# Patient Record
Sex: Female | Born: 1945 | Race: White | Hispanic: No | Smoking: Never smoker
Health system: Southern US, Community
[De-identification: ages and names within clinical notes are randomized; demographics above are authoritative.]

## PROBLEM LIST (undated history)

## (undated) DIAGNOSIS — T8859XA Other complications of anesthesia, initial encounter: Secondary | ICD-10-CM

## (undated) DIAGNOSIS — K5790 Diverticulosis of intestine, part unspecified, without perforation or abscess without bleeding: Secondary | ICD-10-CM

## (undated) DIAGNOSIS — M79605 Pain in left leg: Secondary | ICD-10-CM

## (undated) DIAGNOSIS — M1990 Unspecified osteoarthritis, unspecified site: Secondary | ICD-10-CM

## (undated) DIAGNOSIS — Z9889 Other specified postprocedural states: Secondary | ICD-10-CM

## (undated) DIAGNOSIS — I8390 Asymptomatic varicose veins of unspecified lower extremity: Secondary | ICD-10-CM

## (undated) DIAGNOSIS — E785 Hyperlipidemia, unspecified: Secondary | ICD-10-CM

## (undated) DIAGNOSIS — M79604 Pain in right leg: Secondary | ICD-10-CM

## (undated) DIAGNOSIS — R112 Nausea with vomiting, unspecified: Secondary | ICD-10-CM

## (undated) DIAGNOSIS — C801 Malignant (primary) neoplasm, unspecified: Secondary | ICD-10-CM

## (undated) HISTORY — DX: Hyperlipidemia, unspecified: E78.5

## (undated) HISTORY — DX: Pain in right leg: M79.605

## (undated) HISTORY — DX: Pain in left leg: M79.604

## (undated) HISTORY — DX: Malignant (primary) neoplasm, unspecified: C80.1

## (undated) HISTORY — DX: Asymptomatic varicose veins of unspecified lower extremity: I83.90

## (undated) HISTORY — DX: Diverticulosis of intestine, part unspecified, without perforation or abscess without bleeding: K57.90

---

## 1997-12-21 ENCOUNTER — Other Ambulatory Visit: Admission: RE | Admit: 1997-12-21 | Discharge: 1997-12-21 | Payer: Self-pay | Admitting: Gynecology

## 1998-04-01 ENCOUNTER — Other Ambulatory Visit: Admission: RE | Admit: 1998-04-01 | Discharge: 1998-04-01 | Payer: Self-pay | Admitting: Gynecology

## 1999-03-03 ENCOUNTER — Other Ambulatory Visit: Admission: RE | Admit: 1999-03-03 | Discharge: 1999-03-03 | Payer: Self-pay | Admitting: Gynecology

## 1999-03-04 ENCOUNTER — Ambulatory Visit (HOSPITAL_COMMUNITY): Admission: RE | Admit: 1999-03-04 | Discharge: 1999-03-04 | Payer: Self-pay | Admitting: Gynecology

## 1999-03-04 ENCOUNTER — Encounter (INDEPENDENT_AMBULATORY_CARE_PROVIDER_SITE_OTHER): Payer: Self-pay | Admitting: Specialist

## 1999-09-08 ENCOUNTER — Other Ambulatory Visit: Admission: RE | Admit: 1999-09-08 | Discharge: 1999-09-08 | Payer: Self-pay | Admitting: Gynecology

## 2000-03-08 ENCOUNTER — Other Ambulatory Visit: Admission: RE | Admit: 2000-03-08 | Discharge: 2000-03-08 | Payer: Self-pay | Admitting: Gynecology

## 2000-05-22 HISTORY — PX: ABDOMINAL HYSTERECTOMY: SHX81

## 2000-08-01 ENCOUNTER — Encounter (INDEPENDENT_AMBULATORY_CARE_PROVIDER_SITE_OTHER): Payer: Self-pay

## 2000-08-01 ENCOUNTER — Other Ambulatory Visit: Admission: RE | Admit: 2000-08-01 | Discharge: 2000-08-01 | Payer: Self-pay | Admitting: Gynecology

## 2000-08-31 ENCOUNTER — Encounter (INDEPENDENT_AMBULATORY_CARE_PROVIDER_SITE_OTHER): Payer: Self-pay | Admitting: Specialist

## 2000-08-31 ENCOUNTER — Inpatient Hospital Stay (HOSPITAL_COMMUNITY): Admission: RE | Admit: 2000-08-31 | Discharge: 2000-09-02 | Payer: Self-pay | Admitting: Gynecology

## 2001-03-14 ENCOUNTER — Other Ambulatory Visit: Admission: RE | Admit: 2001-03-14 | Discharge: 2001-03-14 | Payer: Self-pay | Admitting: Gynecology

## 2001-08-20 DIAGNOSIS — C801 Malignant (primary) neoplasm, unspecified: Secondary | ICD-10-CM

## 2001-08-20 HISTORY — DX: Malignant (primary) neoplasm, unspecified: C80.1

## 2001-09-16 ENCOUNTER — Encounter: Payer: Self-pay | Admitting: General Surgery

## 2001-09-18 ENCOUNTER — Encounter (INDEPENDENT_AMBULATORY_CARE_PROVIDER_SITE_OTHER): Payer: Self-pay | Admitting: *Deleted

## 2001-09-18 ENCOUNTER — Inpatient Hospital Stay (HOSPITAL_COMMUNITY): Admission: RE | Admit: 2001-09-18 | Discharge: 2001-09-20 | Payer: Self-pay | Admitting: General Surgery

## 2001-09-18 ENCOUNTER — Encounter: Payer: Self-pay | Admitting: General Surgery

## 2001-09-18 HISTORY — PX: MASTECTOMY: SHX3

## 2002-10-23 ENCOUNTER — Encounter: Payer: Self-pay | Admitting: Gastroenterology

## 2002-10-23 ENCOUNTER — Ambulatory Visit (HOSPITAL_COMMUNITY): Admission: RE | Admit: 2002-10-23 | Discharge: 2002-10-23 | Payer: Self-pay | Admitting: Gastroenterology

## 2002-10-23 ENCOUNTER — Encounter (INDEPENDENT_AMBULATORY_CARE_PROVIDER_SITE_OTHER): Payer: Self-pay

## 2002-12-12 ENCOUNTER — Encounter: Payer: Self-pay | Admitting: Internal Medicine

## 2002-12-25 ENCOUNTER — Ambulatory Visit (HOSPITAL_COMMUNITY): Admission: RE | Admit: 2002-12-25 | Discharge: 2002-12-25 | Payer: Self-pay | Admitting: Gastroenterology

## 2002-12-25 ENCOUNTER — Encounter: Payer: Self-pay | Admitting: Gastroenterology

## 2004-09-15 ENCOUNTER — Ambulatory Visit: Payer: Self-pay | Admitting: Internal Medicine

## 2004-09-30 ENCOUNTER — Ambulatory Visit: Payer: Self-pay | Admitting: Internal Medicine

## 2004-10-10 ENCOUNTER — Ambulatory Visit: Payer: Self-pay | Admitting: Cardiology

## 2004-10-10 IMAGING — CT CT CHEST W/O CM
1 of 2 series · 15 of 32 positions shown, 19 images · non-contrast
Comparison: none

CLINICAL DATA: Cough for the past month. Shortness of breath. Clinical
diagnosis of bronchitis and pneumonia.

CHEST CT WITHOUT CONTRAST

[Series 2: chest_routine 5.0 b40f st · axial · 0.67mm/px · z∈[-263,-53]mm · 15 of 50 slices shown, 19 images]
[im 4/50  mediastinal]
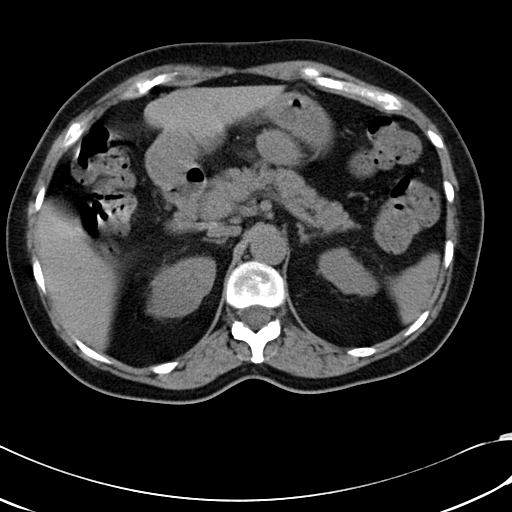
[im 4/50  lung]
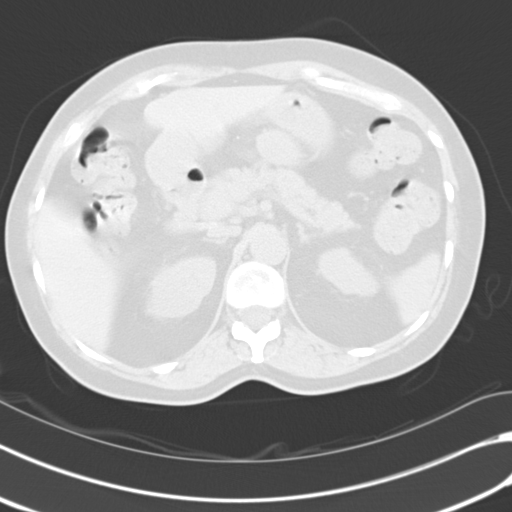
[im 7/50  lung]
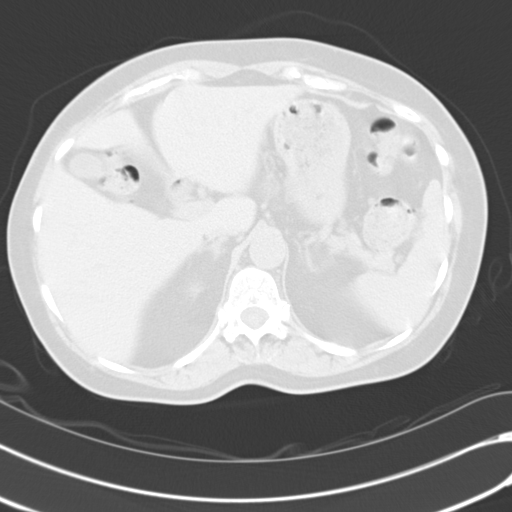
[im 10/50  lung]
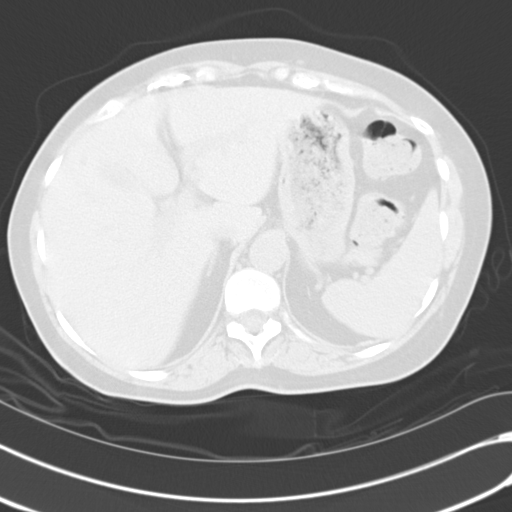
[im 14/50  lung]
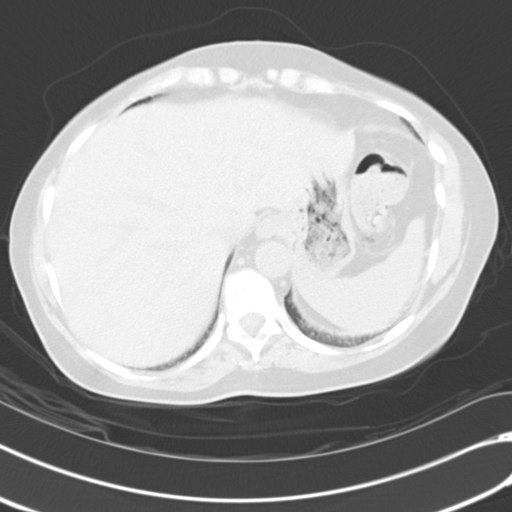
[im 17/50  mediastinal]
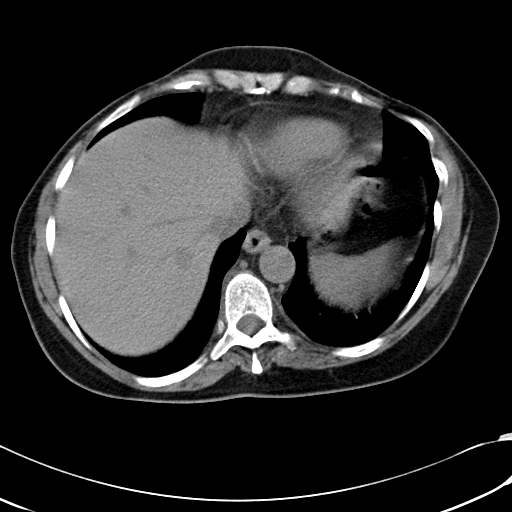
[im 17/50  lung]
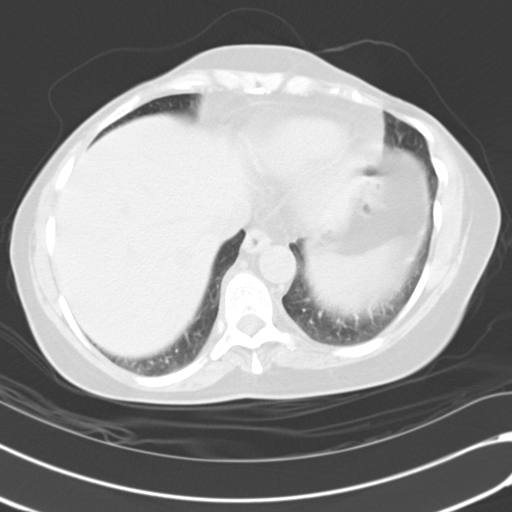
[im 20/50  lung]
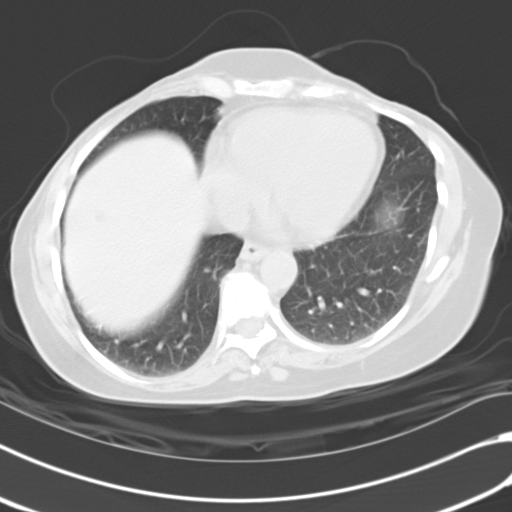
[im 23/50  lung]
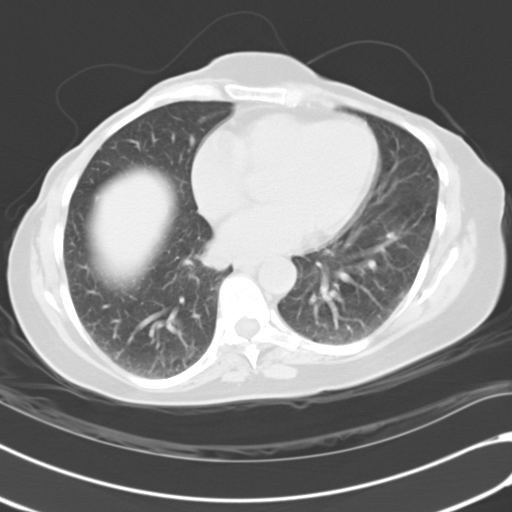
[im 25/50  lung]
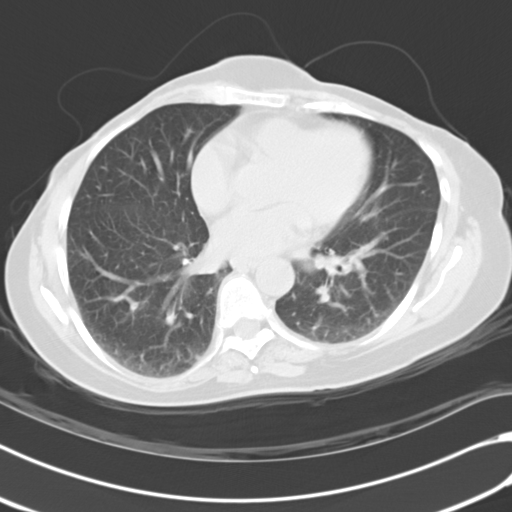
[im 27/50  mediastinal]
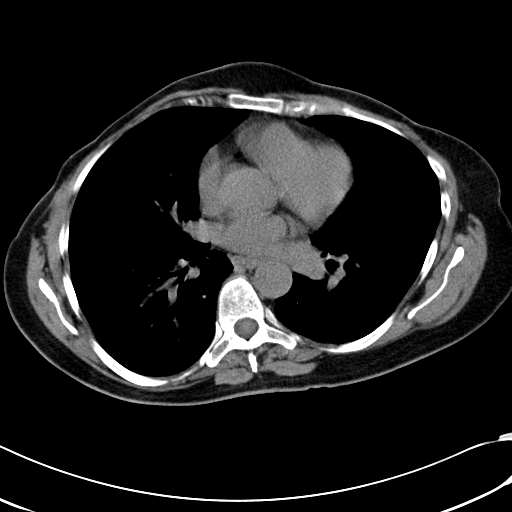
[im 27/50  lung]
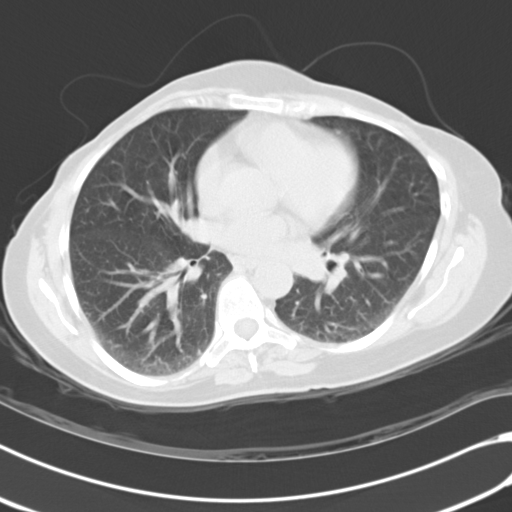
[im 30/50  lung]
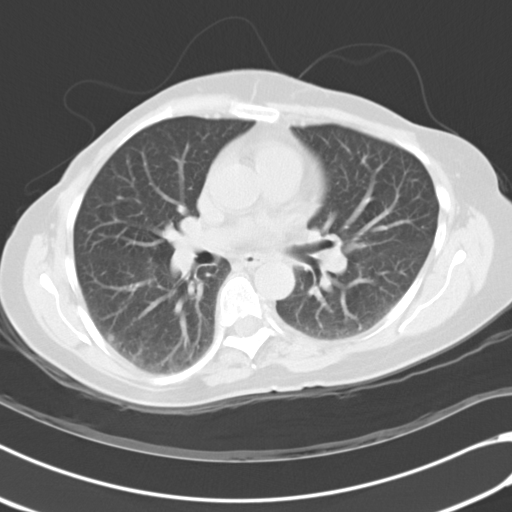
[im 33/50  lung]
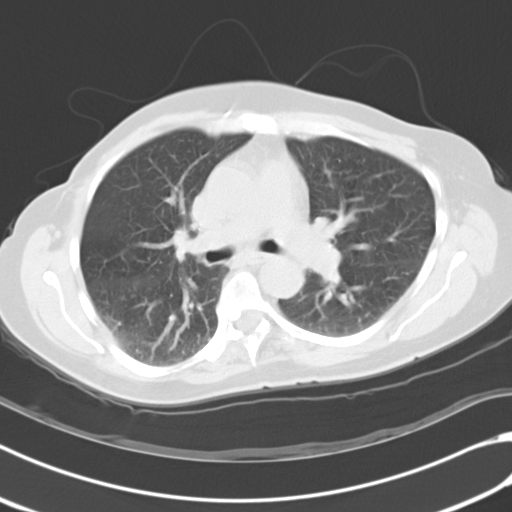
[im 36/50  lung]
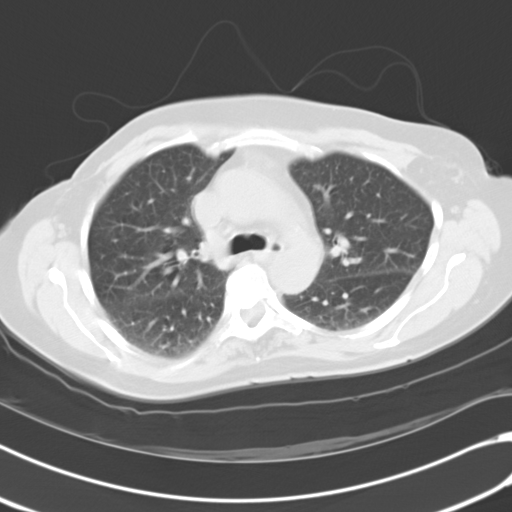
[im 40/50  mediastinal]
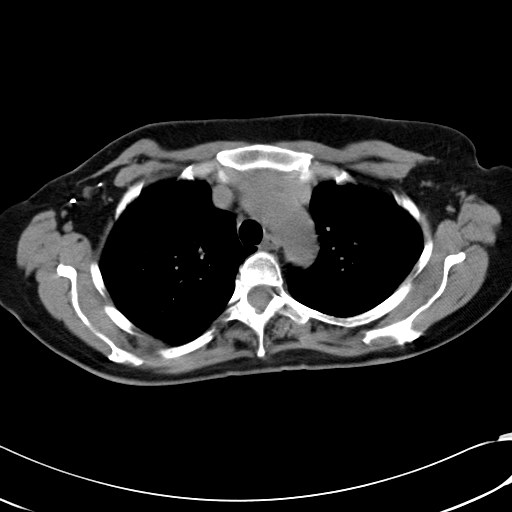
[im 40/50  lung]
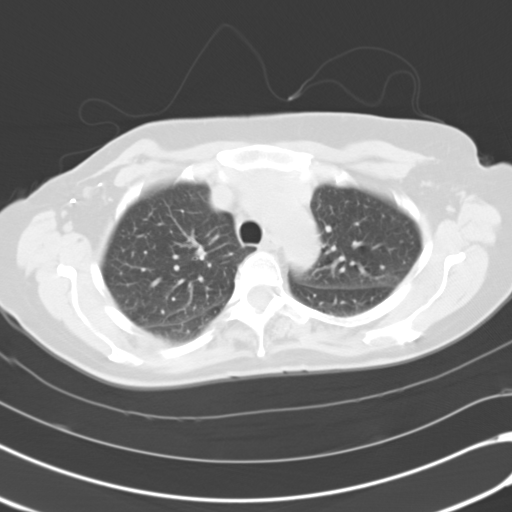
[im 43/50  lung]
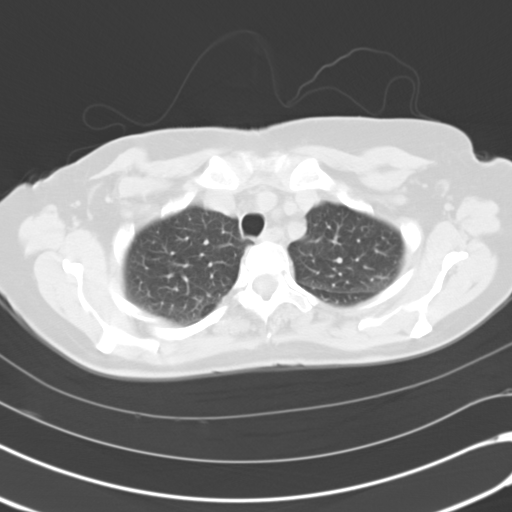
[im 46/50  lung]
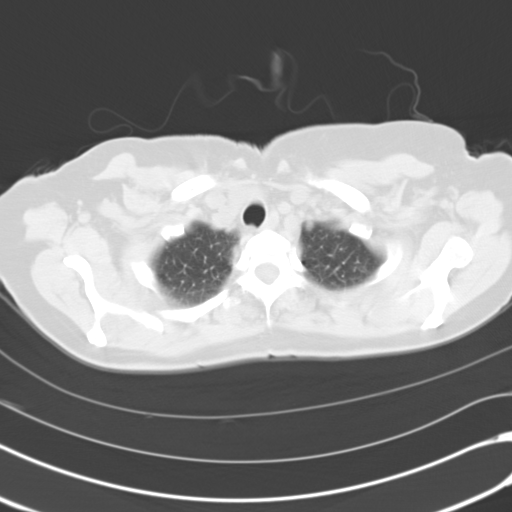

[15 of 32 positions shown; findings below may reference images not displayed]

FINDINGS: No areas of airspace consolidation. 5 mm noncalcified nodule in the
periphery of the right lower lobe. No enlarged lymph nodes. 8mm left thyroid
nodule. 1.3 cm mass in the upper liver on the right. Additional 9 mm mass in the
dome of the liver on the right. Additional 6 mm mass the liver adjacent to the
gallbladder fundus. 2.8 x 1.6 cm upper pole left renal mass, seen on the last
image, not included in its entirety. 12 x 5 mm calcification posterior to the T5
vertebral body. This appears to represent an inferiorly migrated calcified disc
herniation arising from the T4-T5 disc. A small to1 moderate sized calcified
disc herniation on the right at the T5-T6 level. Three small posterior calcified
disc herniations at the T6-T7 level, one centrally, one on the left and one on
the right. Small calcified disc herniation on the left at the T7-T8 level. Small
central calcified disc herniation at the T8-T9 level. Mild scoliosis.

IMPRESSION

1. Left renal mass and three liver masses. These are incompletely characterized
on this noncontrast CT examination.A CT of the abdomen and pelvis, with
intravenous and oral contrast, is recommended.

2. 5 mm noncalcified right lower lobe nodule. This most likely represents a
noncalcified granuloma. As a precaution, a followup chest CT is recommended in 3
months to assess stability.

3. 8mm nonspecific left lobe thyroid nodule. A baseline thyroid ultrasound is
recommended. 

4. No pneumonia seen. 

5. Multiple thoracic disc herniations, as described above. The largest is
located centrally and on the right at the T4-T5 level.

## 2004-10-11 ENCOUNTER — Ambulatory Visit: Payer: Self-pay | Admitting: Internal Medicine

## 2004-10-11 IMAGING — CT CT PELVIS W/ CM
3 of 7 series · 12 of 32 positions shown, 17 images · IV contrast (omnipaque)
Comparison: CT of the chest from 10/10/04.

CLINICAL DATA: Breast ca; a recent CT of the chest suggested renal mass and possible liver lesions
TECHNIQUE: Originally, noncontrast CT images were obtained from the lung bases through the iliac crest.  Subsequently, intravenous administration of 100 cc of Omnipaque 300 IV contrast was performed and images were again obtained from the lung bases through the iliac crests.  Delayed phase images through the kidneys were also obtained.  
 CT ABDOMEN WITH AND WITHOUT CONTRAST:
TECHNIQUE: Contiguous axial CT images were obtained from the iliac crest to the proximal femurs.
 CT OF THE PELVIS WITH CONTRAST:
 There is some prominent posterior bony spurring at the L4-5 level.  No pelvic adenopathy is evident.  The urinary bladder is empty during imaging.  Visualized bowel appears unremarkable.  No free pelvic fluid.  Uterus is surgically absent.  Likewise the ovaries are not readily apparent.

[Series 2: non_con 5.0 b30f st · axial · 0.63mm/px · z∈[-200,-70]mm · 3 of 54 slices shown]
[im 14/54  soft-tissue]
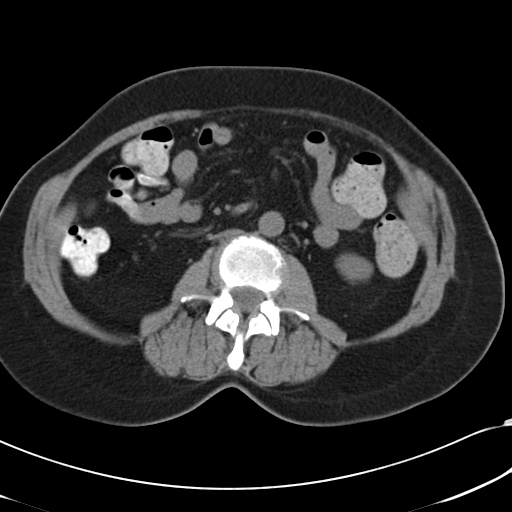
[im 27/54  soft-tissue]
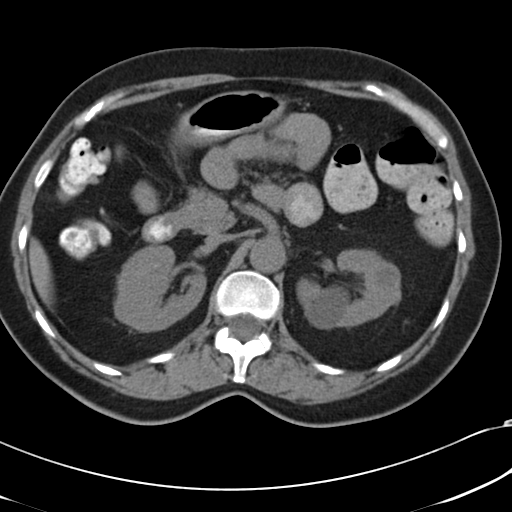
[im 40/54  soft-tissue]
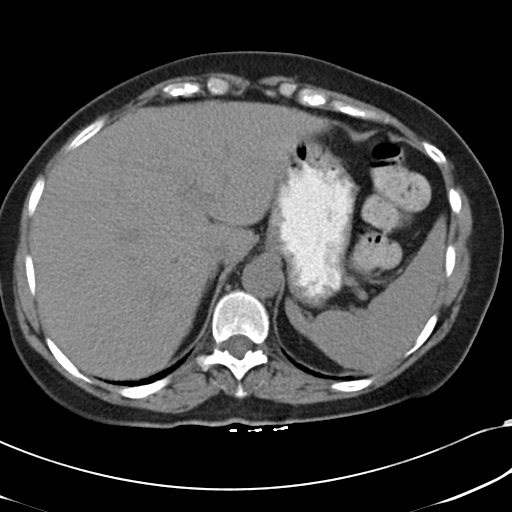

[Series 3: abd_pelvis 5.0 b30f st · axial · 0.63mm/px · z∈[-386,-56]mm · 7 of 88 slices shown, 12 images]
[im 11/88  soft-tissue]
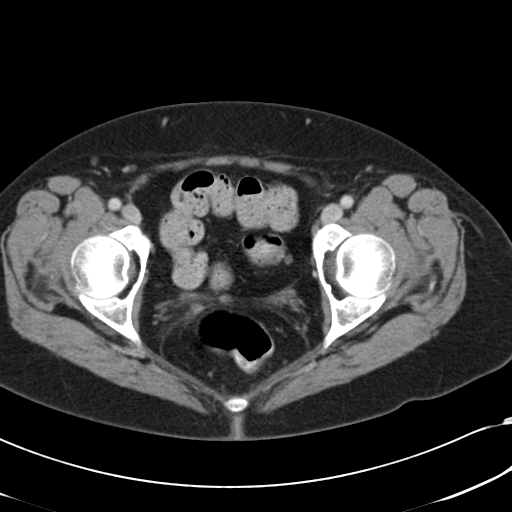
[im 11/88  bone]
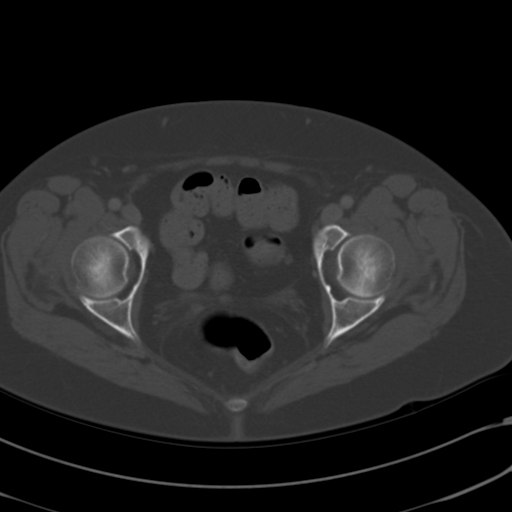
[im 22/88  soft-tissue]
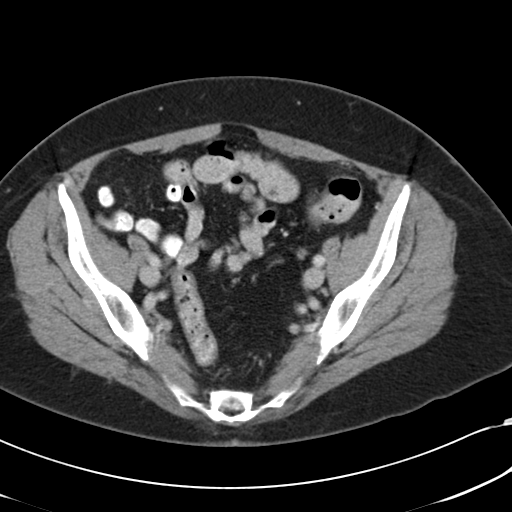
[im 33/88  soft-tissue]
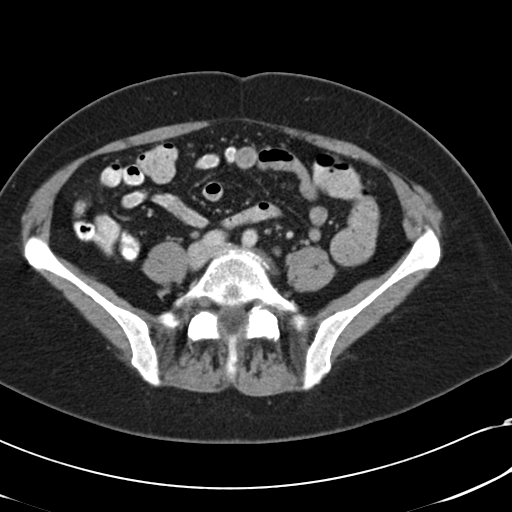
[im 44/88  soft-tissue]
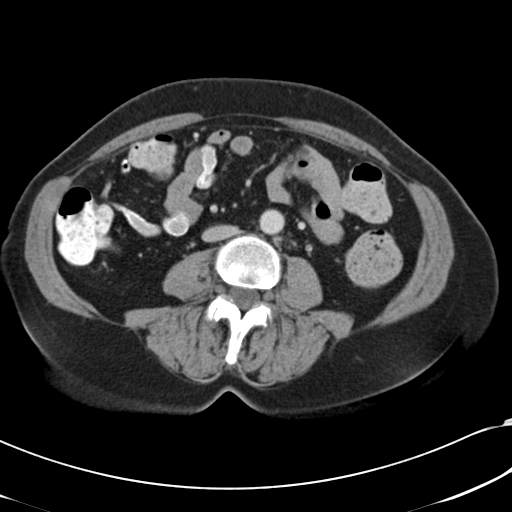
[im 44/88  lung]
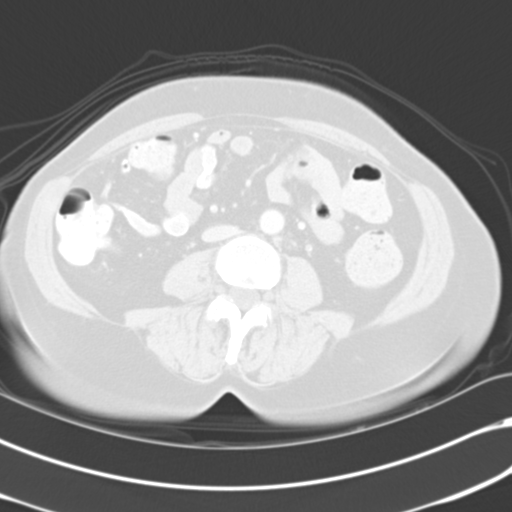
[im 55/88  soft-tissue]
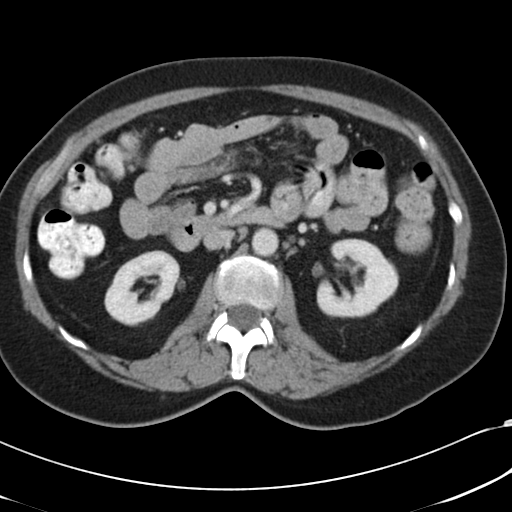
[im 55/88  lung]
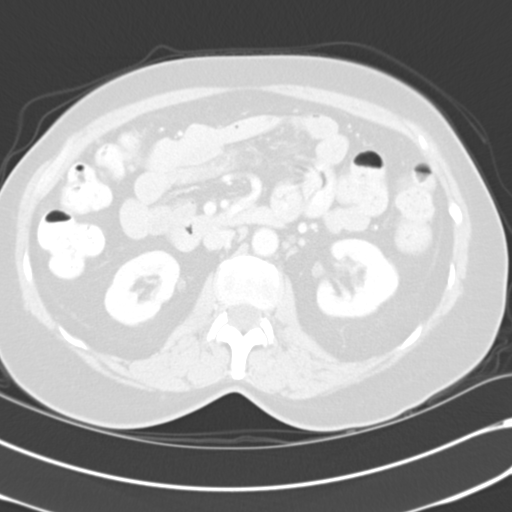
[im 66/88  soft-tissue]
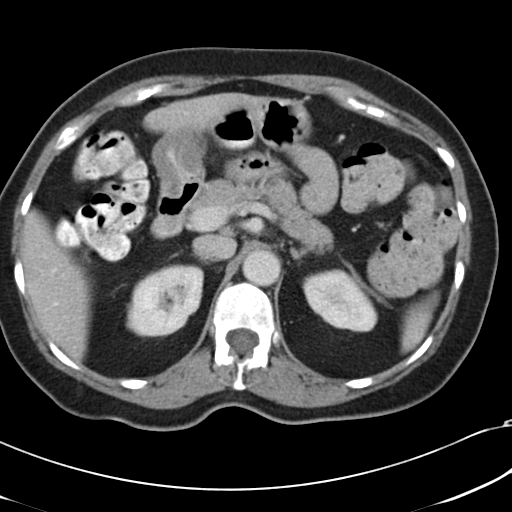
[im 66/88  lung]
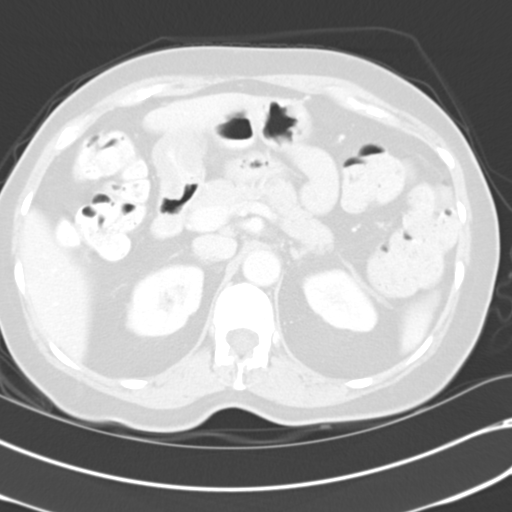
[im 77/88  soft-tissue]
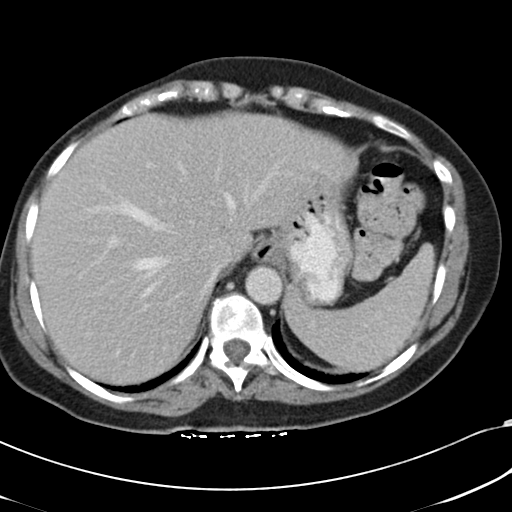
[im 77/88  lung]
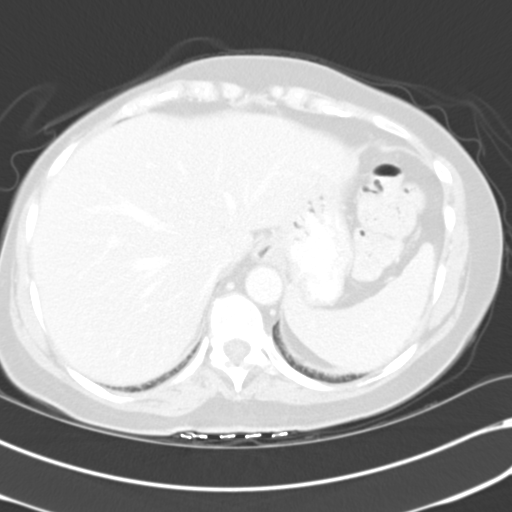

[Series 5: kidney_delay 5.0 b30f st · axial · 0.63mm/px · z∈[-150,-86]mm · 2 of 40 slices shown]
[im 14/40  soft-tissue]
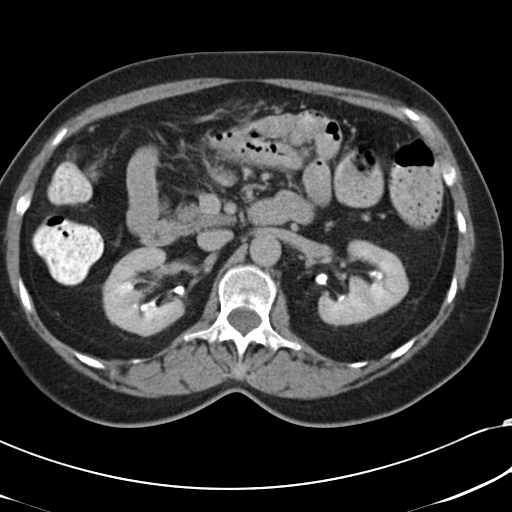
[im 27/40  soft-tissue]
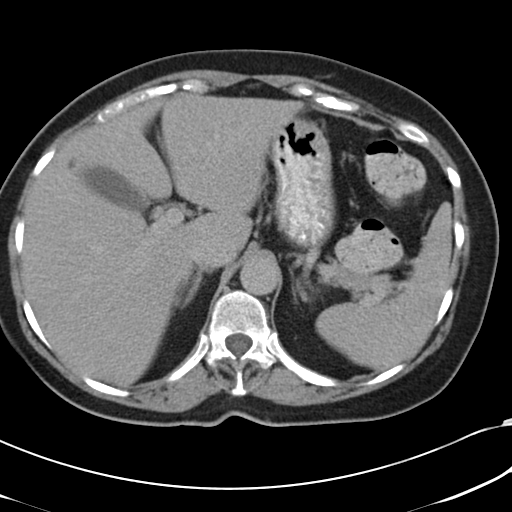

[12 of 32 positions shown; findings below may reference images not displayed]

In the dome of the right hepatic lobe, there is a 6 mm hypodense lesion with no associated enhancement, likely representing a cyst. 
 More posteriorly and as shown on image 8 of series 3, there is a 14 mm complex lesion in the right hepatic lobe measuring approximately 50 Hounsfield units on pre-contrast, portal venous phase, and delayed phase images.  Thus, this likely represents a complex cyst given the lack of obvious enhancement, although given the patient?s history of this lesion likely warrants being carefully followed. 
 The spleen and adrenal glands appear unremarkable.  
 The lesion of previous concern on the noncontrast chest CT in the left kidney is noted to represent a 3.4 x 2.1 cm simple cyst.  There is also a separate 1.6 x 1.2 cm cyst in the left kidney.  This lesion is relatively flat and is subject to volume averaging, although most likely represents a cyst based on density measurements.  
 The pancreas appears unremarkable.  No pathologic retroperitoneal adenopathy.  Visualized bowel likewise appears unremarkable.
IMPRESSION: 1.  Left renal lesions have benign imaging characteristics.
 2.  The liver lesions also have benign imaging characteristics.  One of these is a complex lesion, but does not appear to have significant difference in density on pre and post contrast images and thus likely represents a complex cyst.  This latter lesion warrants careful follow-up.
IMPRESSION: No acute pelvic findings.

## 2004-11-29 ENCOUNTER — Encounter (HOSPITAL_COMMUNITY): Admission: RE | Admit: 2004-11-29 | Discharge: 2005-02-27 | Payer: Self-pay | Admitting: Neurosurgery

## 2004-12-01 ENCOUNTER — Ambulatory Visit: Payer: Self-pay | Admitting: Internal Medicine

## 2004-12-13 ENCOUNTER — Encounter (INDEPENDENT_AMBULATORY_CARE_PROVIDER_SITE_OTHER): Payer: Self-pay | Admitting: *Deleted

## 2004-12-13 ENCOUNTER — Encounter: Admission: RE | Admit: 2004-12-13 | Discharge: 2004-12-13 | Payer: Self-pay | Admitting: Internal Medicine

## 2004-12-13 ENCOUNTER — Other Ambulatory Visit: Admission: RE | Admit: 2004-12-13 | Discharge: 2004-12-13 | Payer: Self-pay | Admitting: Interventional Radiology

## 2004-12-13 IMAGING — US US BIOPSY
1 series · 8 of 8 positions shown · non-contrast
Comparison: none

CLINICAL DATA: Dominant left lower pole thyroid nodule. History of breast
carcinoma.

ULTRASOUND GUIDED FNA BIOPSY THYROID:
TECHNIQUE: Overlying skin prepped with Betadine, draped in usual sterile
fashion, and infiltrated locally with 1% lidocaine. Under real-time ultrasound
guidance, 4 passes were made into the left lower pole nodule using 25 gauge
needles. Samples were given to cytopathology. No immediate complication.

[Series 1: unknown · 0.07mm/px · 8 of 8 slices shown]
[im 1/8]
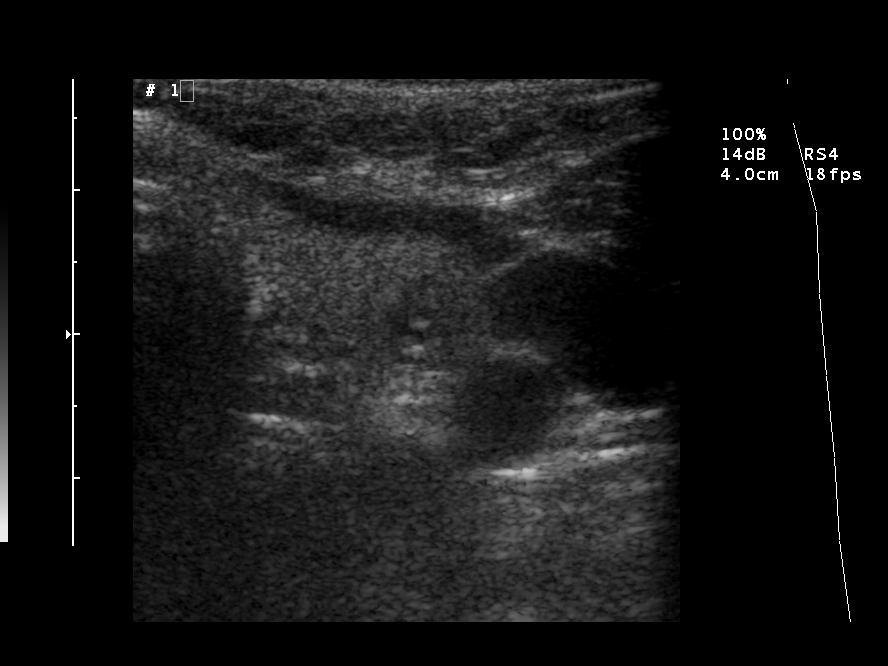
[im 2/8]
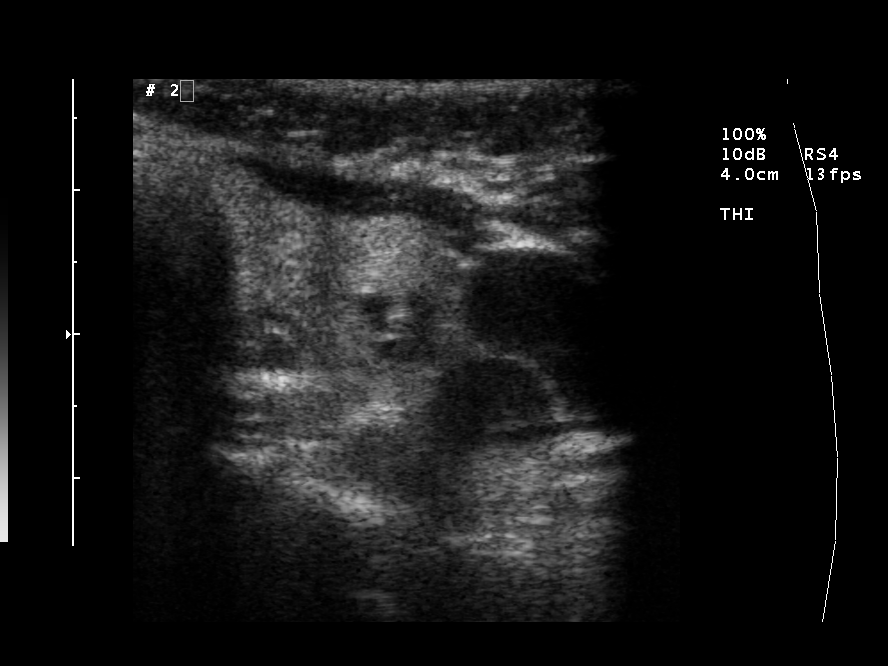
[im 3/8]
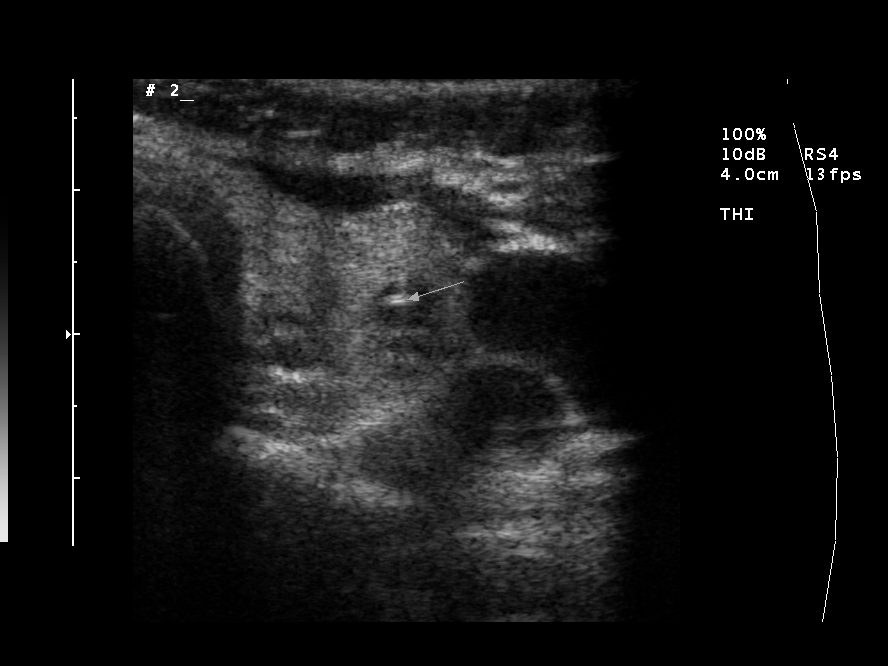
[im 4/8]
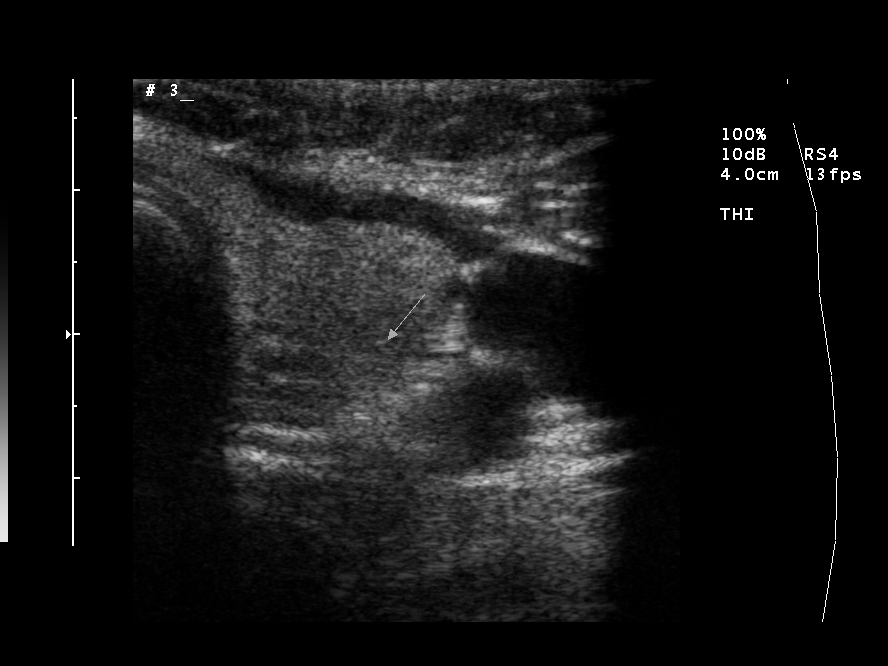
[im 5/8]
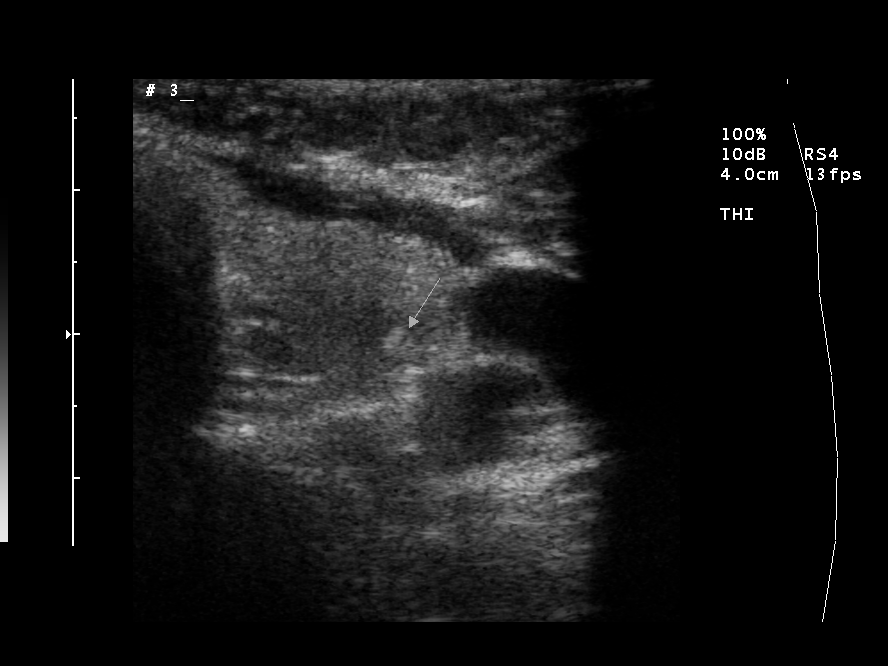
[im 6/8]
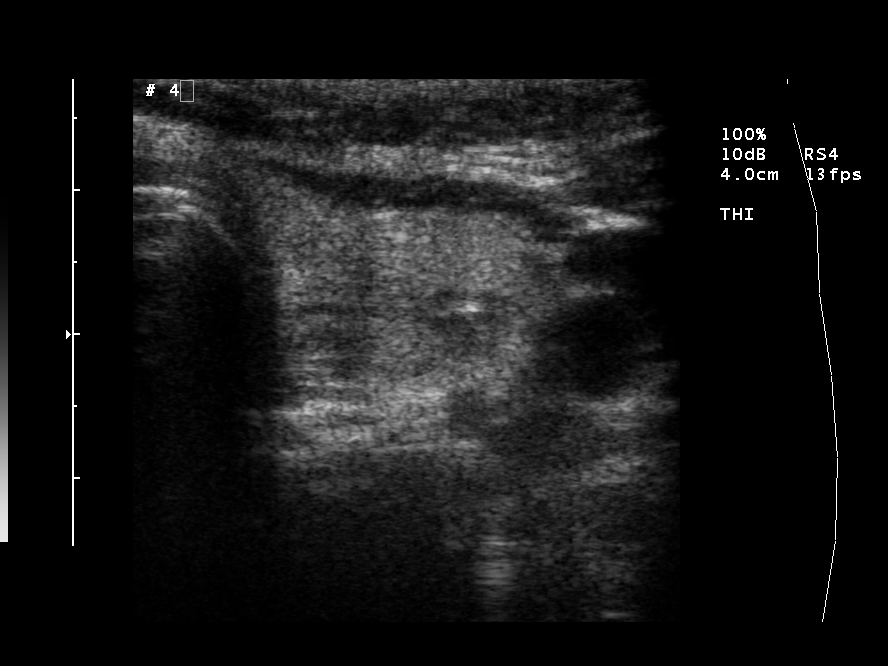
[im 7/8]
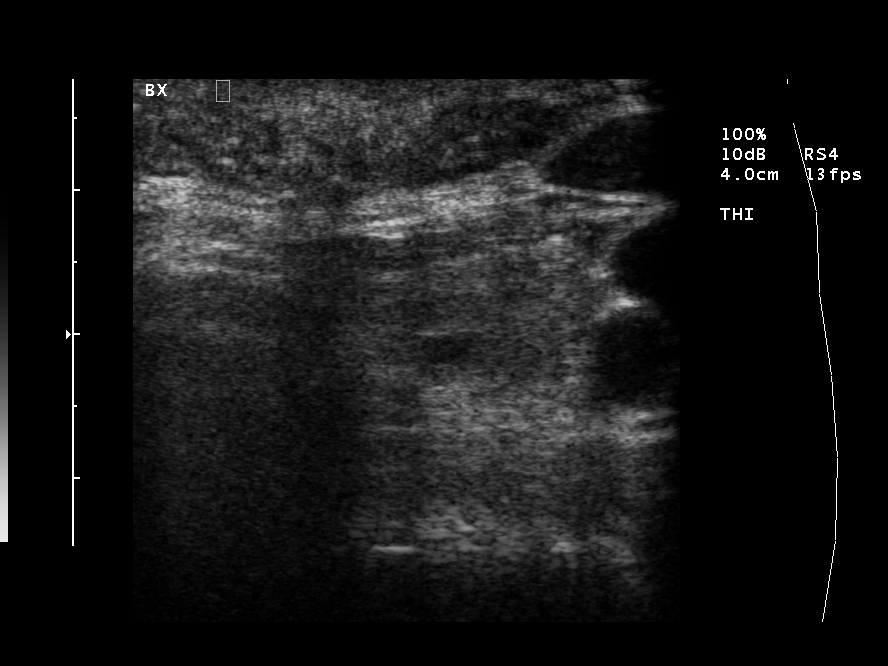
[im 8/8]
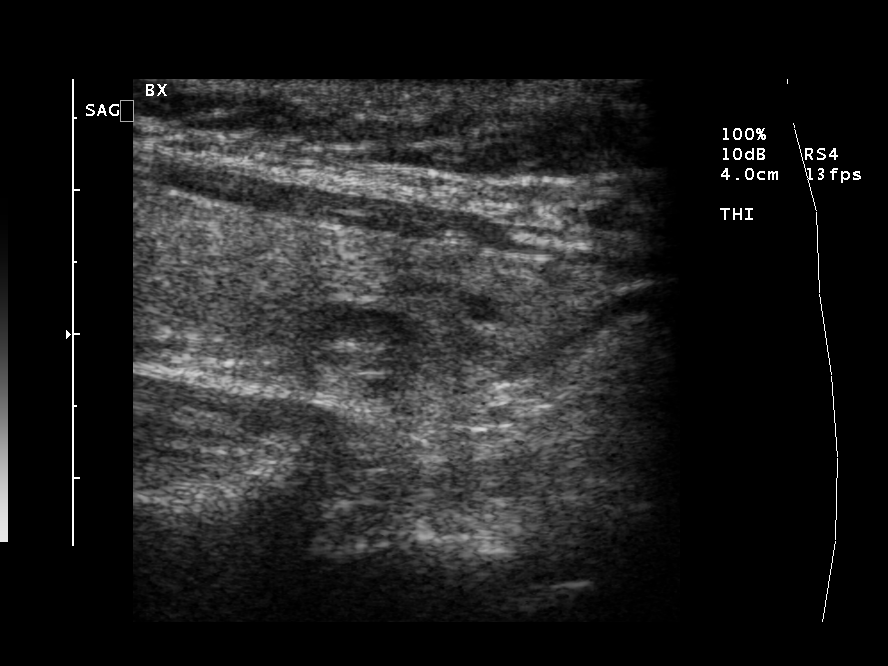

[8 of 8 positions shown; findings below may reference images not displayed]

IMPRESSION: Technically successful ultrasound guided FNA thyroid biopsy.

## 2005-02-23 ENCOUNTER — Ambulatory Visit: Payer: Self-pay | Admitting: Internal Medicine

## 2005-03-02 ENCOUNTER — Ambulatory Visit: Payer: Self-pay | Admitting: Internal Medicine

## 2005-03-06 ENCOUNTER — Ambulatory Visit: Payer: Self-pay | Admitting: Internal Medicine

## 2005-03-06 IMAGING — CT CT PELVIS W/ CM
1 of 3 series · 14 of 32 positions shown, 19 images · non-contrast
Comparison: none

CLINICAL DATA: Renal and hepatic cysts on previous study.

[Series 2: abd_pel 5.0 b30f st · axial · 0.68mm/px · z∈[-426,-26]mm · 14 of 90 slices shown, 19 images]
[im 5/90  soft-tissue]
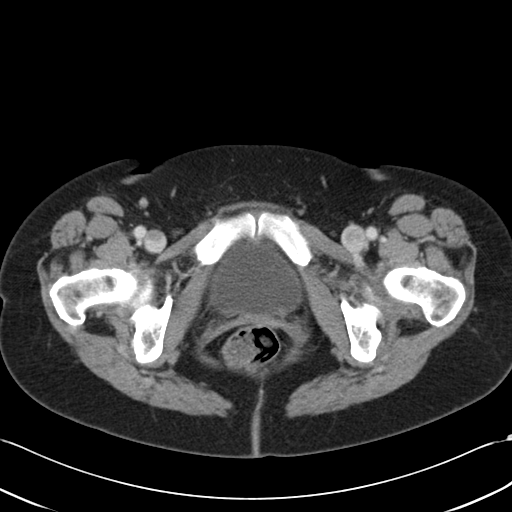
[im 5/90  bone]
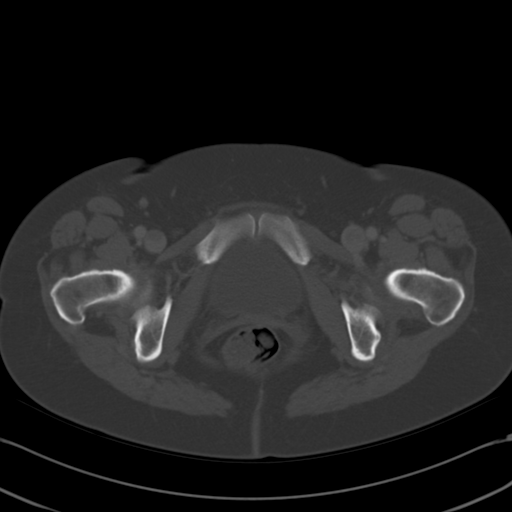
[im 15/90  soft-tissue]
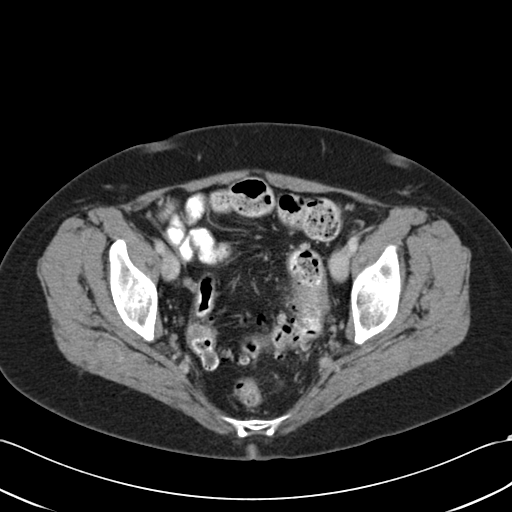
[im 19/90  soft-tissue]
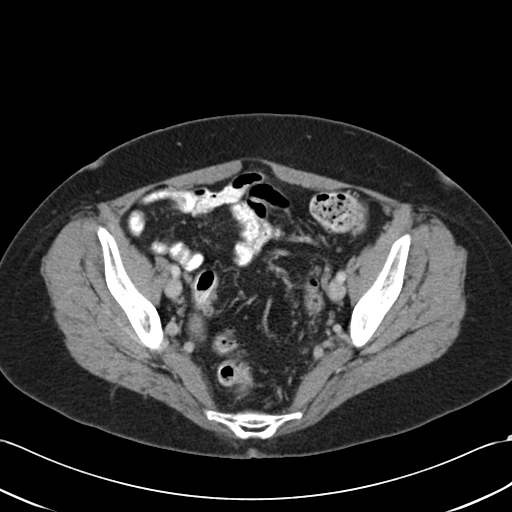
[im 24/90  soft-tissue]
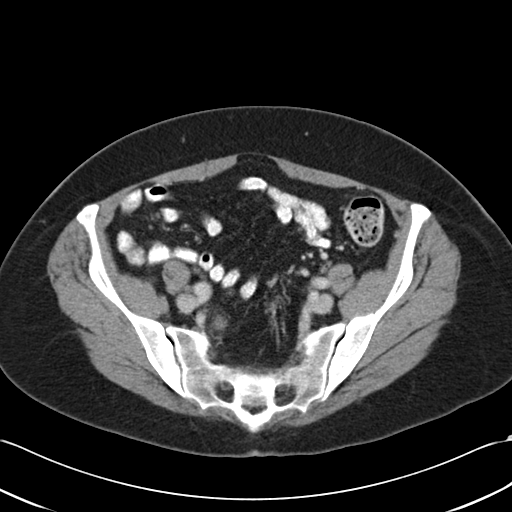
[im 33/90  soft-tissue]
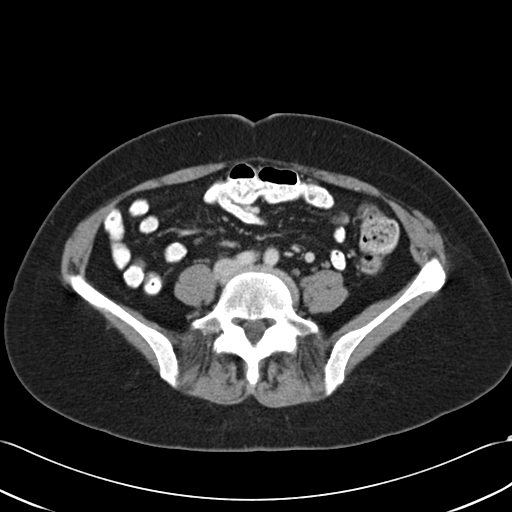
[im 38/90  soft-tissue]
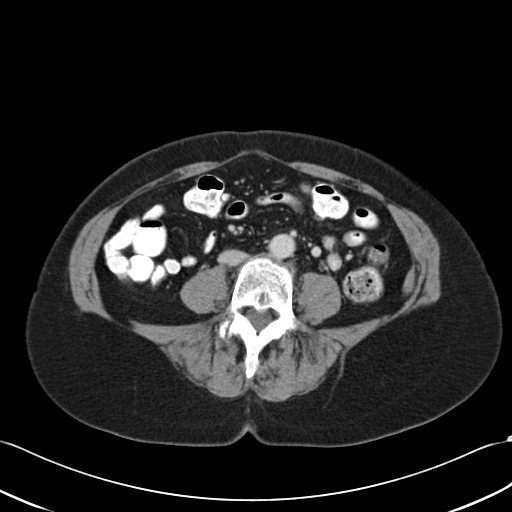
[im 47/90  soft-tissue]
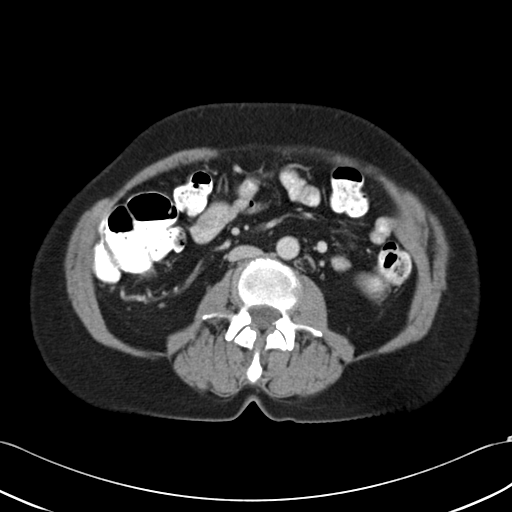
[im 52/90  soft-tissue]
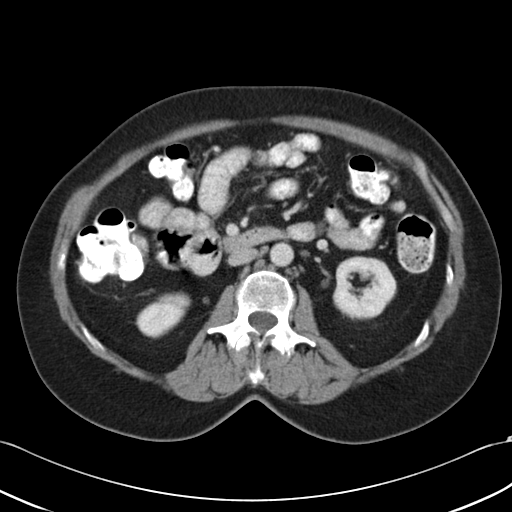
[im 57/90  soft-tissue]
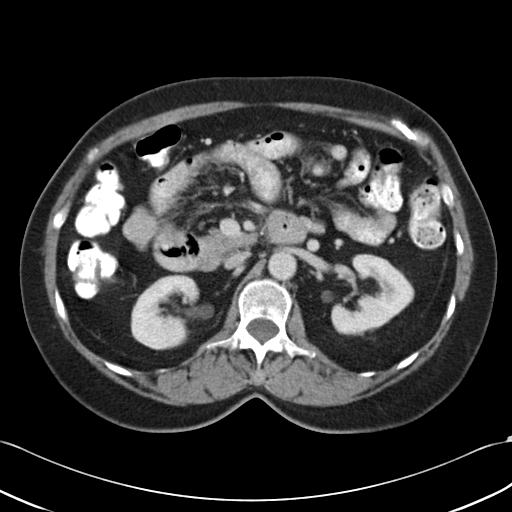
[im 57/90  bone]
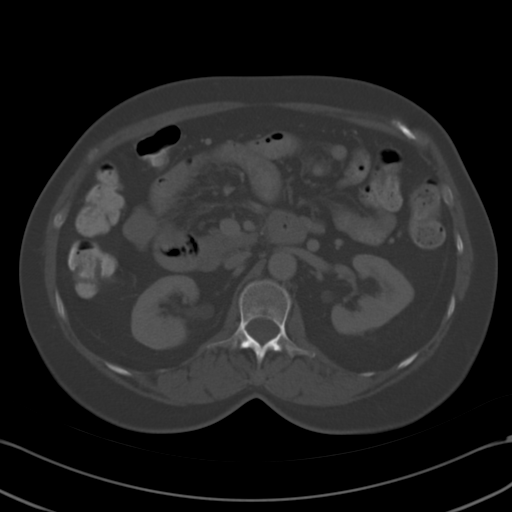
[im 66/90  soft-tissue]
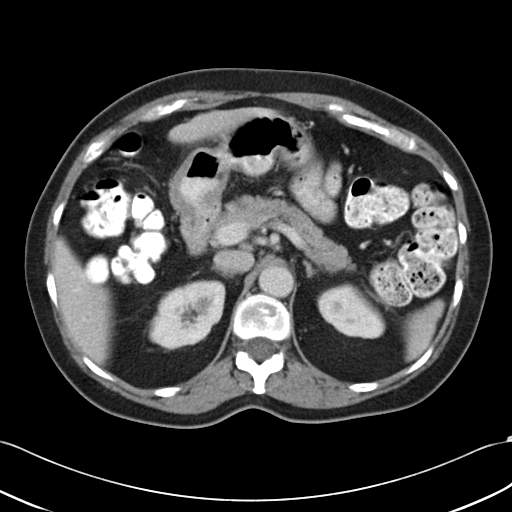
[im 71/90  soft-tissue]
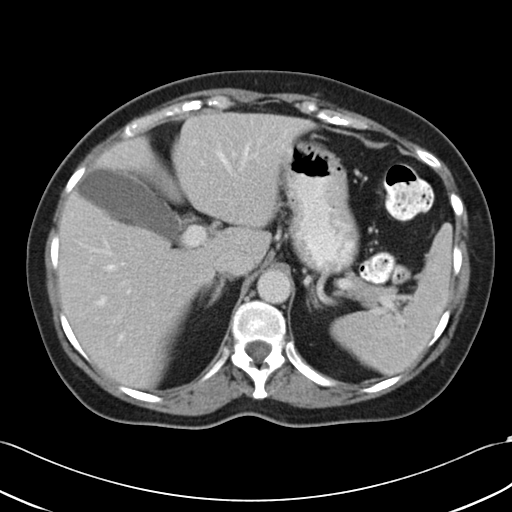
[im 71/90  lung]
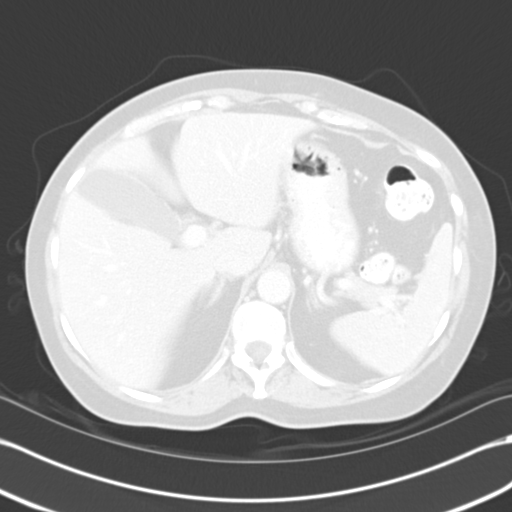
[im 75/90  soft-tissue]
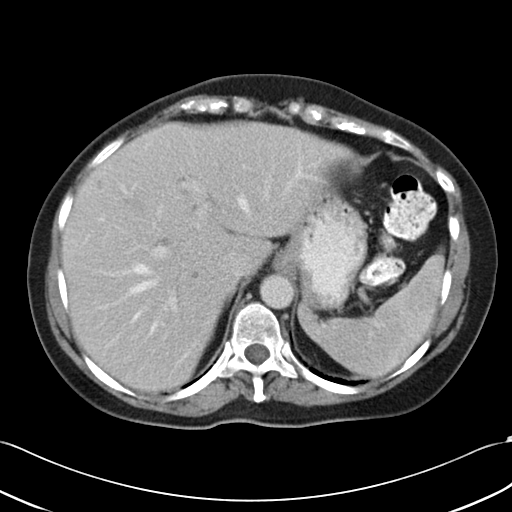
[im 75/90  lung]
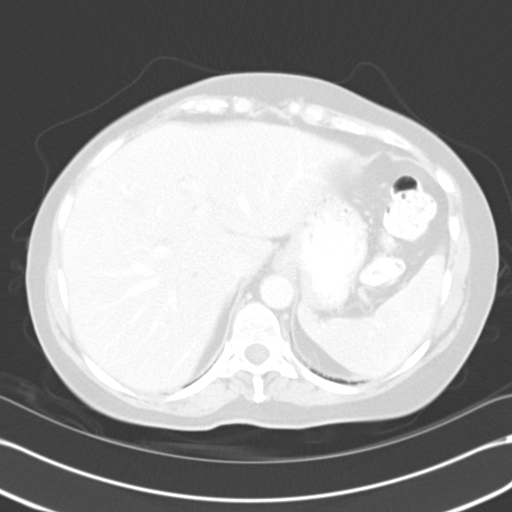
[im 80/90  lung]
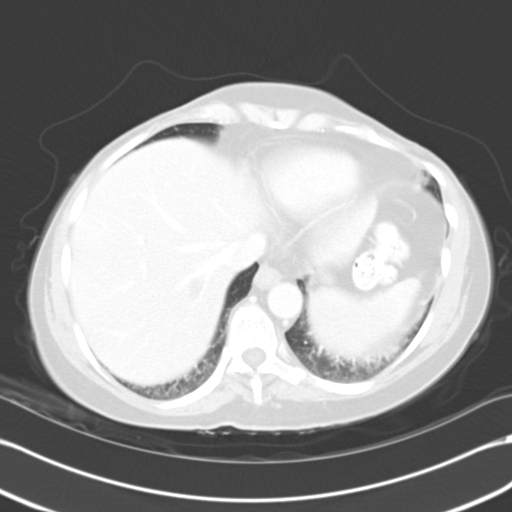
[im 85/90  soft-tissue]
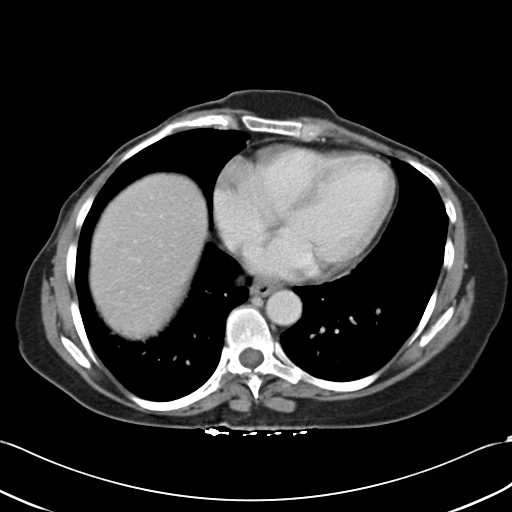
[im 85/90  lung]
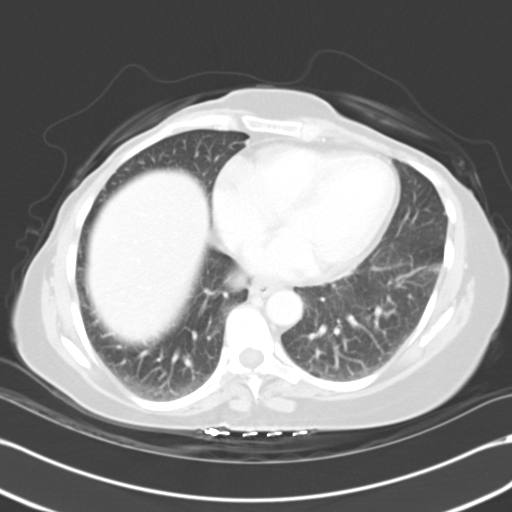

[14 of 32 positions shown; findings below may reference images not displayed]

CT abdomen with contrast:

Multidetector helical CT after 100 ml Gmnipaque-6JJ IV.

Comparison 10/11/2004. [DATE] mm subpleural nodule in the lateral basal segment left
lower lobe stable. Multiple low attenuation hepatic lesions stable in size,  
number and appearance. Largest is in the posterior right hepatic segment
measuring 15 millimeters in maximum   transverse diameter. No new lesions are
seen. Unremarkable spleen, adrenal glands, pancreas, gallbladder. Left renal
cysts, largest 3.4 cm diameter in the interpolar region, stable. No
hydronephrosis. Aorta normal in caliber. Subcentimeter left periaortic and
aortocaval lymph nodes. Portal vein patent. Small bowel unremarkable. No free
air. No ascites. Degenerative changes in the lower lumbar spine.
IMPRESSION: 1. Stable hepatic and renal cysts.
2. Stable subcentimeter subpleural nodule in the lateral basal segment left
lower lobe.

CT pelvis with contrast:

Colon is nondistended. Scattered diverticula from distal descending and sigmoid
portions of colon. No extraluminal fluid collections are identified. No free
fluid. Urinary bladder physiologically distended. Uterus surgically absent.
IMPRESSION: 1. Colonic diverticulosis. 
2. Otherwise unremarkable CT pelvis

## 2005-03-16 ENCOUNTER — Ambulatory Visit: Payer: Self-pay | Admitting: Family Medicine

## 2005-04-27 ENCOUNTER — Ambulatory Visit: Payer: Self-pay | Admitting: Internal Medicine

## 2005-05-04 ENCOUNTER — Ambulatory Visit: Payer: Self-pay | Admitting: Internal Medicine

## 2005-07-06 ENCOUNTER — Ambulatory Visit: Payer: Self-pay | Admitting: Internal Medicine

## 2005-09-14 ENCOUNTER — Ambulatory Visit: Payer: Self-pay | Admitting: Internal Medicine

## 2006-01-04 ENCOUNTER — Ambulatory Visit: Payer: Self-pay | Admitting: Internal Medicine

## 2006-01-15 ENCOUNTER — Ambulatory Visit: Payer: Self-pay | Admitting: Internal Medicine

## 2006-02-15 ENCOUNTER — Ambulatory Visit: Payer: Self-pay | Admitting: Internal Medicine

## 2006-03-08 ENCOUNTER — Ambulatory Visit: Payer: Self-pay | Admitting: Internal Medicine

## 2006-03-08 LAB — CONVERTED CEMR LAB
ALT: 8 units/L (ref 0–40)
Albumin: 3.6 g/dL (ref 3.5–5.2)
Alkaline Phosphatase: 65 units/L (ref 39–117)
Basophils Relative: 0.7 % (ref 0.0–1.0)
Cholesterol: 167 mg/dL (ref 0–200)
GFR calc non Af Amer: 78 mL/min
Glomerular Filtration Rate, Af Am: 94 mL/min/{1.73_m2}
Glucose, Bld: 97 mg/dL (ref 70–99)
LDL Cholesterol: 106 mg/dL — ABNORMAL HIGH (ref 0–99)
Platelets: 310 10*3/uL (ref 150–400)
Potassium: 4.7 meq/L (ref 3.5–5.1)
RBC: 4.62 M/uL (ref 3.87–5.11)
Sodium: 147 meq/L — ABNORMAL HIGH (ref 135–145)
Total Bilirubin: 0.6 mg/dL (ref 0.3–1.2)
Total Protein: 6 g/dL (ref 6.0–8.3)
VLDL: 13 mg/dL (ref 0–40)
WBC: 5.2 10*3/uL (ref 4.5–10.5)

## 2006-03-15 ENCOUNTER — Ambulatory Visit: Payer: Self-pay | Admitting: Internal Medicine

## 2006-03-16 ENCOUNTER — Ambulatory Visit: Payer: Self-pay | Admitting: Internal Medicine

## 2006-03-16 IMAGING — CT CT PELVIS W/ CM
2 of 5 series · 16 of 46 positions shown, 18 images · IV contrast (omnipaque)
Comparison: CT chest dated 10/14/04.
COMPARISON: 03/06/05.

CLINICAL DATA: 60 year-old with history of breast cancer. Evaluate for renal mass and possible liver lesion.
CHEST CT WITH CONTRAST:
TECHNIQUE: Multidetector CT imaging of the chest was performed following the standard protocol during bolus administration of intravenous contrast.
Contrast:  125 cc Omnipaque 300
TECHNIQUE: Multidetector CT imaging of the abdomen was performed following the standard protocol during bolus administration of intravenous contrast.
TECHNIQUE: Multidetector CT imaging of the pelvis was performed following the standard protocol during bolus administration of intravenous contrast.

[Series 2: cap 5.0 b40f st · axial · 0.73mm/px · z∈[-598,-74]mm · 13 of 119 slices shown, 15 images]
[im 7/119  soft-tissue]
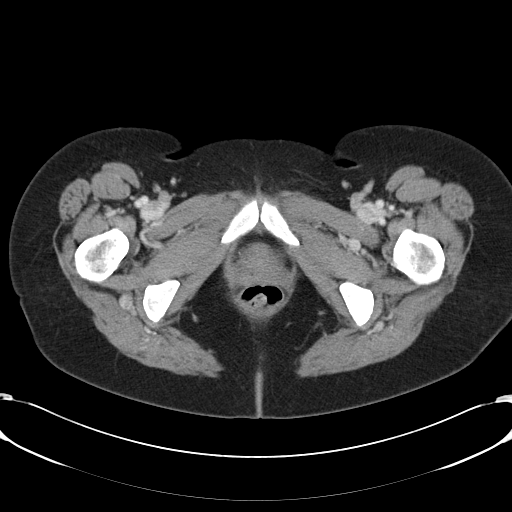
[im 7/119  bone]
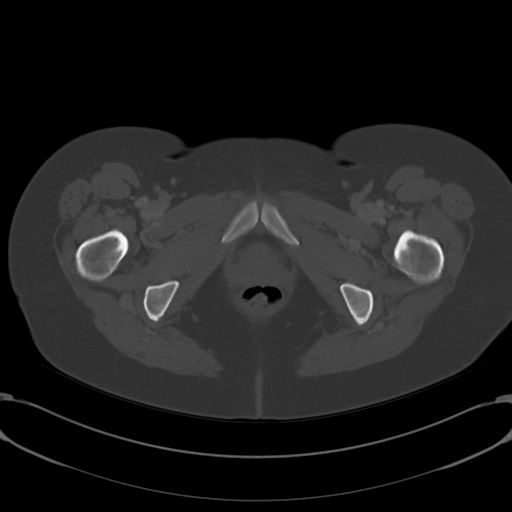
[im 14/119  soft-tissue]
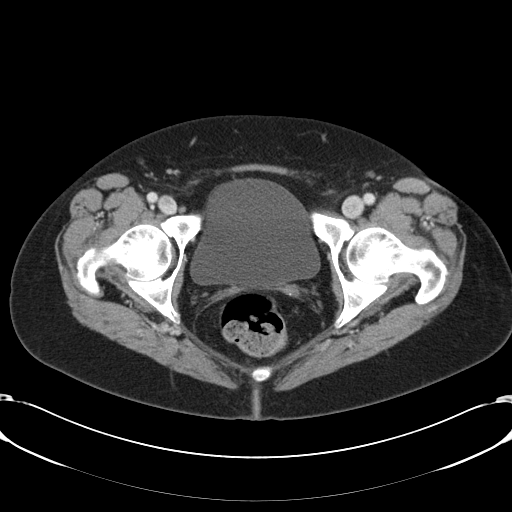
[im 27/119  soft-tissue]
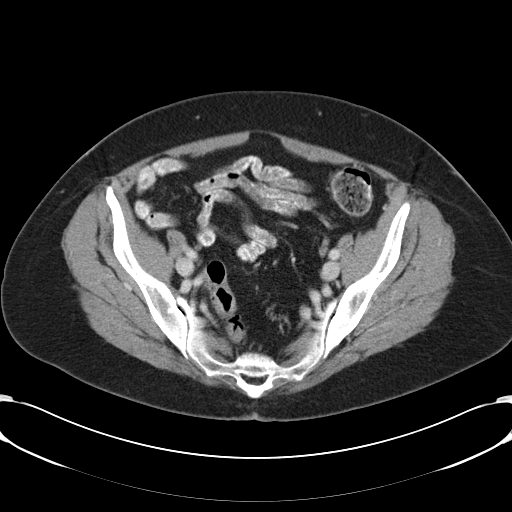
[im 33/119  soft-tissue]
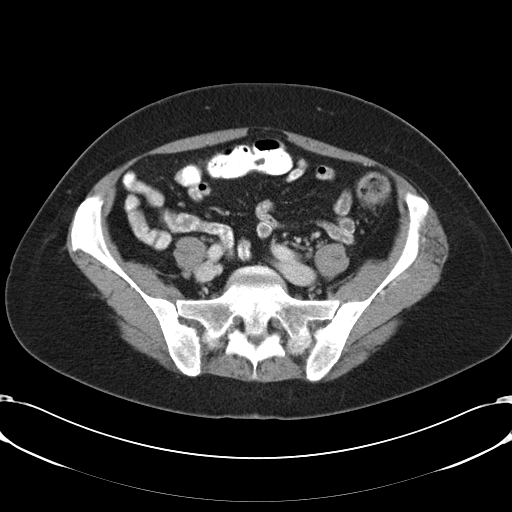
[im 40/119  soft-tissue]
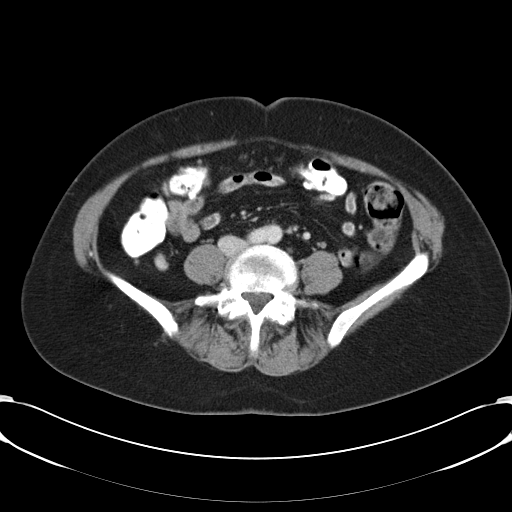
[im 53/119  soft-tissue]
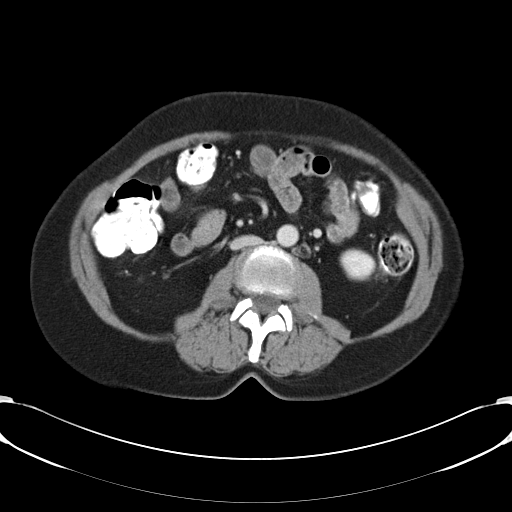
[im 60/119  soft-tissue]
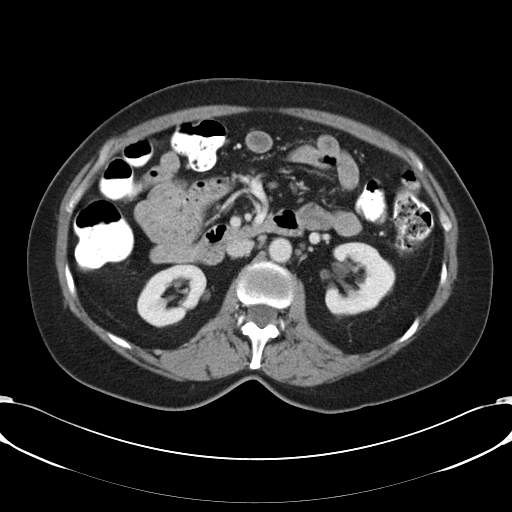
[im 66/119  soft-tissue]
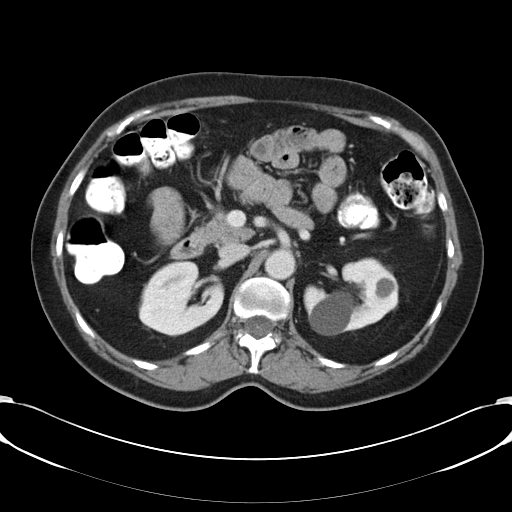
[im 79/119  soft-tissue]
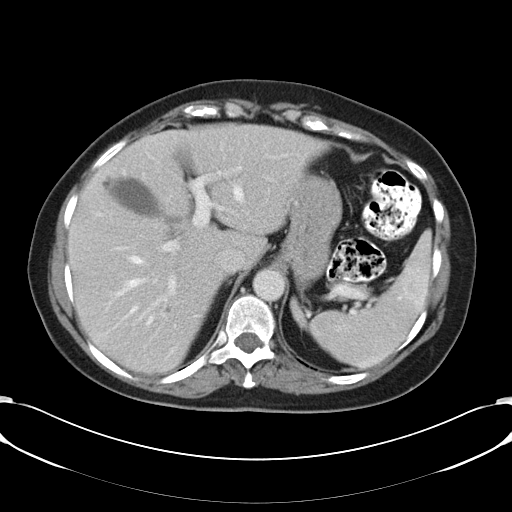
[im 79/119  bone]
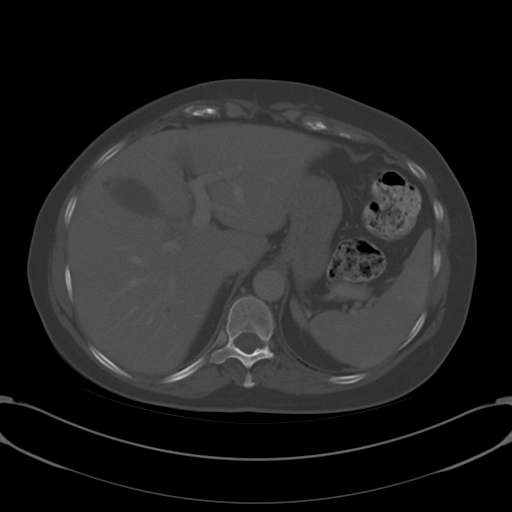
[im 86/119  soft-tissue]
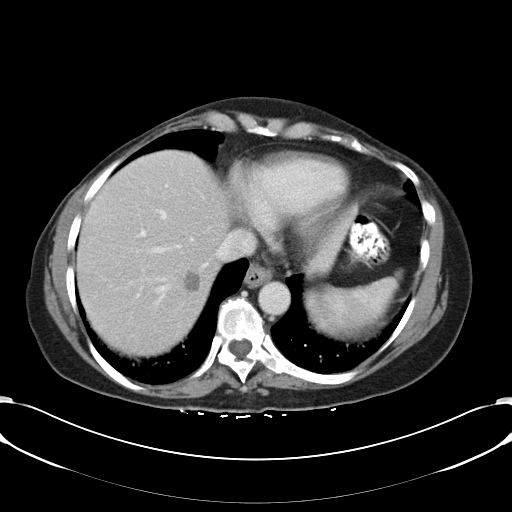
[im 92/119  soft-tissue]
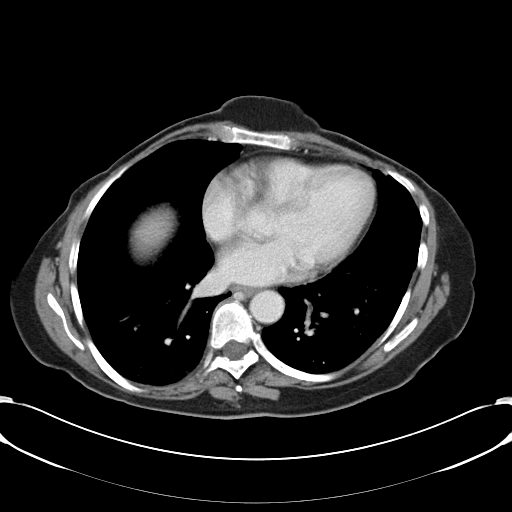
[im 105/119  soft-tissue]
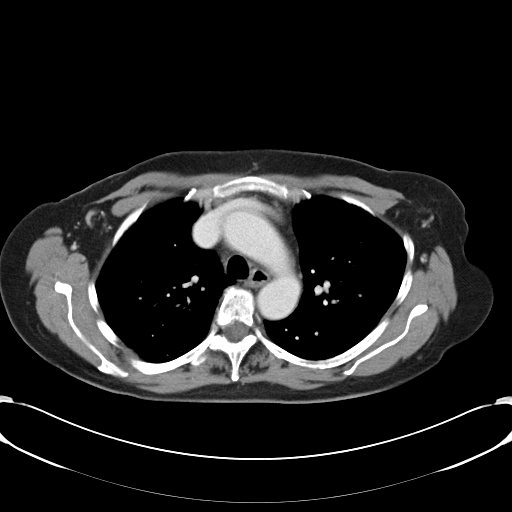
[im 112/119  soft-tissue]
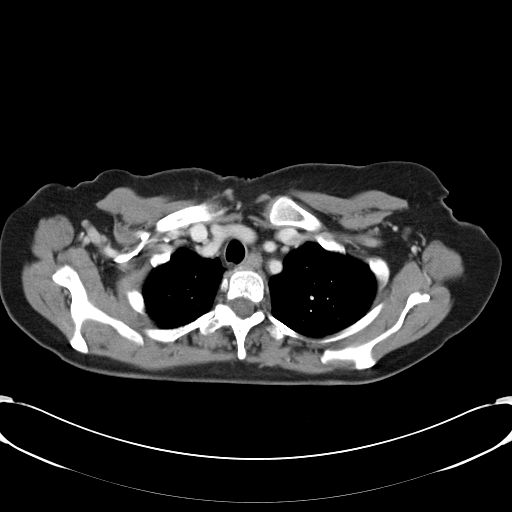

[Series 602: <mpr range> · coronal · 1.20mm/px · 3 of 39 slices shown]
[im 13/39  soft-tissue]
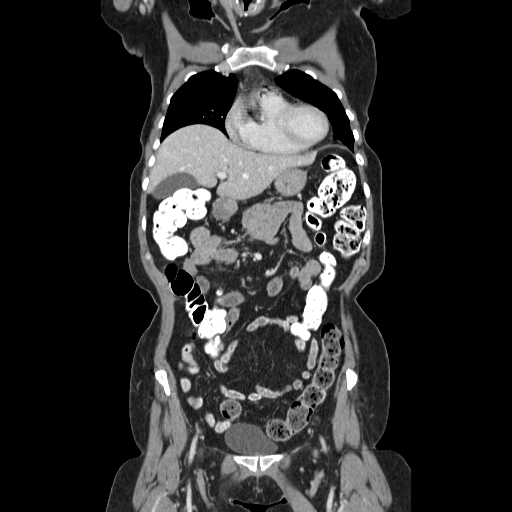
[im 17/39  soft-tissue]
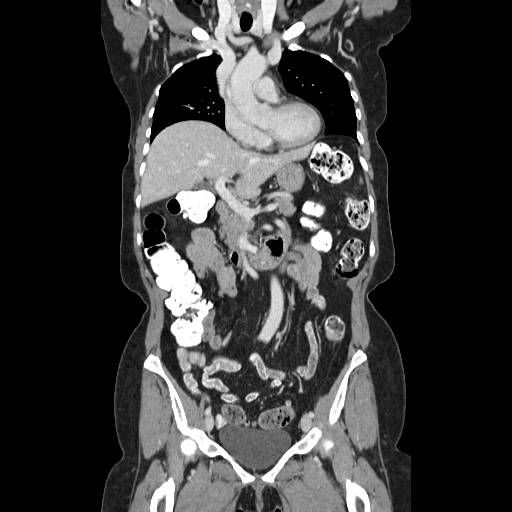
[im 22/39  soft-tissue]
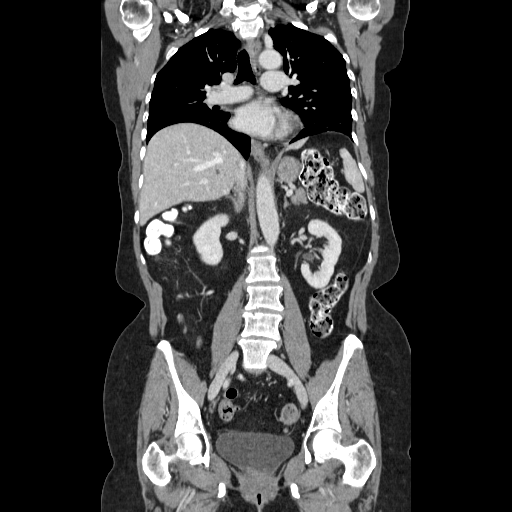

[16 of 46 positions shown; findings below may reference images not displayed]

FINDINGS: Again noted is a low density lesion in the inferior pole of the left thyroid gland.  No pericardial or pleural effusions.  No significant chest adenopathy. Trachea and mainstem bronchi are patent.  Density in the right middle lobe on series 3, image #23.  6 mm noncalcified nodule on the right lower lobe on series 3, image #24 is unchanged.  Small pleural-based punctate densities in the left lower lobe on series 3, image #28 are essentially unchanged.  There is an additional one on image #27 which is also probably stable. Stable punctate density along the left major fissure on image #29. Questionable pleural based density in the right upper lung on image 11, #53. No acute bony abnormalities.
IMPRESSION: 1.  No acute chest findings.
2.  Scattered noncalcified pulmonary nodules measuring less than one cm.  Most of these densities are unchanged.  Others have slightly enlarged but minimally changed since September 21, 04.  Again noted is a heterogeneous area of low density in the left thyroid gland.
ABDOMEN CT WITH CONTRAST:
FINDINGS: Stable low density areas in the liver consistent with cysts. The liver, portal venous system, gallbladder, spleen, adrenals and kidneys are stable. Again noted are multiple low density cysts involving the left kidney. Mild thickening of the gastric antrum is unchanged.  Normal appearance of the small and large bowel without significant adenopathy. Again noted are a few lymph nodes along the left retroperitoneum that are grossly stable. No acute bone abnormalities. Slight enlargement of the largest cyst involving the left kidney but this still remains most compatible with a  cyst.
IMPRESSION: 1.  No acute findings in the abdomen. Redemonstration of renal and hepatic cysts.  Slight enlargement of the largest left renal cyst.
2.  No evidence for metastatic disease.
PELVIS CT WITH CONTRAST:
FINDINGS: Uterus has been removed. Scattered diverticula without acute areas of inflammation.  No free fluid or lymphadenopathy. No acute bony abnormality.  Irregularity along the superior endplate of L-4 is probably a stable finding and could represent a small limbus defect vs old injury.
IMPRESSION: 1.  Negative CT of the pelvis.
2.  Hysterectomy.

## 2006-07-19 ENCOUNTER — Ambulatory Visit: Payer: Self-pay | Admitting: Internal Medicine

## 2006-07-28 ENCOUNTER — Ambulatory Visit: Payer: Self-pay | Admitting: Family Medicine

## 2006-10-08 ENCOUNTER — Ambulatory Visit: Payer: Self-pay | Admitting: Internal Medicine

## 2006-10-08 LAB — CONVERTED CEMR LAB
Alkaline Phosphatase: 44 units/L (ref 39–117)
Eosinophils Absolute: 0.2 10*3/uL (ref 0.0–0.6)
Eosinophils Relative: 3.2 % (ref 0.0–5.0)
HDL: 36.9 mg/dL — ABNORMAL LOW (ref >39.0)
Hemoglobin: 13.4 g/dL (ref 12.0–15.0)
Lymphocytes Relative: 44 % (ref 12.0–46.0)
MCV: 89.2 fL (ref 78.0–100.0)
Monocytes Absolute: 0.7 10*3/uL (ref 0.2–0.7)
Monocytes Relative: 10.8 % (ref 3.0–11.0)
Neutro Abs: 2.6 10*3/uL (ref 1.4–7.7)
Platelets: 350 10*3/uL (ref 150–400)
Total Protein: 5.3 g/dL — ABNORMAL LOW (ref 6.0–8.3)
Triglycerides: 91 mg/dL (ref 0–149)
WBC: 6.4 10*3/uL (ref 4.5–10.5)

## 2006-10-25 ENCOUNTER — Ambulatory Visit: Payer: Self-pay | Admitting: Internal Medicine

## 2006-12-31 DIAGNOSIS — M810 Age-related osteoporosis without current pathological fracture: Secondary | ICD-10-CM | POA: Insufficient documentation

## 2006-12-31 DIAGNOSIS — Z853 Personal history of malignant neoplasm of breast: Secondary | ICD-10-CM | POA: Insufficient documentation

## 2007-02-11 ENCOUNTER — Ambulatory Visit: Payer: Self-pay | Admitting: Internal Medicine

## 2007-02-11 DIAGNOSIS — E785 Hyperlipidemia, unspecified: Secondary | ICD-10-CM | POA: Insufficient documentation

## 2007-02-11 DIAGNOSIS — T887XXA Unspecified adverse effect of drug or medicament, initial encounter: Secondary | ICD-10-CM | POA: Insufficient documentation

## 2007-02-11 DIAGNOSIS — K7689 Other specified diseases of liver: Secondary | ICD-10-CM | POA: Insufficient documentation

## 2007-02-11 DIAGNOSIS — M899 Disorder of bone, unspecified: Secondary | ICD-10-CM | POA: Insufficient documentation

## 2007-02-11 DIAGNOSIS — M949 Disorder of cartilage, unspecified: Secondary | ICD-10-CM

## 2007-02-11 DIAGNOSIS — R918 Other nonspecific abnormal finding of lung field: Secondary | ICD-10-CM | POA: Insufficient documentation

## 2007-02-11 LAB — CONVERTED CEMR LAB
BUN: 10 mg/dL (ref 6–23)
CO2: 28 meq/L (ref 19–32)
Calcium: 9.5 mg/dL (ref 8.4–10.5)
Chloride: 111 meq/L (ref 96–112)
Cholesterol, target level: 200 mg/dL
Creatinine, Ser: 0.7 mg/dL (ref 0.4–1.2)
GFR calc Af Amer: 109 mL/min
GFR calc non Af Amer: 90 mL/min
Glucose, Bld: 92 mg/dL (ref 70–99)
HDL goal, serum: 40 mg/dL
LDL Goal: 130 mg/dL
Potassium: 5.1 meq/L (ref 3.5–5.1)
Sodium: 146 meq/L — ABNORMAL HIGH (ref 135–145)

## 2007-02-14 ENCOUNTER — Ambulatory Visit: Payer: Self-pay | Admitting: Cardiology

## 2007-02-14 ENCOUNTER — Telehealth: Payer: Self-pay | Admitting: Internal Medicine

## 2007-02-14 IMAGING — CT CT CHEST W/ CM
2 of 5 series · 15 of 36 positions shown, 18 images · IV contrast (omnipaque)
Comparison: CT 03/16/06, chest CT 10/10/04 and abdomen/pelvis 10/11/04.

CLINICAL DATA: Lung nodules and renal hepatic cysts.
 ABDOMEN CT WITH CONTRAST ? 02/14/07:
TECHNIQUE: Multidetector CT imaging of the abdomen was performed following the standard protocol during bolus administration of intravenous contrast. 
 Contrast:  125 cc Omnipaque 300 IV and oral contrast.
TECHNIQUE: Multidetector CT imaging of the abdomen was performed following the standard protocol during bolus administration of intravenous contrast.
TECHNIQUE: Multidetector CT imaging of the pelvis was performed following the standard protocol during bolus administration of intravenous contrast.

[Series 2: cap 5.0 b40f st · axial · 0.73mm/px · z∈[-538,-38]mm · 12 of 116 slices shown, 15 images]
[im 8/116  mediastinal]
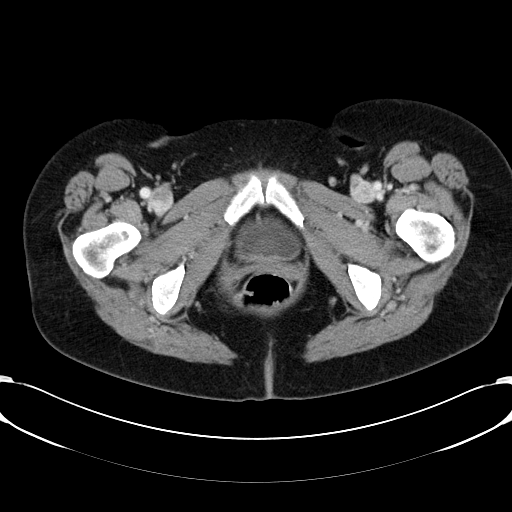
[im 8/116  lung]
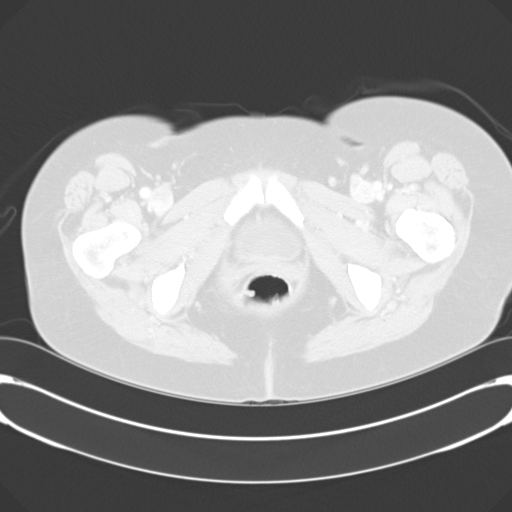
[im 15/116  lung]
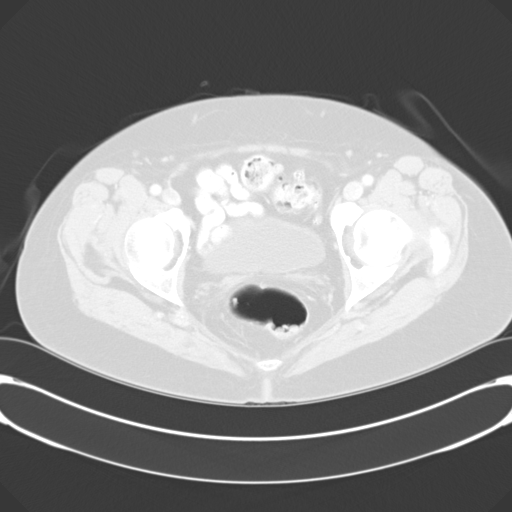
[im 29/116  lung]
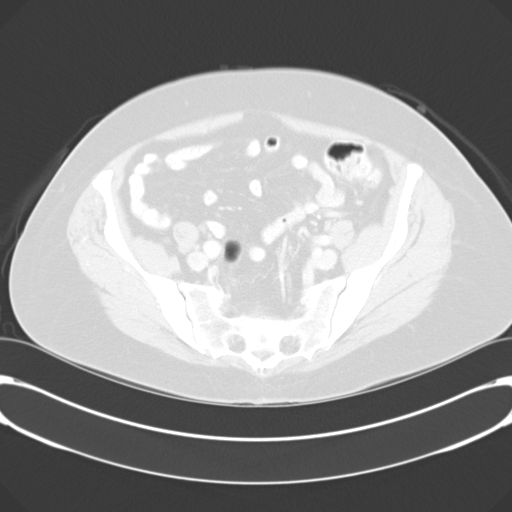
[im 36/116  lung]
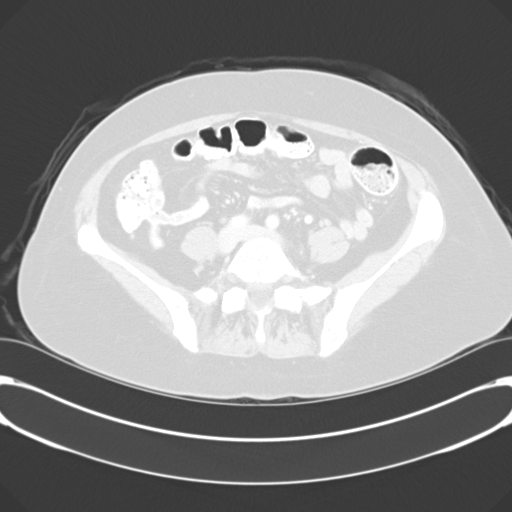
[im 44/116  mediastinal]
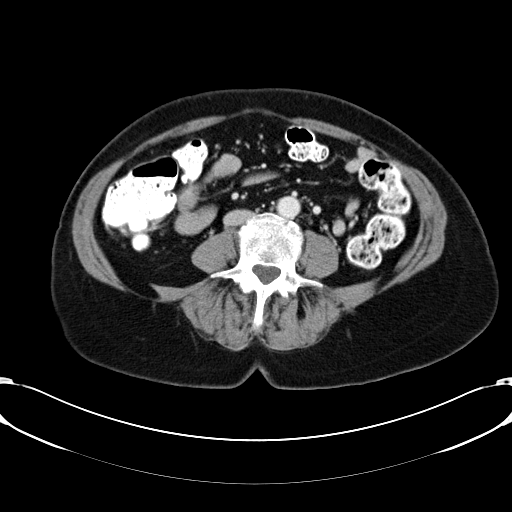
[im 44/116  lung]
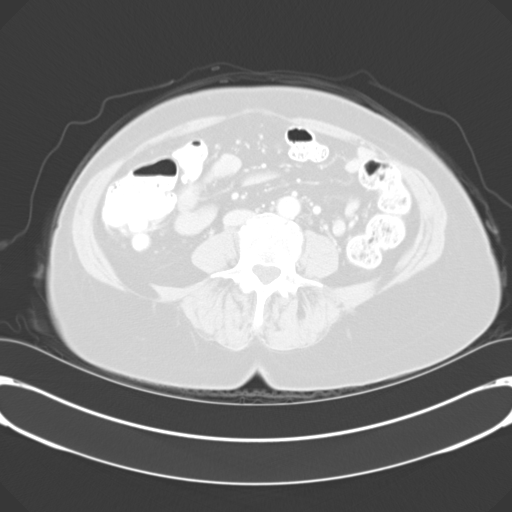
[im 51/116  lung]
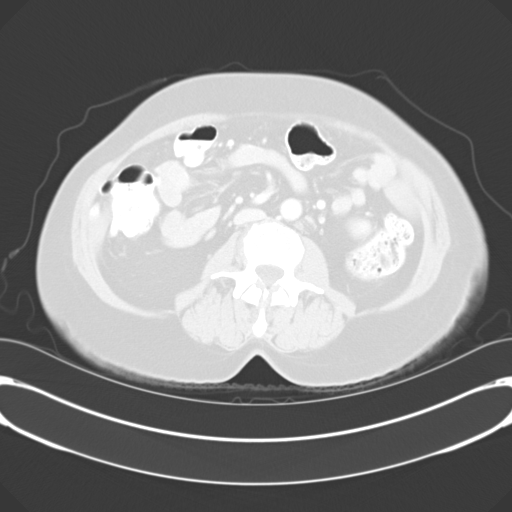
[im 65/116  lung]
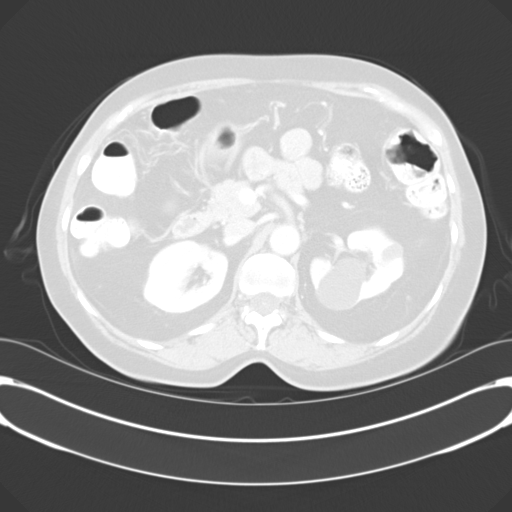
[im 72/116  lung]
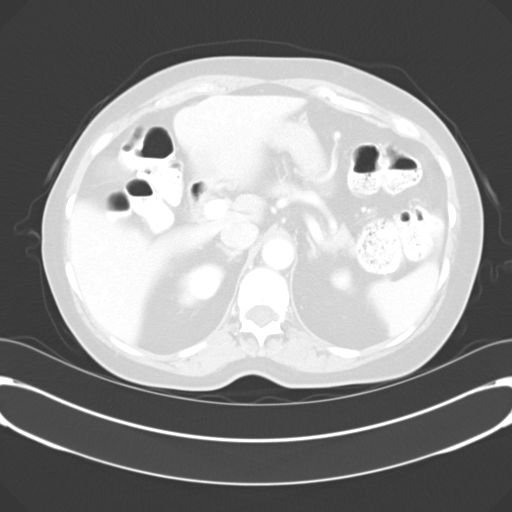
[im 80/116  mediastinal]
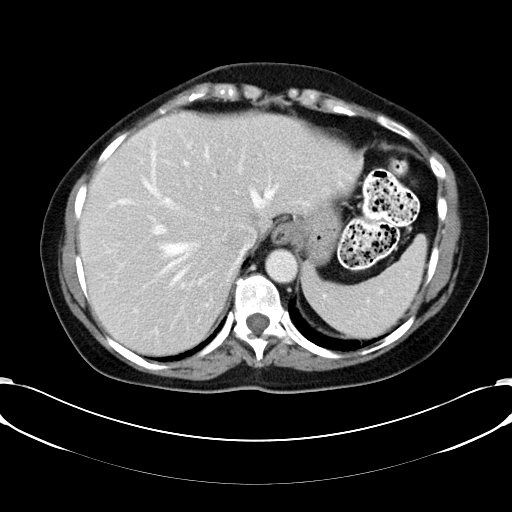
[im 80/116  lung]
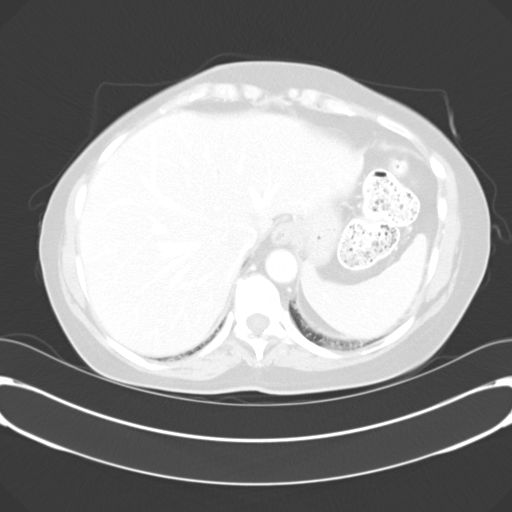
[im 87/116  lung]
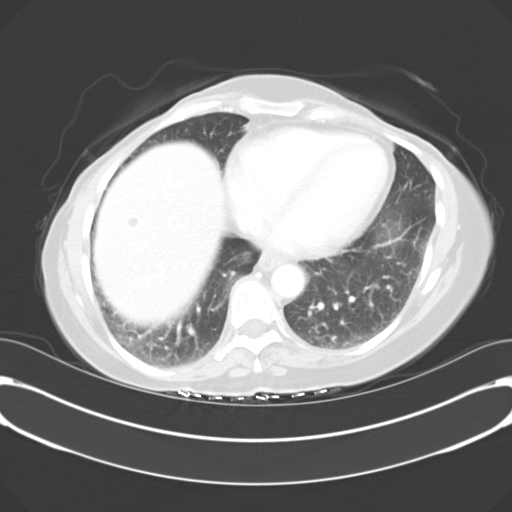
[im 101/116  lung]
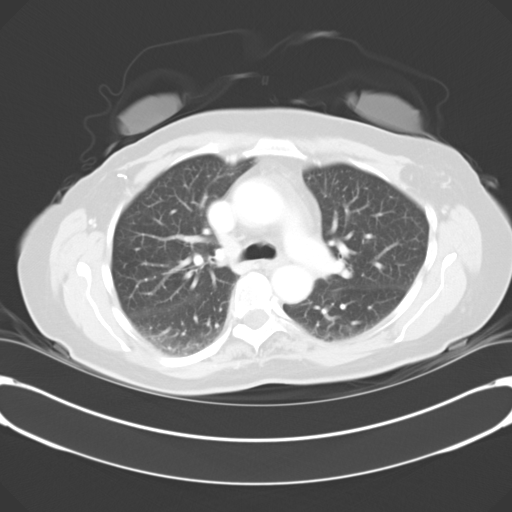
[im 108/116  lung]
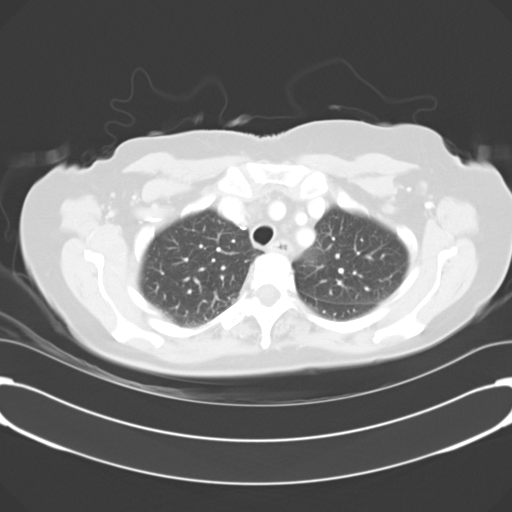

[Series 602: <mpr thick range> · coronal · 1.15mm/px · 3 of 78 slices shown]
[im 16/78  lung]
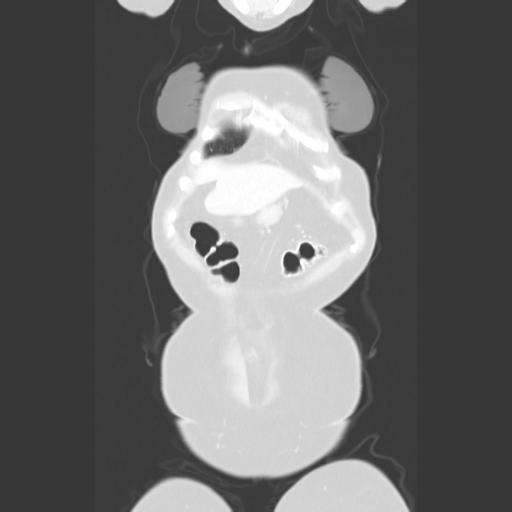
[im 31/78  lung]
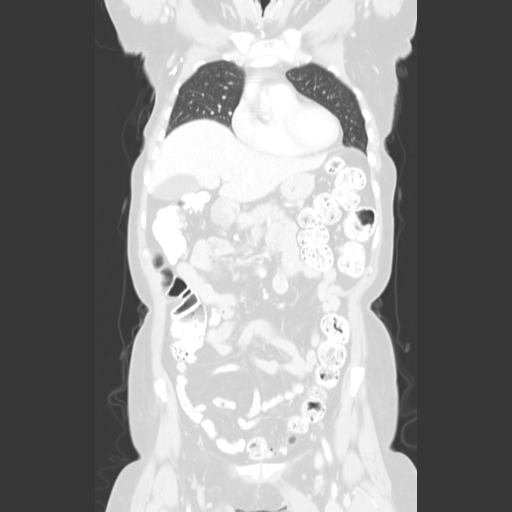
[im 47/78  lung]
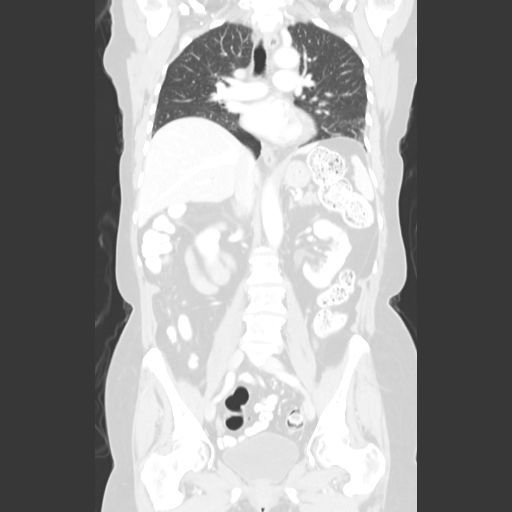

[15 of 36 positions shown; findings below may reference images not displayed]

FINDINGS: A 5 mm left inferior thyroid pole hypodense lesion is unchanged.  The heart size is normal.   No pleural or pericardial effusion.  No mediastinal or hilar lymphadenopathy.  Minimal asymmetric pectus deformity of the anterior chest wall is again noted.  A 4 mm right upper lobe and a 5 mm right lower lobe pulmonary nodules are stable.  Subcentimeter left lower lobe pleural-parenchymal nodules are also unchanged.  Rightward scoliosis of the thoracic spine is noted, centered at C6.
IMPRESSION: No acute finding.    
 Stable pulmonary nodules.
 Dextroscoliosis.
 Stable left inferior thyroid pole lesion.
 Follow-up chest CT with contrast in 1 year is suggested.  If there is no change at that time, no further follow-up would be necessary for these pulmonary nodules.
 ABDOMEN CT WITH CONTRAST ? 02/14/07:
FINDINGS: A 1.2 x 1.7 cm posterior segment right hepatic lobe cyst is noted; this is slightly larger than on the previous study, measuring 1.5 cm maximally.  Scattered hypodensities throughout the liver are unchanged otherwise.  Left mid renal cysts measuring 4.1 x 3.1 cm and 1.5 x 1.1 cm are again noted (previously 3.8 x 2.7 cm and 1.7 x 1.1 cm).  Small retroperitoneal lymph nodes are identified.  The spleen, pancreas, adrenal glands, and gallbladder are normal.
IMPRESSION: Slight increase in size of otherwise benign appearing hepatic and left renal cysts.  This may occur with simple cysts and does not necessarily warrant follow-up.
 PELVIS CT WITH CONTRAST ? 02/14/07:
FINDINGS: The uterus is surgically absent.  Colonic diverticula are noted without diverticulitis.  Levoscoliosis of the lumbar spine is again noted.  Inferior end-plate irregularity at T12 and L4 is again noted.  Posterior osteophyte formation and end-plate irregularity at the inferior end-plate of L4 is also noted. These findings are likely degenerative in nature.
IMPRESSION: No acute intrapelvic finding or interval change.
 Overall Recommendation:  Follow-up chest CT with IV contrast in 1 year.   No further follow-up of abdomen or pelvis is specifically required for the above listed findings.

## 2007-02-21 ENCOUNTER — Ambulatory Visit: Payer: Self-pay | Admitting: Internal Medicine

## 2007-02-21 ENCOUNTER — Encounter: Payer: Self-pay | Admitting: Internal Medicine

## 2007-04-22 ENCOUNTER — Telehealth: Payer: Self-pay | Admitting: Internal Medicine

## 2007-06-20 ENCOUNTER — Ambulatory Visit: Payer: Self-pay | Admitting: Internal Medicine

## 2007-06-20 LAB — CONVERTED CEMR LAB
ALT: 9 units/L (ref 0–35)
Basophils Relative: 0 % (ref 0.0–1.0)
Bilirubin, Direct: 0.2 mg/dL (ref 0.0–0.3)
Blood in Urine, dipstick: NEGATIVE
CO2: 29 meq/L (ref 19–32)
Eosinophils Absolute: 0.2 10*3/uL (ref 0.0–0.6)
Eosinophils Relative: 3.5 % (ref 0.0–5.0)
GFR calc Af Amer: 109 mL/min
GFR calc non Af Amer: 90 mL/min
Glucose, Bld: 92 mg/dL (ref 70–99)
Glucose, Urine, Semiquant: NEGATIVE
HCT: 41.5 % (ref 36.0–46.0)
Hemoglobin: 13.6 g/dL (ref 12.0–15.0)
Lymphocytes Relative: 39 % (ref 12.0–46.0)
MCV: 90.3 fL (ref 78.0–100.0)
Monocytes Absolute: 0.6 10*3/uL (ref 0.2–0.7)
Neutro Abs: 2.7 10*3/uL (ref 1.4–7.7)
Neutrophils Relative %: 46.3 % (ref 43.0–77.0)
Platelets: 277 10*3/uL (ref 150–400)
Potassium: 4.1 meq/L (ref 3.5–5.1)
Sodium: 143 meq/L (ref 135–145)
TSH: 1.33 microintl units/mL (ref 0.35–5.50)
Total Protein: 5.8 g/dL — ABNORMAL LOW (ref 6.0–8.3)
Triglycerides: 83 mg/dL (ref 0–149)
VLDL: 17 mg/dL (ref 0–40)
WBC Urine, dipstick: NEGATIVE
WBC: 5.7 10*3/uL (ref 4.5–10.5)
pH: 6.5

## 2007-06-27 ENCOUNTER — Ambulatory Visit: Payer: Self-pay | Admitting: Internal Medicine

## 2007-06-27 DIAGNOSIS — K5730 Diverticulosis of large intestine without perforation or abscess without bleeding: Secondary | ICD-10-CM | POA: Insufficient documentation

## 2007-07-15 ENCOUNTER — Encounter: Payer: Self-pay | Admitting: Internal Medicine

## 2008-02-03 ENCOUNTER — Encounter: Payer: Self-pay | Admitting: *Deleted

## 2008-02-03 ENCOUNTER — Telehealth: Payer: Self-pay | Admitting: Internal Medicine

## 2008-02-05 ENCOUNTER — Ambulatory Visit: Payer: Self-pay | Admitting: Internal Medicine

## 2008-02-05 LAB — CONVERTED CEMR LAB
ALT: 9 units/L (ref 0–35)
AST: 8 units/L (ref 0–37)
BUN: 13 mg/dL (ref 6–23)
Bilirubin, Direct: 0.1 mg/dL (ref 0.0–0.3)
Creatinine, Ser: 0.7 mg/dL (ref 0.4–1.2)
HDL: 61.9 mg/dL (ref >39.0)
Total Bilirubin: 0.8 mg/dL (ref 0.3–1.2)
Total Protein: 6.3 g/dL (ref 6.0–8.3)
Triglycerides: 93 mg/dL (ref 0–149)

## 2008-02-06 ENCOUNTER — Ambulatory Visit: Payer: Self-pay | Admitting: Cardiology

## 2008-02-06 IMAGING — CT CT PELVIS W/ CM
1 of 3 series · 14 of 32 positions shown, 19 images · IV contrast (agent unspecified)
Comparison: CT chest of 02/14/2007

CT CHEST

CLINICAL DATA: History of breast carcinoma, follow-up of lung
nodules

CT CHEST, ABDOMEN AND PELVIS WITH CONTRAST
TECHNIQUE: Multidetector CT imaging of the chest, abdomen and
pelvis was performed following the standard protocol during bolus
administration of intravenous contrast.
Contrast: 100 ml 3mnipaque-5YY

[Series 2: cap 5.0 b40f st · axial · 0.76mm/px · z∈[-606,-46]mm · 14 of 126 slices shown, 19 images]
[im 7/126  soft-tissue]
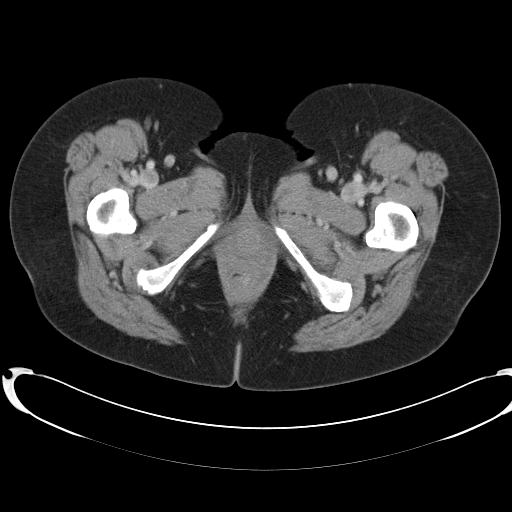
[im 7/126  bone]
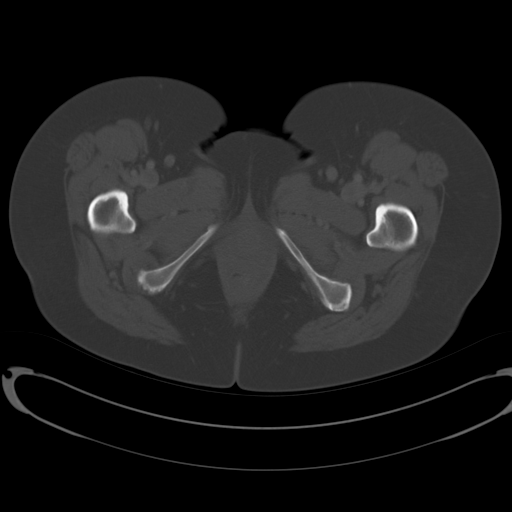
[im 20/126  soft-tissue]
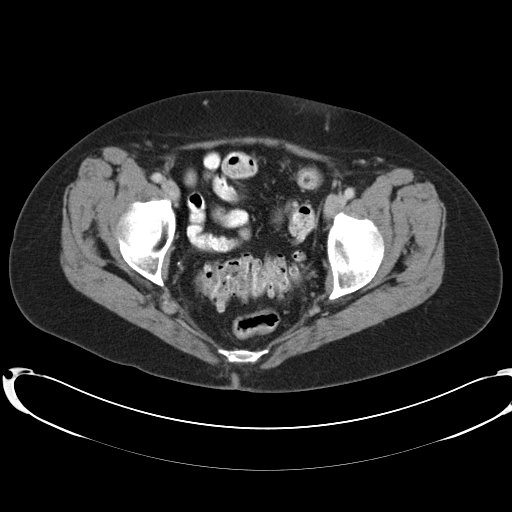
[im 27/126  soft-tissue]
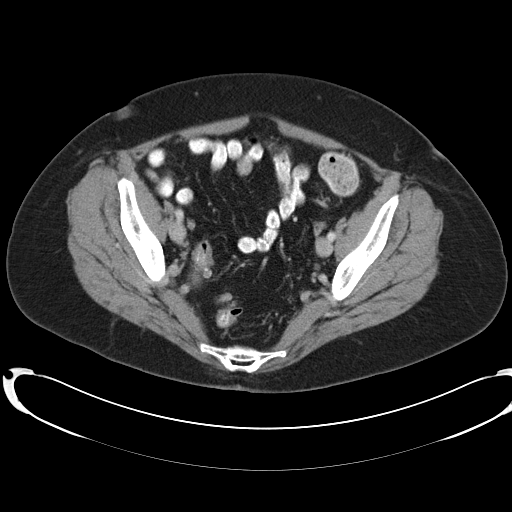
[im 33/126  soft-tissue]
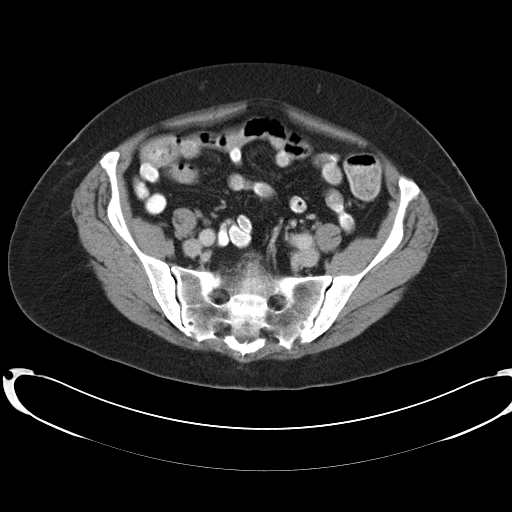
[im 47/126  soft-tissue]
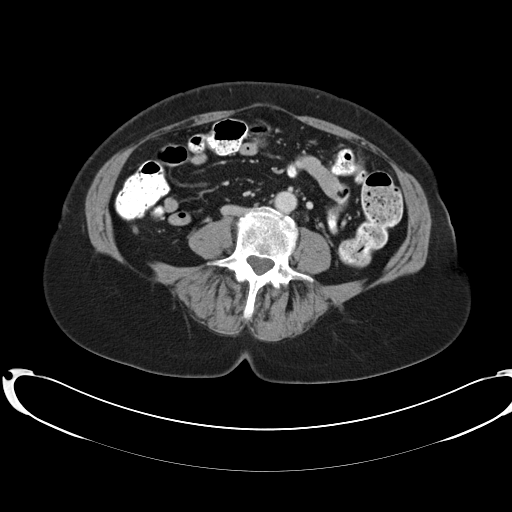
[im 53/126  soft-tissue]
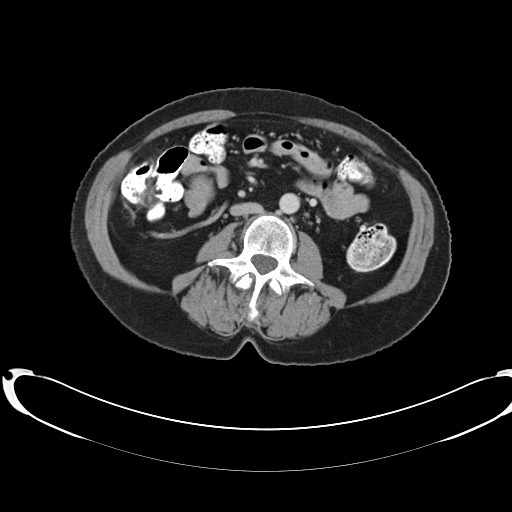
[im 66/126  soft-tissue]
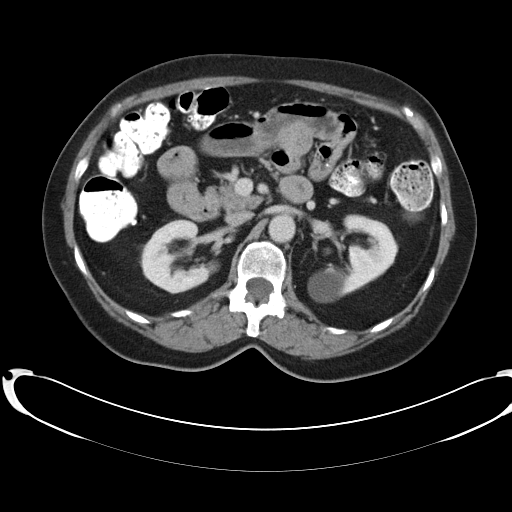
[im 73/126  soft-tissue]
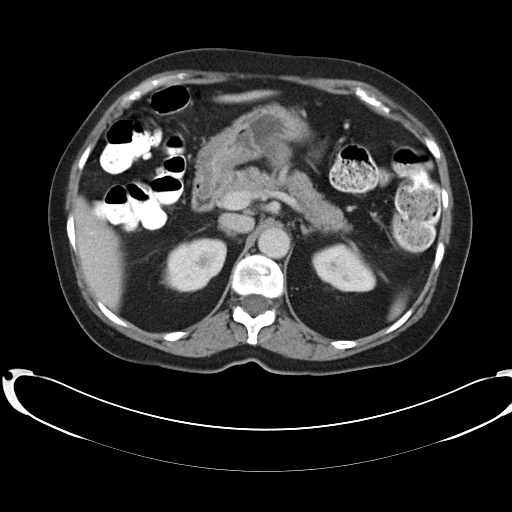
[im 79/126  soft-tissue]
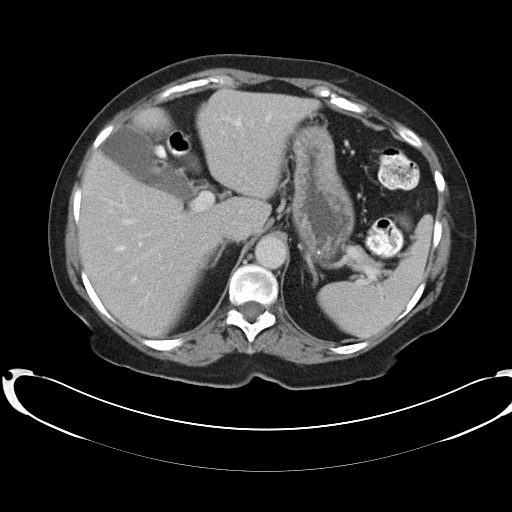
[im 79/126  bone]
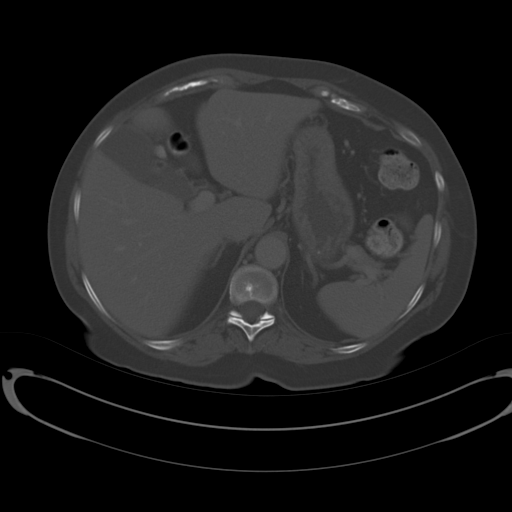
[im 93/126  soft-tissue]
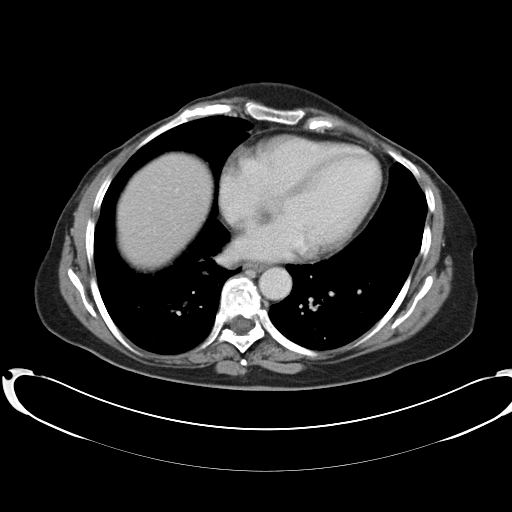
[im 99/126  soft-tissue]
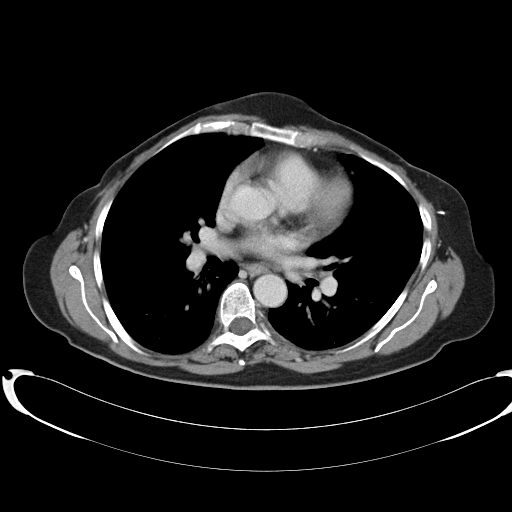
[im 99/126  lung]
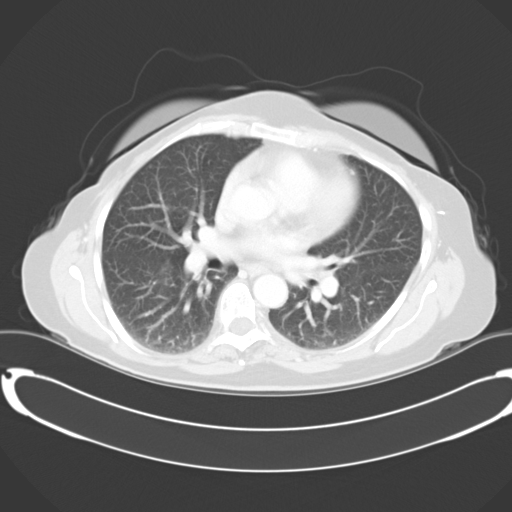
[im 106/126  soft-tissue]
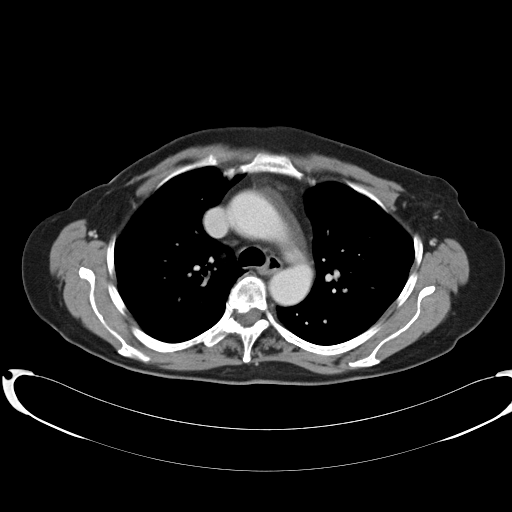
[im 106/126  lung]
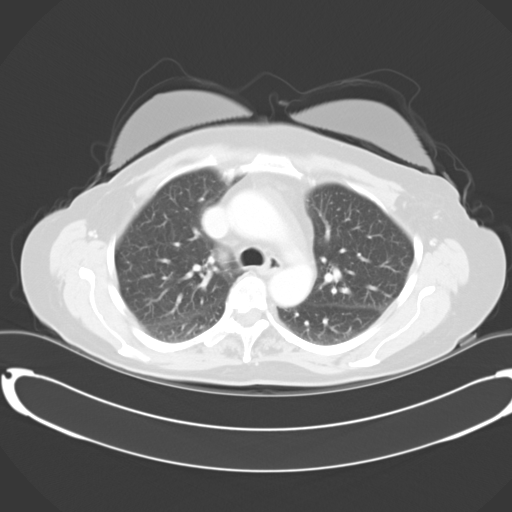
[im 112/126  lung]
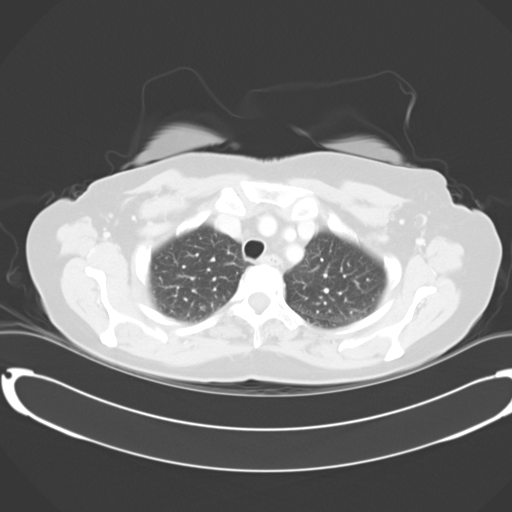
[im 119/126  soft-tissue]
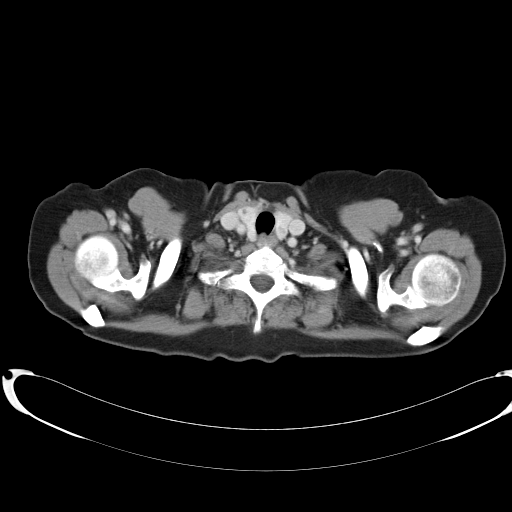
[im 119/126  lung]
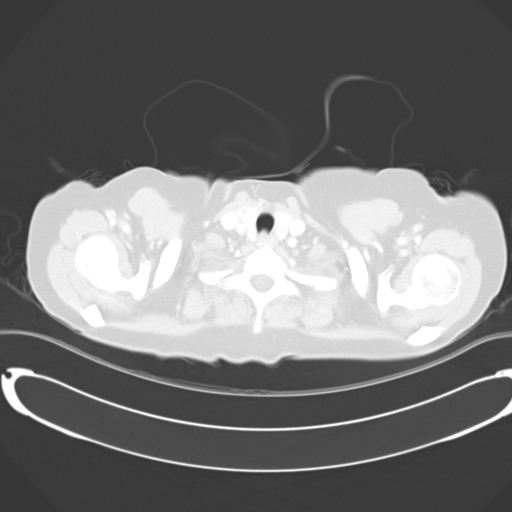

[14 of 32 positions shown; findings below may reference images not displayed]

FINDINGS: The majority of nodules have either resolved or are
completely stable in size.  No new lung nodule is seen and no
enlarging nodule is noted.  No infiltrate or effusion is seen.
There is cardiomegaly present.  The pulmonary arteries and thoracic
aorta opacify with no significant abnormality.  No mediastinal or
hilar adenopathy is seen.
IMPRESSION: All of the lung nodules noted previously have either resolved or
are stable.  No new abnormality is seen.

CT ABDOMEN
FINDINGS: There are multiple well defined low attenuation
structures throughout the liver primarily in the right lobe most
consistent with benign process, such as cysts.  No new hepatic
abnormality is seen.  No ductal dilatation is noted.  No calcified
gallstones are seen.  The pancreas is stable in size and the
pancreatic duct is not dilated.  The adrenal glands and spleen
appear normal.  The kidneys enhance and left renal cysts are
stable.  On delayed images the pelvocaliceal systems appear normal.
The abdominal aorta is normal in caliber.  The appendix is well
seen within the right lower quadrant and appears normal.  The
terminal ileum also is normal.
IMPRESSION: Stable hepatic and renal cysts.  No new abnormality.  No
adenopathy.

CT PELVIS
FINDINGS: There are multiple rectosigmoid colonic diverticula
present.  No colonic abnormality is seen.  The urinary bladder is
not well distended but is unremarkable.  The patient has previously
undergone hysterectomy.  No pelvic mass, fluid, or adenopathy is
seen.  Thoracolumbar scoliosis is noted.
IMPRESSION: Rectosigmoid colonic diverticula.  No acute abnormality.

## 2008-02-07 ENCOUNTER — Telehealth: Payer: Self-pay | Admitting: Gastroenterology

## 2008-02-07 ENCOUNTER — Ambulatory Visit: Payer: Self-pay | Admitting: Internal Medicine

## 2008-02-13 ENCOUNTER — Telehealth: Payer: Self-pay | Admitting: Internal Medicine

## 2008-02-27 ENCOUNTER — Encounter: Payer: Self-pay | Admitting: *Deleted

## 2008-03-02 ENCOUNTER — Encounter: Payer: Self-pay | Admitting: Gastroenterology

## 2008-03-04 ENCOUNTER — Telehealth: Payer: Self-pay | Admitting: Gastroenterology

## 2008-03-05 ENCOUNTER — Telehealth: Payer: Self-pay | Admitting: Gastroenterology

## 2008-03-11 ENCOUNTER — Encounter: Admission: RE | Admit: 2008-03-11 | Discharge: 2008-03-11 | Payer: Self-pay | Admitting: Gastroenterology

## 2008-03-11 IMAGING — RF DG BE W/ AIR HIGH DENSITY
10 of 18 series · 16 of 24 positions shown · IV contrast (agent unspecified)
Comparison: CT abdomen 02/06/2008

CLINICAL DATA: Screening for malignancy of the colon.  Prior
incomplete colonoscopy.

AIR-CONTRAST BARIUM ENEMA
TECHNIQUE: After obtaining a scout radiograph air-contrast barium
enema was performed under fluoroscopy using high-density barium.
Fluoroscopy Time: 3.8 minutes
Contrast: High density barium

[Series 1: run · 2 of 4 slices shown (1 of 8)]
[im 1/4]
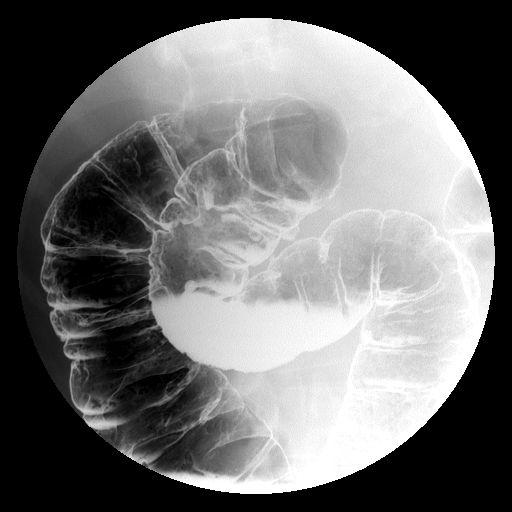
[im 4/4]
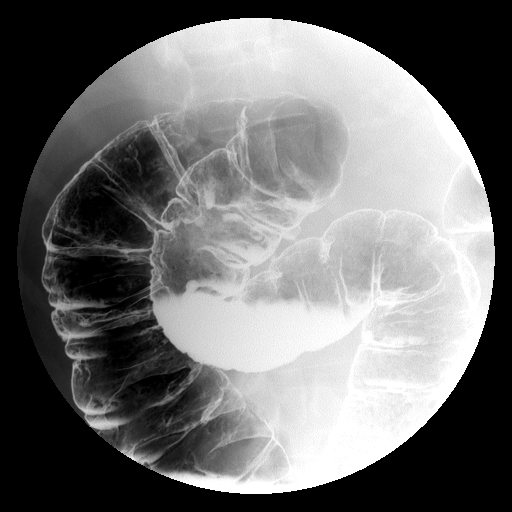

[Series 2: run · 1 of 2 slices shown (2 of 8)]
[im 1/2]
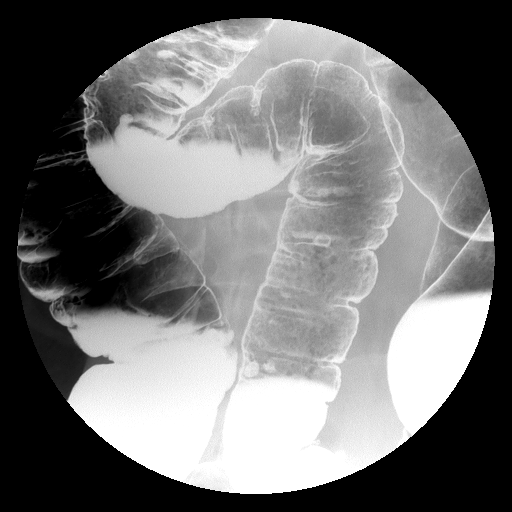

[Series 3: run · 1 of 2 slices shown (3 of 8)]
[im 1/2]
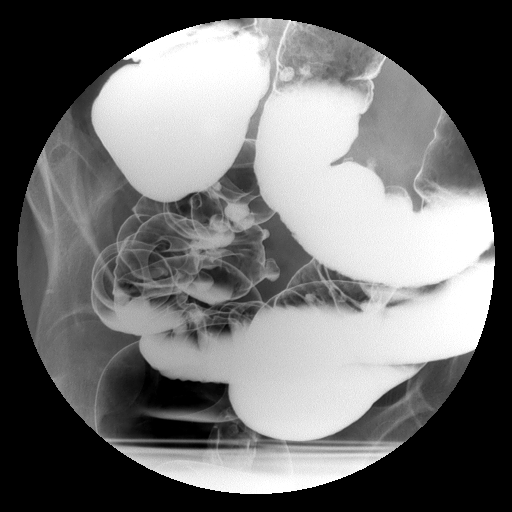

[Series 4: run · 2 of 2 slices shown (4 of 8)]
[im 1/2]
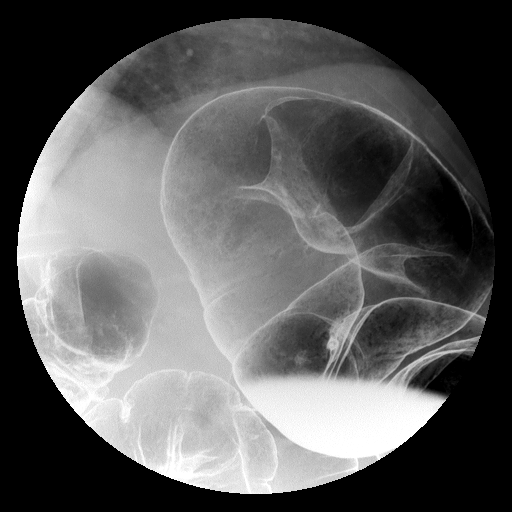
[im 2/2]
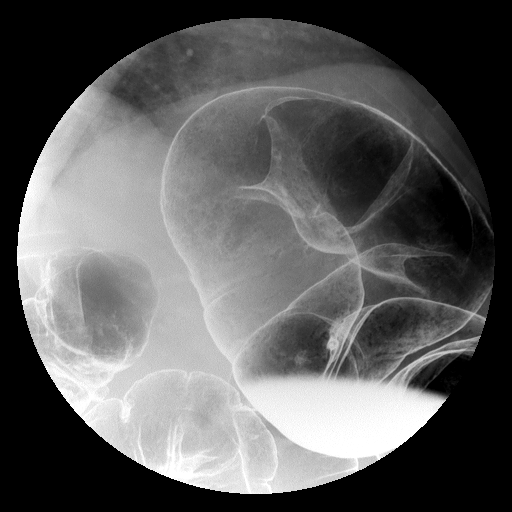

[Series 5: run · 2 of 2 slices shown (5 of 8)]
[im 1/2]
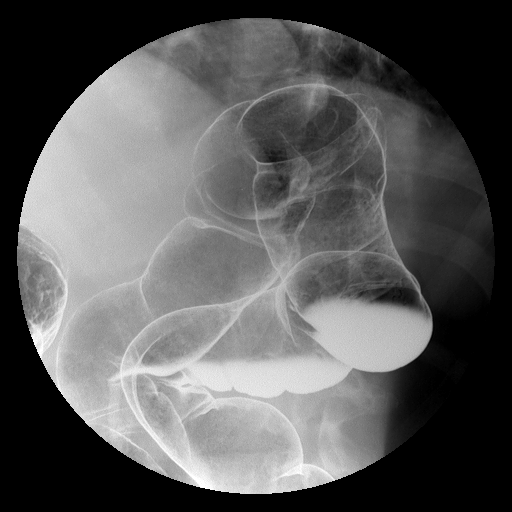
[im 2/2]
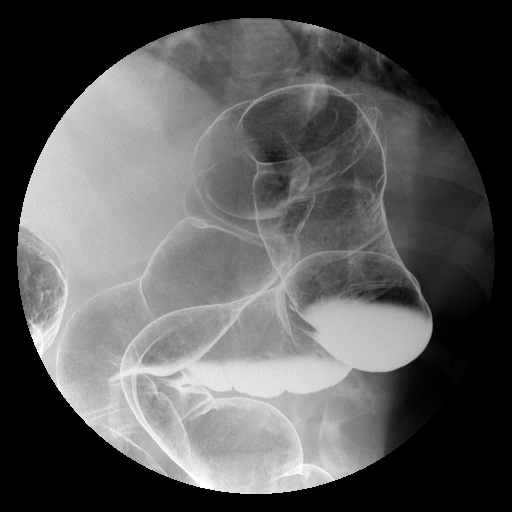

[Series 6: run · 2 of 2 slices shown (6 of 8)]
[im 1/2]
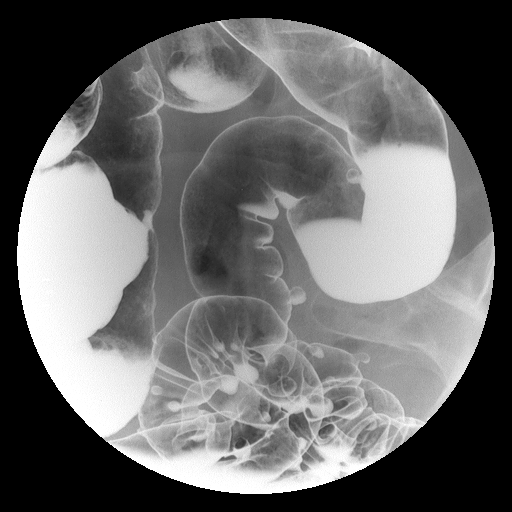
[im 2/2]
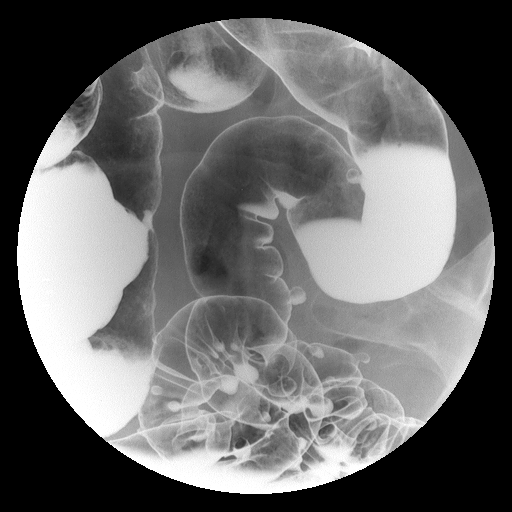

[Series 7: run · 2 of 2 slices shown (7 of 8)]
[im 1/2]
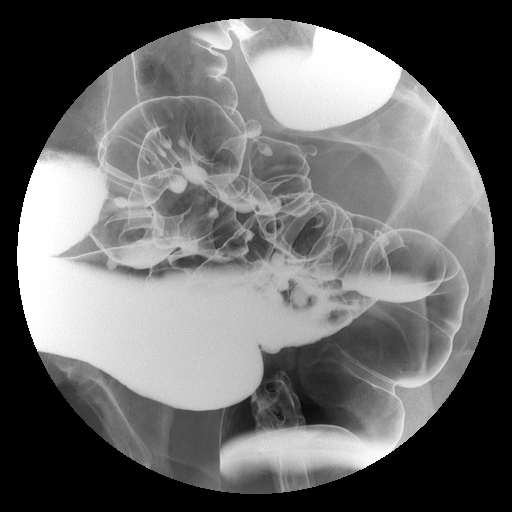
[im 2/2]
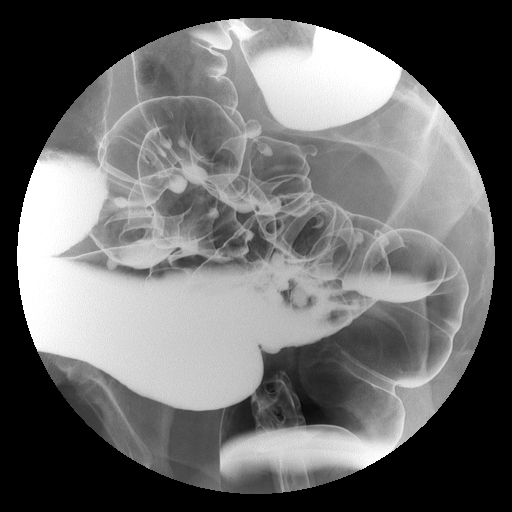

[Series 8: run · 2 of 2 slices shown (8 of 8)]
[im 1/2]
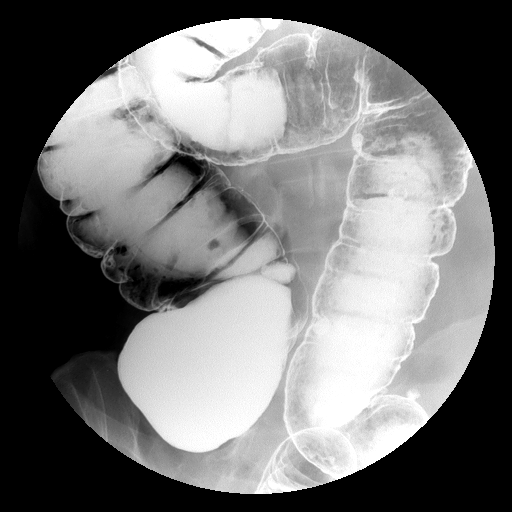
[im 2/2]
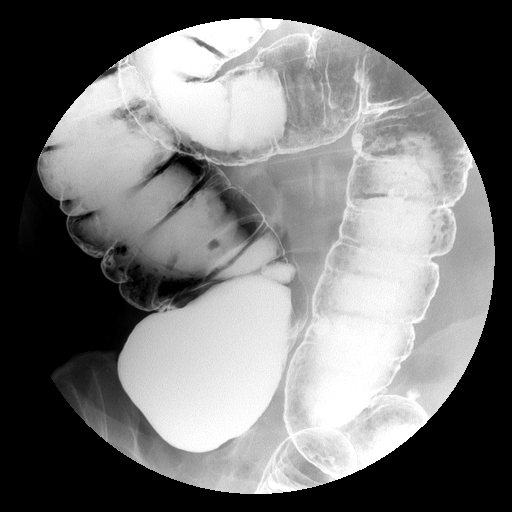

[Series 1001: view not recorded · 0.20mm/px · 1 of 1 slices shown (1 of 2)]
[im 1/1]
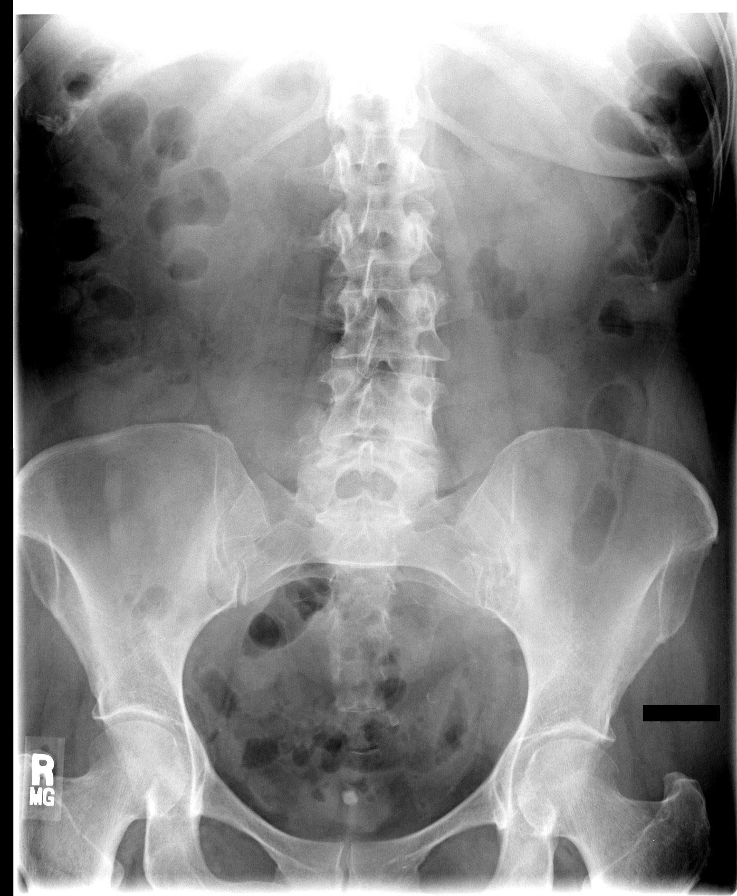

[Series 1002: view not recorded · 0.20mm/px · 1 of 1 slices shown (2 of 2)]
[im 1/1]
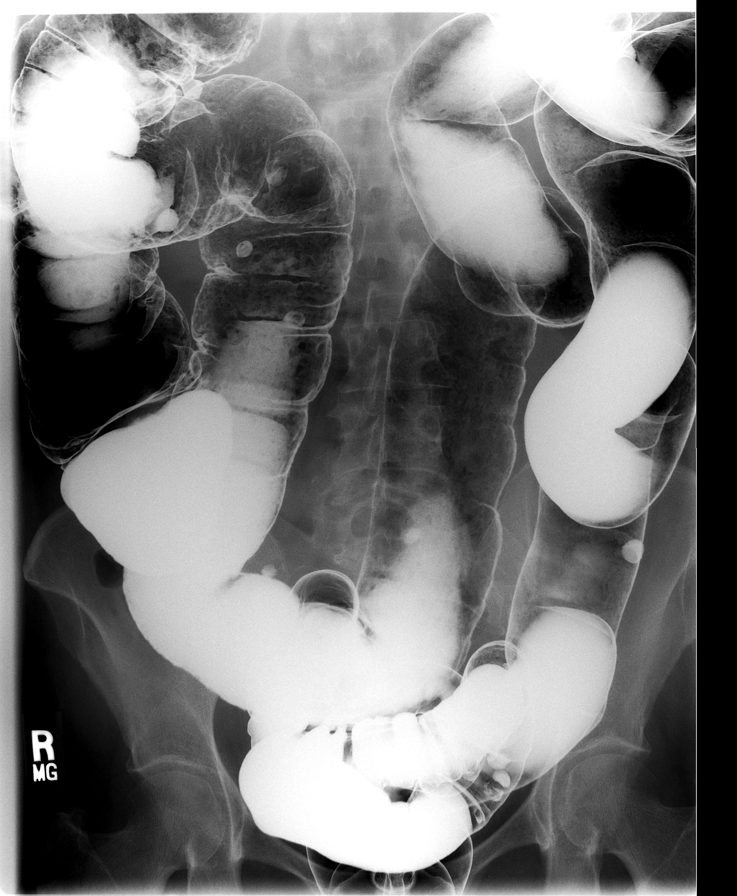

[16 of 24 positions shown; findings below may reference images not displayed]

FINDINGS: High density barium was allowed to flow around to the
cecum.  There is no reflux into the terminal ileum.  The colon was
filled with air.  Preliminary film of the abdomen reveals a
nonobstructive bowel gas pattern.

There is extensive diverticulosis of the sigmoid colon.  There also
are scattered diverticula throughout the right and left colon.
There is retained stool in the right colon and I cannot exclude a
polyp in the right colon.  There is no mass lesion or obstruction.
No polyp is seen in the sigmoid or left colon.
IMPRESSION: There is pandiverticulosis, most prominent in the sigmoid colon.

Negative for mass lesion.

Retained stool in the right colon precludes detailed evaluation for
polyp.

## 2008-03-16 ENCOUNTER — Encounter: Payer: Self-pay | Admitting: Internal Medicine

## 2008-07-23 ENCOUNTER — Ambulatory Visit: Payer: Self-pay | Admitting: Internal Medicine

## 2008-07-23 LAB — CONVERTED CEMR LAB
ALT: 9 units/L (ref 0–35)
AST: 10 units/L (ref 0–37)
Alkaline Phosphatase: 59 units/L (ref 39–117)
Basophils Absolute: 0 10*3/uL (ref 0.0–0.1)
Bilirubin Urine: NEGATIVE
Bilirubin, Direct: 0.1 mg/dL (ref 0.0–0.3)
CO2: 30 meq/L (ref 19–32)
Calcium: 9 mg/dL (ref 8.4–10.5)
Chloride: 110 meq/L (ref 96–112)
Cholesterol: 155 mg/dL (ref 0–200)
Ketones, urine, test strip: NEGATIVE
LDL Cholesterol: 89 mg/dL (ref 0–99)
Lymphocytes Relative: 37.3 % (ref 12.0–46.0)
MCHC: 33.6 g/dL (ref 30.0–36.0)
Monocytes Relative: 10.7 % (ref 3.0–12.0)
Neutrophils Relative %: 48.6 % (ref 43.0–77.0)
Nitrite: NEGATIVE
Platelets: 279 10*3/uL (ref 150–400)
Potassium: 4.5 meq/L (ref 3.5–5.1)
Protein, U semiquant: NEGATIVE
RDW: 13.2 % (ref 11.5–14.6)
Sodium: 144 meq/L (ref 135–145)
Total Bilirubin: 0.8 mg/dL (ref 0.3–1.2)
Total CHOL/HDL Ratio: 3.1
Triglycerides: 77 mg/dL (ref 0–149)
Urobilinogen, UA: 0.2
VLDL: 15 mg/dL (ref 0–40)

## 2008-07-30 ENCOUNTER — Ambulatory Visit: Payer: Self-pay | Admitting: Internal Medicine

## 2008-07-30 LAB — CONVERTED CEMR LAB: LDL Goal: 160 mg/dL

## 2009-03-09 ENCOUNTER — Telehealth: Payer: Self-pay | Admitting: Internal Medicine

## 2009-04-21 ENCOUNTER — Telehealth: Payer: Self-pay | Admitting: *Deleted

## 2009-04-21 ENCOUNTER — Encounter: Payer: Self-pay | Admitting: Internal Medicine

## 2009-04-22 ENCOUNTER — Ambulatory Visit: Payer: Self-pay | Admitting: Internal Medicine

## 2009-04-22 ENCOUNTER — Encounter (INDEPENDENT_AMBULATORY_CARE_PROVIDER_SITE_OTHER): Payer: Self-pay | Admitting: *Deleted

## 2009-04-27 ENCOUNTER — Ambulatory Visit: Payer: Self-pay | Admitting: Cardiovascular Disease

## 2009-04-27 IMAGING — CT CT ABDOMEN W/ CM
2 of 5 series · 16 of 46 positions shown, 18 images · IV contrast (agent unspecified)
Comparison: CT 02/06/2008

CT CHEST

CLINICAL DATA: History of breast cancer diagnosed in 1995.  Lung
nodule.  Liver cyst.

CT CHEST, ABDOMEN AND PELVIS WITH CONTRAST
TECHNIQUE: Multidetector CT imaging of the chest, abdomen and
pelvis was performed following the standard protocol during bolus
administration of intravenous contrast.
Contrast: 100 mlOmnipaque 300

[Series 2: cap with · axial · 0.70mm/px · z∈[-613,-103]mm · 13 of 116 slices shown, 15 images]
[im 7/116  soft-tissue]
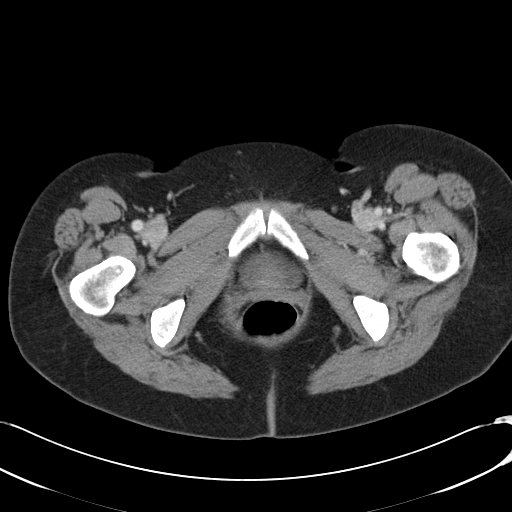
[im 7/116  bone]
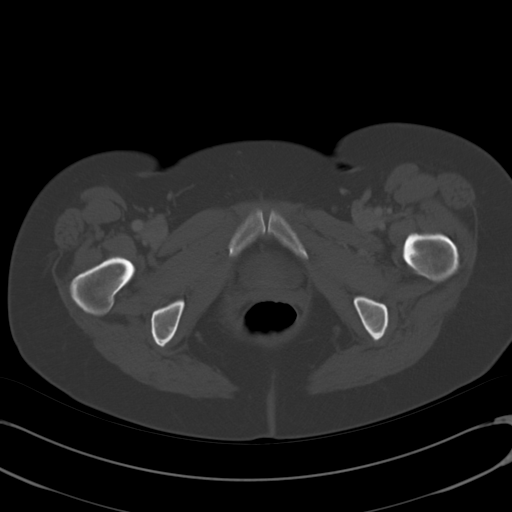
[im 13/116  soft-tissue]
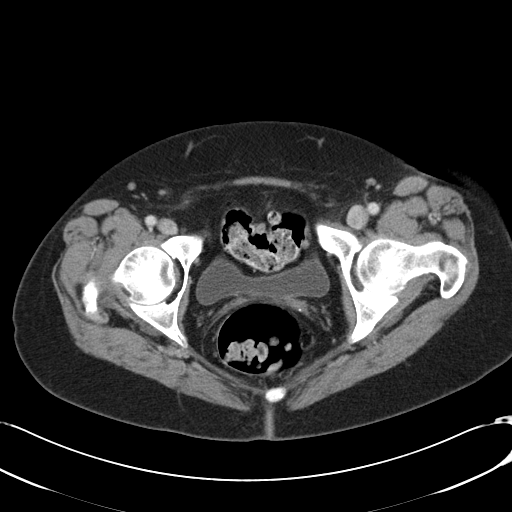
[im 26/116  soft-tissue]
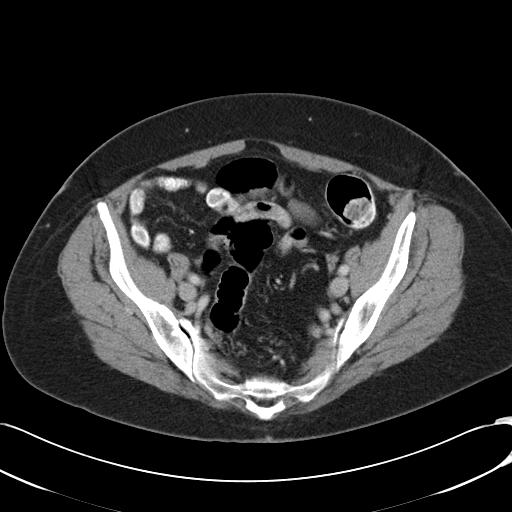
[im 32/116  soft-tissue]
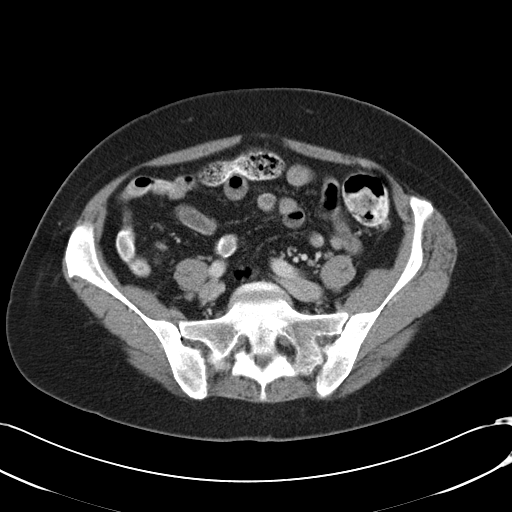
[im 39/116  soft-tissue]
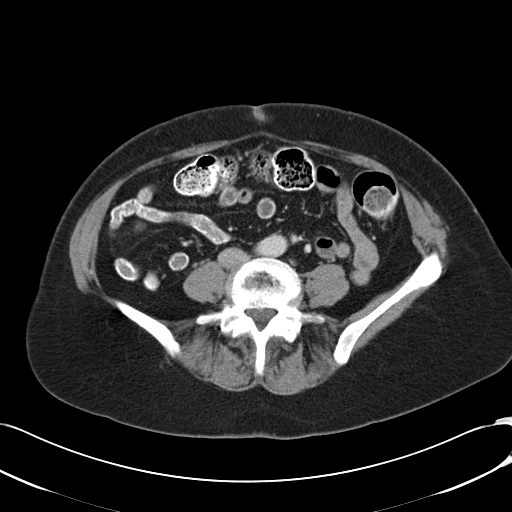
[im 52/116  soft-tissue]
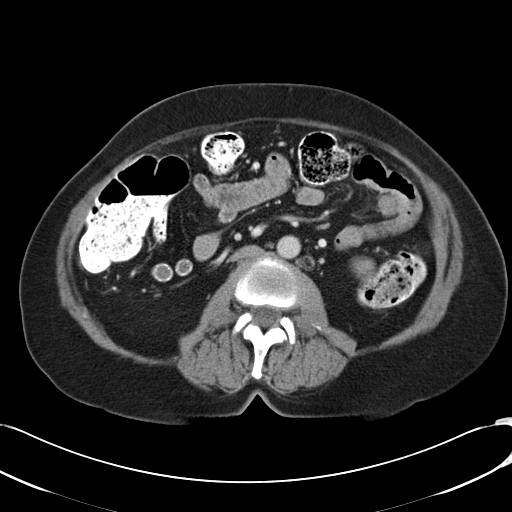
[im 58/116  soft-tissue]
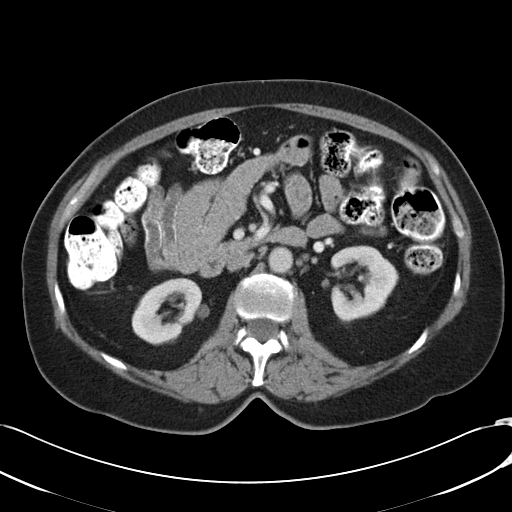
[im 64/116  soft-tissue]
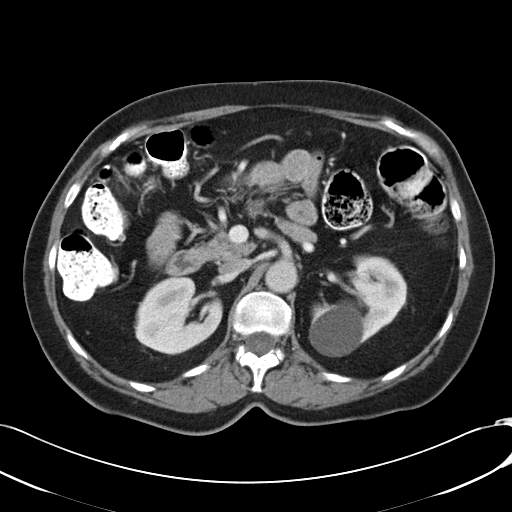
[im 77/116  soft-tissue]
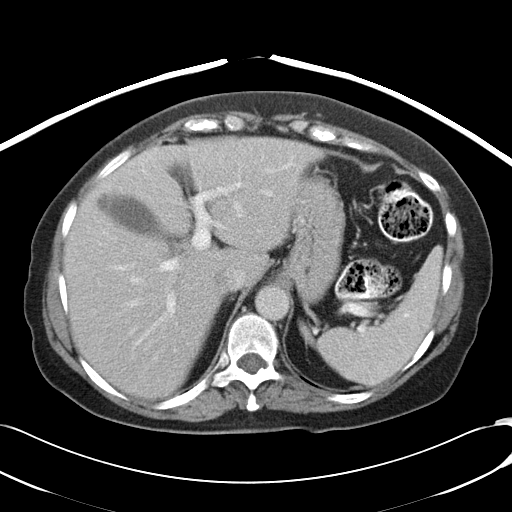
[im 77/116  bone]
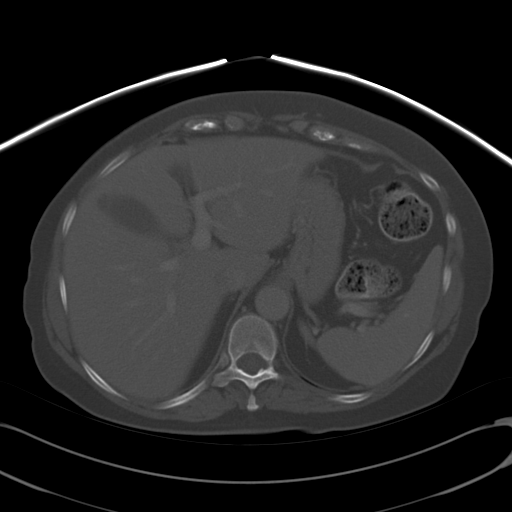
[im 84/116  soft-tissue]
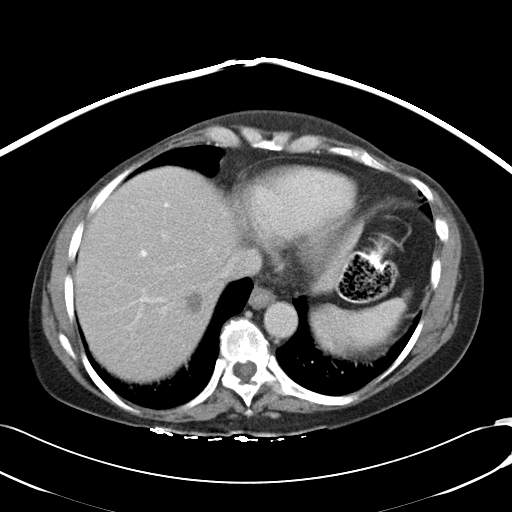
[im 90/116  soft-tissue]
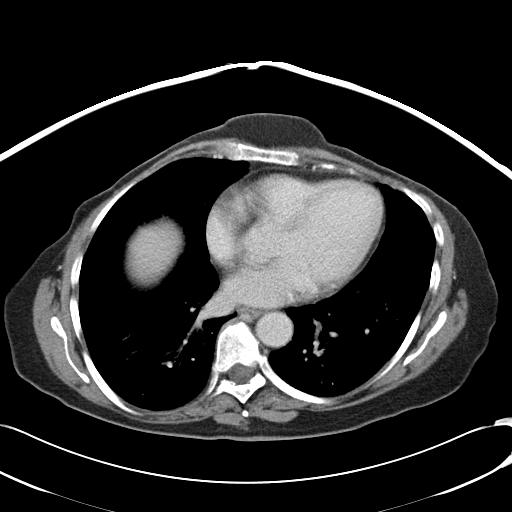
[im 103/116  soft-tissue]
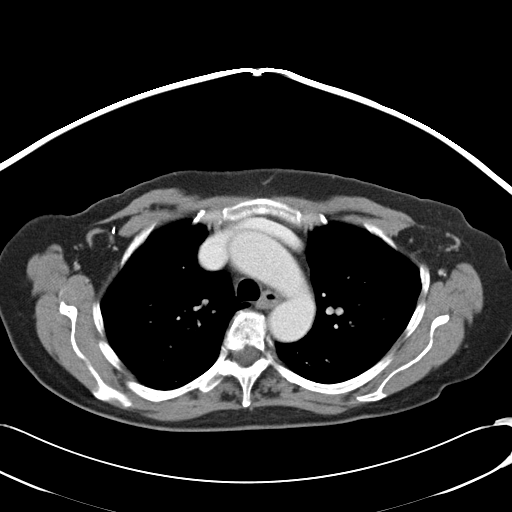
[im 109/116  soft-tissue]
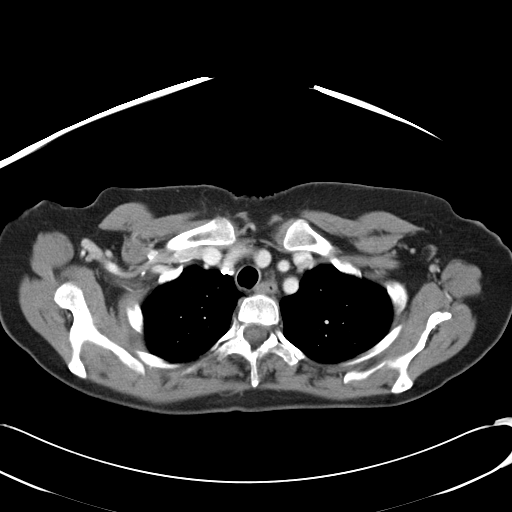

[Series 602: <mpr range> · coronal · 1.16mm/px · 3 of 124 slices shown]
[im 42/124  soft-tissue]
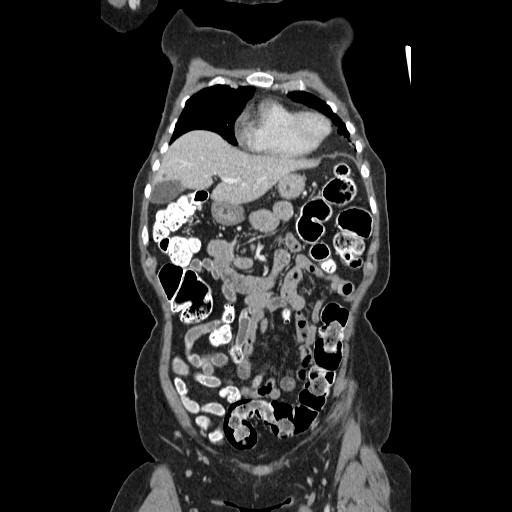
[im 55/124  soft-tissue]
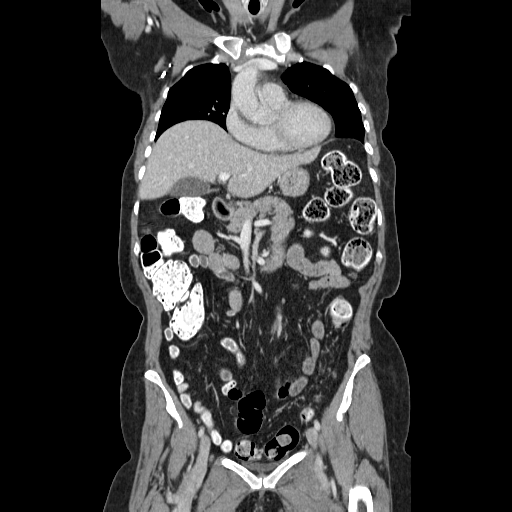
[im 69/124  soft-tissue]
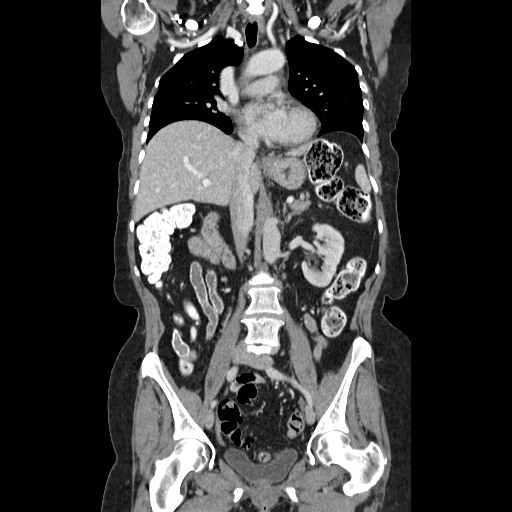

[16 of 46 positions shown; findings below may reference images not displayed]

FINDINGS: Bilateral mastectomies.  No evidence of axillary or
supraclavicular lymphadenopathy.  Small hypodensity within the
right thyroid gland is unchanged.  No mediastinal or hilar
lymphadenopathy.  No pericardial effusion.  No new or suspicious
pulmonary nodules.  5 mm subpleural nodule along the cardiac border
within the lingula (image 23) is unchanged.  Airways appear normal.
IMPRESSION: 1.  No evidence of breast cancer recurrence.
2.  Stable small lingular pulmonary nodule.

CT ABDOMEN
FINDINGS: Multiple low-density lesions within the liver have not
changed in size or number compared to prior.  The largest lesions
within the posterior right hepatic lobe measures 15 mm x 12 mm.
This lesion does not have simple fluid attenuation but again is not
changed from prior may represent a complex cyst.  The gallbladder,
pancreas, spleen, adrenal glands, kidneys are unchanged.  There are
simple cysts within the left kidney.

The stomach, small bowel, cecum appear normal.  Colon appears
normal.

Abdominal aorta is normal caliber.  No retroperitoneal or
peritoneal lymphadenopathy.
IMPRESSION: 1.  Stable hypodensities in the liver.
2.  No evidence of breast cancer recurrence

CT PELVIS
FINDINGS: Bladder appears normal.  Post hysterectomy anatomy.  The
rectum and sigmoid colon appear normal.  There are diverticula the
sigmoid colon.

No evidence of pelvic lymphadenopathy. Review of  bone windows
demonstrates no aggressive osseous lesions.
IMPRESSION: 1.  No evidence of breast cancer metastasis.
2. Diverticulosis without evidence of acute diverticulitis.

## 2009-04-28 ENCOUNTER — Telehealth: Payer: Self-pay | Admitting: Internal Medicine

## 2009-07-21 ENCOUNTER — Telehealth: Payer: Self-pay | Admitting: Internal Medicine

## 2009-07-22 ENCOUNTER — Ambulatory Visit: Payer: Self-pay | Admitting: Internal Medicine

## 2009-07-22 DIAGNOSIS — M549 Dorsalgia, unspecified: Secondary | ICD-10-CM | POA: Insufficient documentation

## 2009-07-22 LAB — CONVERTED CEMR LAB
Bilirubin Urine: NEGATIVE
Glucose, Urine, Semiquant: NEGATIVE
Ketones, urine, test strip: NEGATIVE
Protein, U semiquant: NEGATIVE
pH: 5.5

## 2009-08-19 ENCOUNTER — Ambulatory Visit: Payer: Self-pay | Admitting: Family Medicine

## 2009-10-21 ENCOUNTER — Encounter: Payer: Self-pay | Admitting: Internal Medicine

## 2010-01-31 ENCOUNTER — Encounter: Payer: Self-pay | Admitting: Internal Medicine

## 2010-02-21 ENCOUNTER — Encounter: Payer: Self-pay | Admitting: Internal Medicine

## 2010-04-07 ENCOUNTER — Ambulatory Visit: Payer: Self-pay | Admitting: Internal Medicine

## 2010-04-07 DIAGNOSIS — J157 Pneumonia due to Mycoplasma pneumoniae: Secondary | ICD-10-CM | POA: Insufficient documentation

## 2010-04-07 LAB — CONVERTED CEMR LAB
CO2: 27 meq/L (ref 19–32)
Calcium: 8.8 mg/dL (ref 8.4–10.5)
GFR calc non Af Amer: 86.44 mL/min (ref >60)
Sodium: 139 meq/L (ref 135–145)

## 2010-04-11 ENCOUNTER — Telehealth: Payer: Self-pay | Admitting: Internal Medicine

## 2010-04-27 ENCOUNTER — Ambulatory Visit: Payer: Self-pay | Admitting: Internal Medicine

## 2010-04-27 IMAGING — CT CT CHEST W/ CM
2 of 4 series · 15 of 36 positions shown, 18 images · IV contrast (agent unspecified)
Comparison: 04/27/2009

CT CHEST

CLINICAL DATA: Breast carcinoma.  Follow up indeterminate
pulmonary nodule and liver lesions.

CT CHEST, ABDOMEN AND PELVIS WITH CONTRAST
TECHNIQUE: Multidetector CT imaging of the chest, abdomen and
pelvis was performed following the standard protocol during bolus
administration of intravenous contrast.
Contrast: 100 ml 5mnipaque-PVV and oral contrast

[Series 2: cap with · axial · 0.66mm/px · z∈[+582,+1112]mm · 12 of 118 slices shown, 15 images]
[im 6/118  mediastinal]
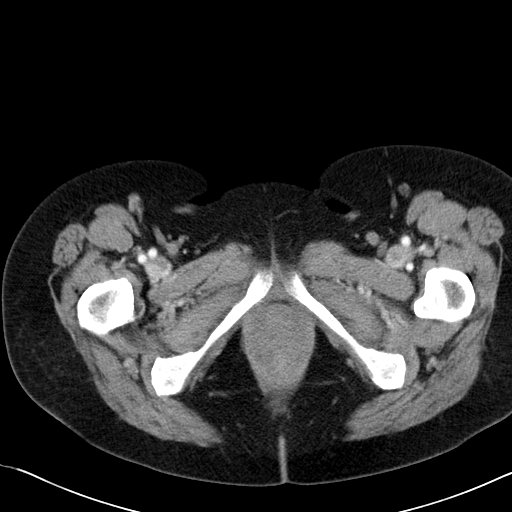
[im 6/118  lung]
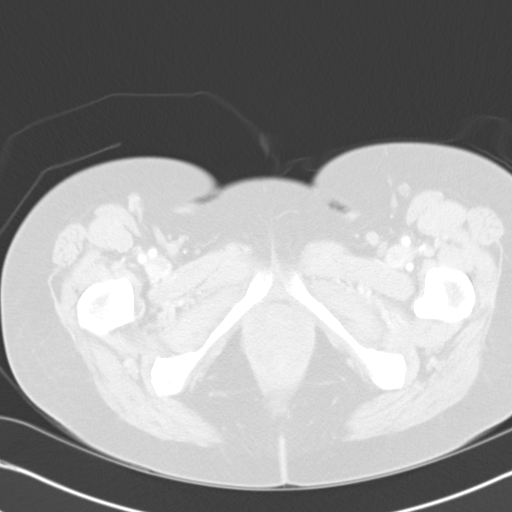
[im 16/118  lung]
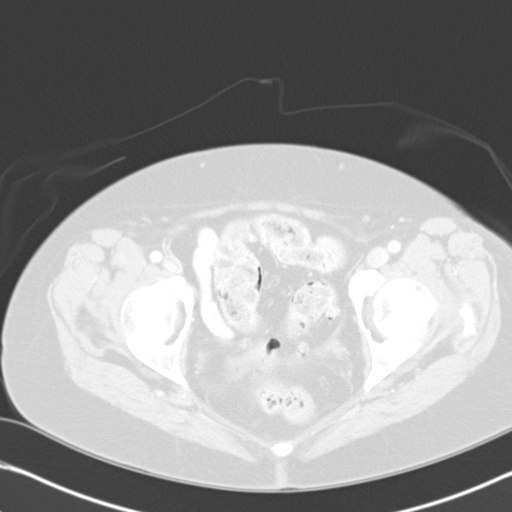
[im 27/118  lung]
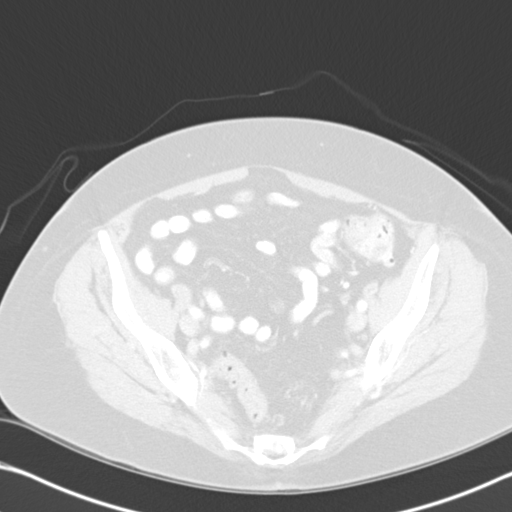
[im 38/118  lung]
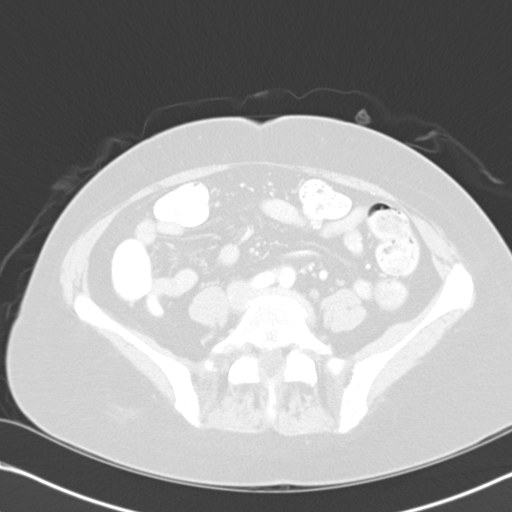
[im 43/118  mediastinal]
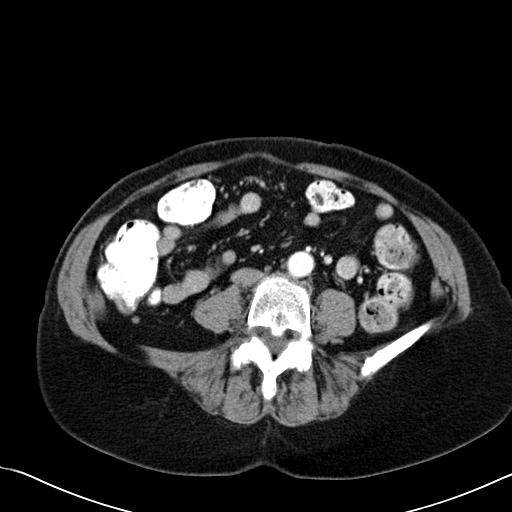
[im 43/118  lung]
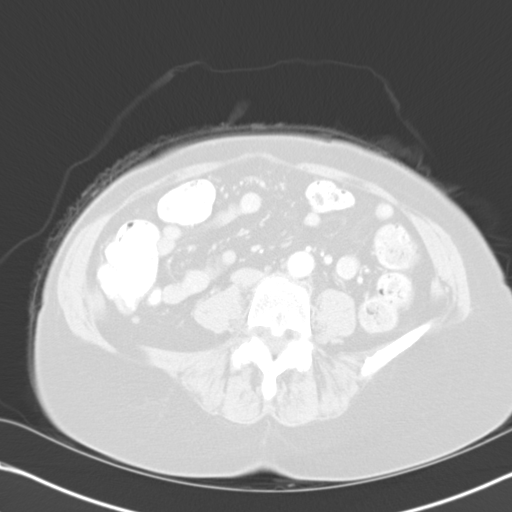
[im 54/118  lung]
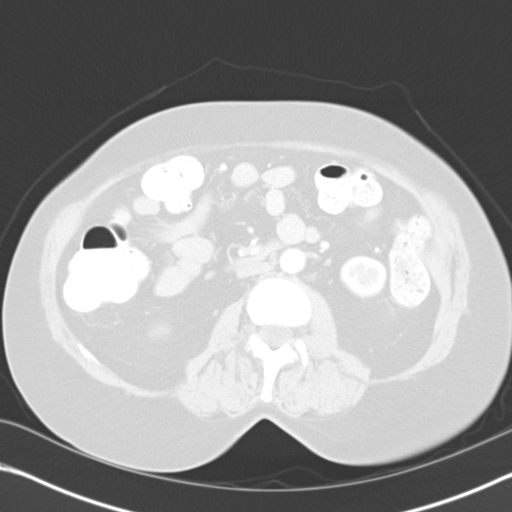
[im 64/118  lung]
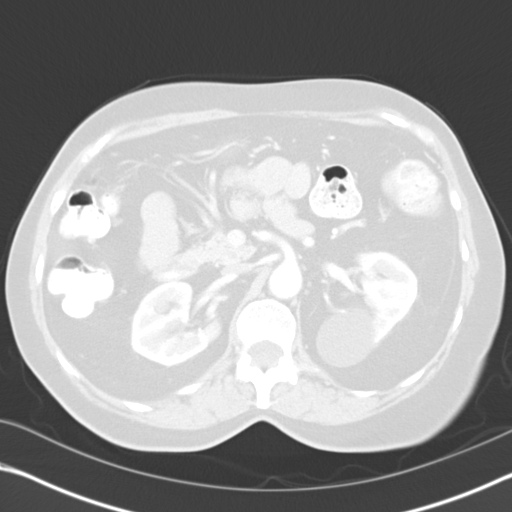
[im 75/118  lung]
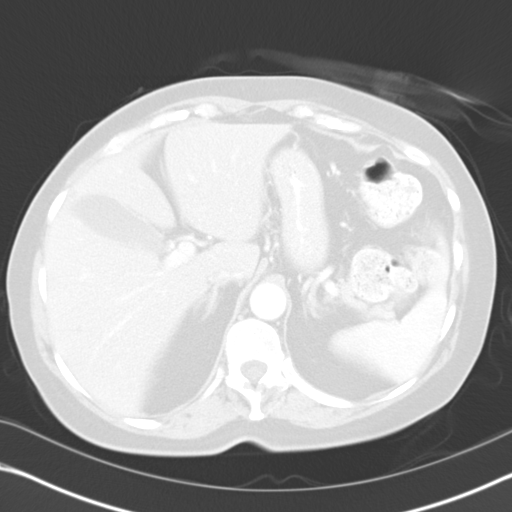
[im 80/118  mediastinal]
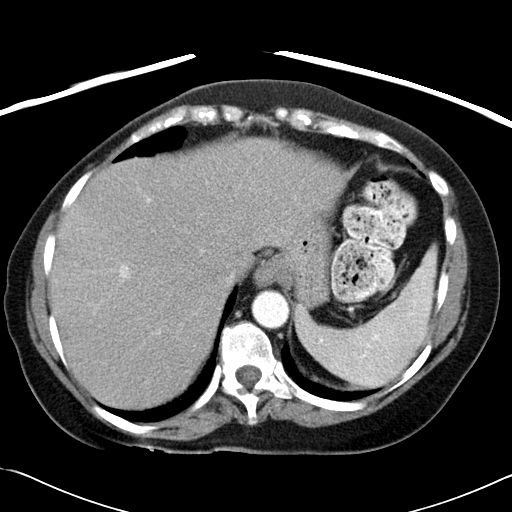
[im 80/118  lung]
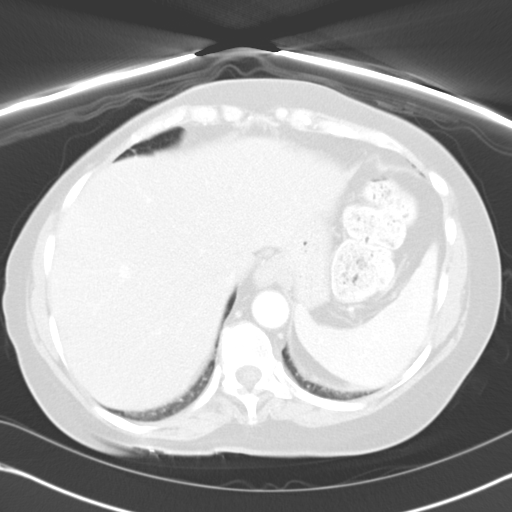
[im 91/118  lung]
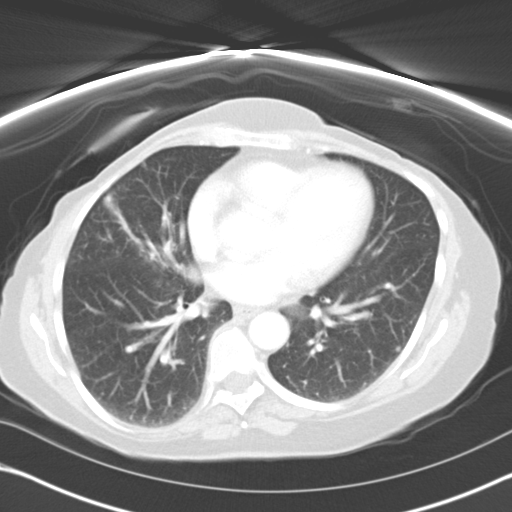
[im 102/118  lung]
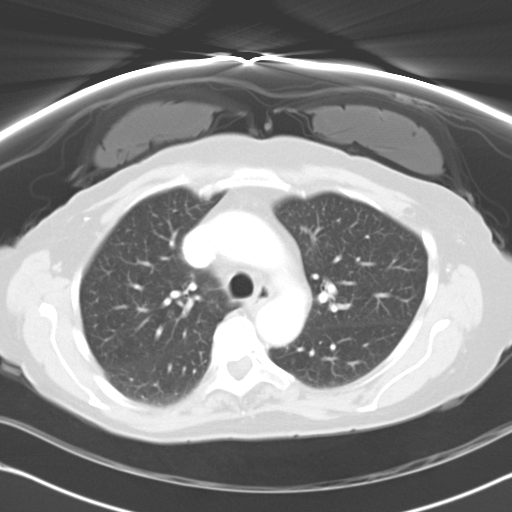
[im 112/118  lung]
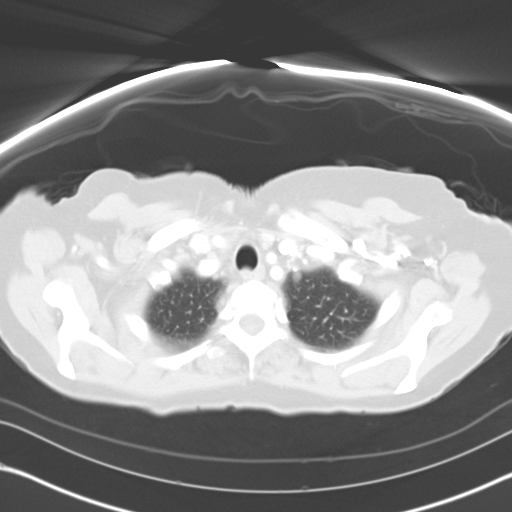

[Series 602: <mpr thick range> · coronal · 1.19mm/px · 3 of 84 slices shown]
[im 17/84  lung]
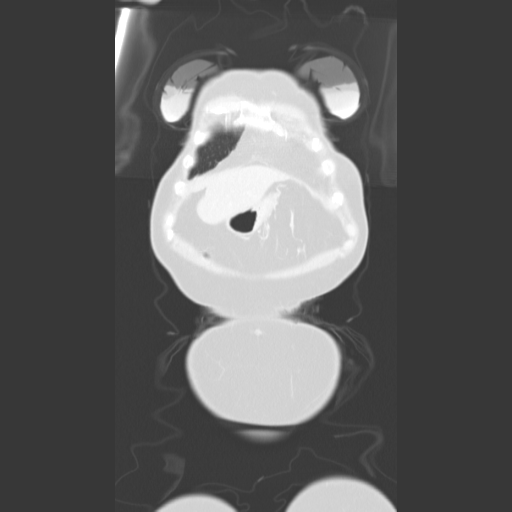
[im 34/84  lung]
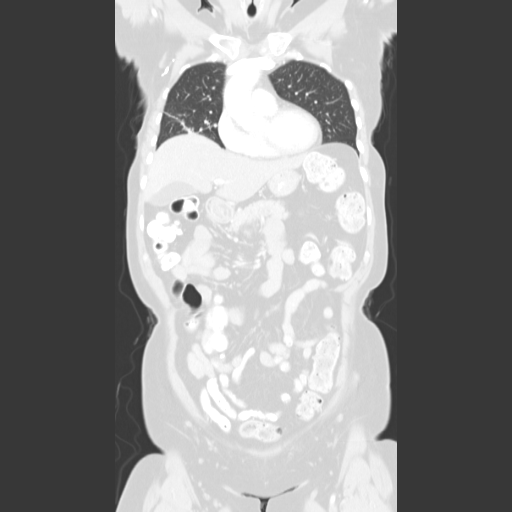
[im 50/84  lung]
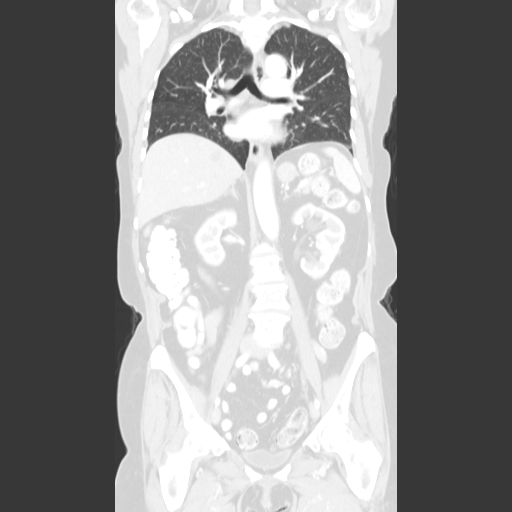

[15 of 36 positions shown; findings below may reference images not displayed]

FINDINGS: Previously noted tiny lingular pulmonary nodule along
the left heart border is not visualized on current exam.  A 4 mm
nodule in the peripheral left lower lobe on image 28 and several
other even smaller pleural-based nodules in the lateral left lower
lobe all remains stable.  A 4 mm subpleural nodule  in the right
lower lobe on image 24 is also unchanged.  No new or enlarging
pulmonary nodules or masses are identified.

No evidence of pulmonary infiltrate or pleural effusion.  No
evidence of mediastinal or hilar masses.  No adenopathy seen
elsewhere within the thorax.  No suspicious bone lesions are
identified.
IMPRESSION: Stable exam.  No evidence of metastatic disease or other acute
findings.

CT ABDOMEN AND PELVIS
FINDINGS: Several small low attenuation lesions in the liver are
all stable, consistent with benign etiology.  No new or enlarging
liver masses are identified.  The gallbladder, adrenal glands,
pancreas, and spleen are normal in appearance.  Left renal cysts
are stable and there is no evidence of renal mass or
hydronephrosis.

Shotty retroperitoneal lymph nodes in the left para-aortic region
are stable, none of which are pathologically enlarged.  No
pathologically enlarged nodes or other masses are identified within
the abdomen or pelvis.

Previous hysterectomy noted.  Sigmoid diverticulosis again noted.
No evidence of diverticulitis or other inflammatory process.  No
evidence of abscess or ascites.
IMPRESSION: 1.  Stable exam.  No evidence of metastatic disease or other acute
findings.
2.  Diverticulosis.  No radiographic evidence of diverticulitis.

## 2010-04-28 ENCOUNTER — Telehealth: Payer: Self-pay | Admitting: Internal Medicine

## 2010-06-23 NOTE — Assessment & Plan Note (Signed)
Summary: head and chest congestion/cough/cjr   Vital Signs:  Patient profile:   65 year old female Temp:     98.2 degrees F oral BP sitting:   100 / 70  (left arm) Cuff size:   regular  Vitals Entered By: Sid Falcon LPN (August 19, 2009 2:32 PM) CC: head, chest congestion several weeks   History of Present Illness: Patient seen with one to two-week history of sinus congestion and postnasal drip. Mild mostly nonproductive cough. No fever. No purulent drainage. Similar symptoms last spring. Questions allergies. Has tried over-the-counter decongestants without improvement.  Allergies (verified): No Known Drug Allergies  Past History:  Past Medical History: Last updated: 06/27/2007 hx of gastritis Breast cancer, hx of Hyperlipidemia Osteopenia Diverticulosis, colon  Social History: Last updated: 06/27/2007 Married Never Smoked Drug use-no PMH reviewed for relevance, SH/Risk Factors reviewed for relevance  Review of Systems  The patient denies fever, hoarseness, prolonged cough, and headaches.    Physical Exam  General:  Well-developed,well-nourished,in no acute distress; alert,appropriate and cooperative throughout examination Eyes:  pupils round, pupils reactive to light, pupils react to accomodation, and no injection.   Ears:  External ear exam shows no significant lesions or deformities.  Otoscopic examination reveals clear canals, tympanic membranes are intact bilaterally without bulging, retraction, inflammation or discharge. Hearing is grossly normal bilaterally. Nose:  minimal mucosal erythema Mouth:  Oral mucosa and oropharynx without lesions or exudates.  Teeth in good repair. Neck:  No deformities, masses, or tenderness noted. Lungs:  Normal respiratory effort, chest expands symmetrically. Lungs are clear to auscultation, no crackles or wheezes. Heart:  Normal rate and regular rhythm. S1 and S2 normal without gallop, murmur, click, rub or other extra  sounds.   Impression & Recommendations:  Problem # 1:  ALLERGIC RHINITIS (ICD-477.9) OTC antihistamine and Flonase nasal.  Pt will try irrigation with Netti pot. Her updated medication list for this problem includes:    Fluticasone Propionate 50 Mcg/act Susp (Fluticasone propionate) .Marland Kitchen... 2 sprays per nostril once daily  Complete Medication List: 1)  Lipitor 20 Mg Tabs (Atorvastatin calcium) .Marland Kitchen.. 1 by mouth q days 2)  Evista 60 Mg Tabs (Raloxifene hcl) .... Once daily 3)  Adult Aspirin Ec Low Strength 81 Mg Tbec (Aspirin) .... One by mouth daily 4)  Fluticasone Propionate 50 Mcg/act Susp (Fluticasone propionate) .... 2 sprays per nostril once daily  Patient Instructions: 1)  consider the use of Nettie pot for sinus irrigation 2)  continue use of antihistamine such as allegra or zyrtec. Prescriptions: FLUTICASONE PROPIONATE 50 MCG/ACT SUSP (FLUTICASONE PROPIONATE) 2 sprays per nostril once daily  #1 x 3   Entered and Authorized by:   Evelena Peat MD   Signed by:   Evelena Peat MD on 08/19/2009   Method used:   [facility] [phone]* (retail)       [address]       [place], [place]  [address]       Ph: [phone] or [phone]       Fax: [phone]   RxID:   [id number]

## 2010-06-23 NOTE — Progress Notes (Signed)
Summary: Call-A-Nurse Report    Call-A-Nurse Triage Call Report Triage Record Num: [id number] Operator: Ether Griffins Patient Name: Diane Ingram Call Date & Time: 04/10/2010 11:34:42AM Patient Phone: [phone] PCP: Darryll Capers Patient Gender: Female PCP Fax : [phone] Patient DOB: 07/23/1945 [facility] Name[facility] Reason for Call: Calling about still running fever.Temp 101 and went up to 101.9 last night.Dx with walking pneumonia and started on antibx 04/07/10.Also having diarrhea,nausea,H/A.Advised to go to UC. Protocol(s) Used: Fever - Adult Recommended Outcome per Protocol: See Provider within 4 hours Reason for Outcome: Temperature of 101.5 F (38.6 C) or greater that has not responded to 24 hours of home care measures Care Advice: It takes 20-60 minutes for fever reducing meds to work. Take your temperature 1-2 hours after taking one of these medications to check if they are working.  ~  ~ HEALTH PROMOTION / MAINTENANCE  ~ SYMPTOM / CONDITION MANAGEMENT  ~ CAUTIONS Most adults need to drink 6-10 eight-ounce glasses (1.2-2.0 liters) of fluids per day unless previously told to limit fluid intake for other medical reasons. Limit fluids that contain caffeine, sugar or alcohol. Urine will be a very light yellow color when you drink enough fluids.  ~ COMFORT MEASURES FOR A FEVER: - Drink cool liquids or eat ice chips or popsicles. Avoid drinks with alcohol or caffeine. - Wear one layer of light-weight clothing. - Consider using a fan to improve circulation. - Rest until temperature returns to normal and other symptoms improve. - Use a lightweight blanket or other bedding. - A lukewarm (not cold) bath or shower can help lower body temperature. Cold water can cause shivering and raise temperature. If shivering starts, dry off and cover with lightweight clothing.  ~ Analgesic/Antipyretic Advice - Acetaminophen: Consider acetaminophen as directed on label or by  pharmacist/provider for pain or fever PRECAUTIONS: - Use if there is no history of liver disease, alcoholism, or intake of three or more alcohol drinks per day - Only if approved by provider during pregnancy or when breastfeeding - During pregnancy, acetaminophen should not be taken more than 3 consecutive days without telling provider - Do not exceed recommended dose or frequency  ~ Systemic Inflammatory Response Syndrome (SIRS): Watch for signs of a generalized, whole body infection. Occurs within days of a localized infection, especially of the urinary, GI, respiratory or nervous systems; or after a traumatic injury or invasive procedure. - Call EMS 911 if symptoms have worsened, such as increasing confusion or unusual drowsiness; cold and clammy skin; no urine output; rapid respiration (>30/min.) or slow respiration (<10/min.); struggling to breathe. - Go to the ED immediately for early symptoms of rapid pulse >90/min. or rapid breathing >20/min. at rest; chills; oral temperature >100.4 F (38 C) or <96.8 F (36 C) when associated with conditions noted.  ~ 04/10/2010 11:49:59AM Page 1 of 1 CAN_TriageRpt_V2

## 2010-06-23 NOTE — Letter (Signed)
Summary: [facility]  [facility]   Imported By: Maryln Gottron 02/28/2010 13:17:23  _____________________________________________________________________  External Attachment:    Type:   Image     Comment:   External Document

## 2010-06-23 NOTE — Progress Notes (Signed)
Summary: Call-A-Nurse Report  Phone Note Call from Patient   Summary of Call: talked with pt and she went to urgent care and xray take and was dx with pneumonia-levaquin given adn states she is beginning to fell better- coughing up sputum- instructed to take tylenol every 4 hours as needed fever and call back if not better Initial call taken by: Willy Eddy, LPN,  April 11, 2010 8:53 AM      Call-A-Nurse Triage Call Report Triage Record Num: [id number] Operator: Durward Mallard DiMatteis Patient Name: Diane Ingram Call Date & Time: 04/09/2010 10:11:33AM Patient Phone: [phone] PCP: Darryll Capers Patient Gender: Female PCP Fax : [phone] Patient DOB: 03-May-1946 [facility] Name[facility] Reason for Call: Caller states that she was diagnosed with walking pneumonia and not getting any better; started on antibiotic Thursday; states that she has a h/a and nausea; temp 99 today; instructed that antibiotic takes 24-48 hr to get better on antibiotic; feeling a little better now; Homecare call back if sx worsen; if no better call office Monday Protocol(s) Used: Cough - Adult Recommended Outcome per Protocol: Provide Home/Self Care Reason for Outcome: Productive cough AND clear or white sputum Care Advice:  ~ Use a cool mist humidifier to moisten air. Be sure to clean according to manufacturer's instructions.  ~ SYMPTOM / CONDITION MANAGEMENT 04/09/2010 10:18:07AM Page 1 of 1 CAN_TriageRpt_V2

## 2010-06-23 NOTE — Letter (Signed)
Summary: [facility]  [facility]   Imported By: Maryln Gottron 03/16/2010 13:47:26  _____________________________________________________________________  External Attachment:    Type:   Image     Comment:   External Document

## 2010-06-23 NOTE — Letter (Signed)
Summary: [facility]   Imported By: Maryln Gottron 11/08/2009 13:15:40  _____________________________________________________________________  External Attachment:    Type:   Image     Comment:   External Document

## 2010-06-23 NOTE — Progress Notes (Signed)
Summary: low back pain  Phone Note Call from Patient   Caller: Patient Call For: Stacie Glaze MD Summary of Call: Pt is having low back pain with no UTI symptoms, but is asking if she can have a UA to r/o UTI before having her back evaluated? [phone] Initial call taken by: Lynann Beaver CMA,  July 21, 2009 2:48 PM  Follow-up for Phone Call        she can come in in am around 9:30am for ua Follow-up by: Willy Eddy, LPN,  July 21, 2009 2:49 PM  Additional Follow-up for Phone Call Additional follow up Details #1::        Pt notified and appt scheduled. Additional Follow-up by: Lynann Beaver CMA,  July 21, 2009 3:27 PM

## 2010-06-23 NOTE — Progress Notes (Signed)
Summary: REQ FOR RESULTS  Phone Note Call from Patient   Caller: Patient   [phone] Summary of Call: Pt called to obtain results of recent CT...... Advised that she can be reached at [phone]  or  [phone].  Initial call taken by: Debbra Riding,  April 28, 2010 1:42 PM  Follow-up for Phone Call        pt informed Follow-up by: Willy Eddy, LPN,  April 28, 2010 1:57 PM

## 2010-06-23 NOTE — Assessment & Plan Note (Signed)
Summary: follow up/cjr   Vital Signs:  Patient profile:   65 year old female Height:      65 inches Weight:      152 pounds BMI:     25.39 Temp:     98.2 degrees F oral Pulse rate:   76 / minute Resp:     14 per minute BP sitting:   110 / 70  (left arm)  Vitals Entered By: Willy Eddy, LPN (April 07, 2010 12:30 PM) CC: roa-c/o sore throat, cough and fever, URI symptoms Is Patient Diabetic? No   Primary Care Sharron Petruska:  Stacie Glaze MD  CC:  roa-c/o sore throat, cough and fever, and URI symptoms.  History of Present Illness:  URI Symptoms      This is a 65 year old woman who presents with URI symptoms.  The patient reports nasal congestion, sore throat, and productive cough, but denies clear nasal discharge, purulent nasal discharge, dry cough, earache, and sick contacts.  Associated symptoms include low-grade fever (<100.5 degrees).  The patient also reports itchy throat, headache, muscle aches, and severe fatigue.    Preventive Screening-Counseling & Management  Alcohol-Tobacco     Smoking Status: never     Passive Smoke Exposure: no  Problems Prior to Update: 1)  Walking Pneumonia  (ICD-483.0) 2)  Back Pain  (ICD-724.5) 3)  Special Screening For Malignant Neoplasms Colon  (ICD-V76.51) 4)  Diverticulosis, Colon  (ICD-562.10) 5)  Physical Examination  (ICD-V70.0) 6)  Advef, Drug/medicinal/biological Subst Nos  (ICD-995.20) 7)  Pulmonary Nodule  (ICD-518.89) 8)  Family History Breast Cancer 1st Degree Relative <50  (ICD-V16.3) 9)  Osteopenia  (ICD-733.90) 10)  Hepatic Cyst  (ICD-573.8) 11)  Hyperlipidemia  (ICD-272.4) 12)  Breast Cancer, Hx of  (ICD-V10.3) 13)  Osteoporosis  (ICD-733.00)  Current Problems (verified): 1)  Back Pain  (ICD-724.5) 2)  Special Screening For Malignant Neoplasms Colon  (ICD-V76.51) 3)  Diverticulosis, Colon  (ICD-562.10) 4)  Physical Examination  (ICD-V70.0) 5)  Advef, Drug/medicinal/biological Subst Nos  (ICD-995.20) 6)   Pulmonary Nodule  (ICD-518.89) 7)  Family History Breast Cancer 1st Degree Relative <50  (ICD-V16.3) 8)  Osteopenia  (ICD-733.90) 9)  Hepatic Cyst  (ICD-573.8) 10)  Hyperlipidemia  (ICD-272.4) 11)  Breast Cancer, Hx of  (ICD-V10.3) 12)  Osteoporosis  (ICD-733.00)  Medications Prior to Update: 1)  Lipitor 20 Mg Tabs (Atorvastatin Calcium) .Marland Kitchen.. 1 By Mouth Q Days 2)  Evista 60 Mg  Tabs (Raloxifene Hcl) .... Once Daily 3)  Adult Aspirin Ec Low Strength 81 Mg  Tbec (Aspirin) .... One By Mouth Daily  Current Medications (verified): 1)  Lipitor 20 Mg Tabs (Atorvastatin Calcium) .Marland Kitchen.. 1 By Mouth Q Days 2)  Evista 60 Mg  Tabs (Raloxifene Hcl) .... Once Daily 3)  Adult Aspirin Ec Low Strength 81 Mg  Tbec (Aspirin) .... One By Mouth Daily 4)  Clarithromycin 500 Mg Xr24h-Tab (Clarithromycin) .... One By Mouth Two Times A Day  Hold The Lipitor While On This Drug 5)  Benzonatate 100 Mg Caps (Benzonatate) .... One By Mouth Q 6 Hours As Needed Cough  Allergies (verified): No Known Drug Allergies  Past History:  Family History: Last updated: 2007-02-15 father died melanoma at 40 mother Family History Breast cancer 1st degree relative <50 Family History of Arthritis  Social History: Last updated: 06/27/2007 Married Never Smoked Drug use-no  Risk Factors: Smoking Status: never (04/07/2010) Passive Smoke Exposure: no (04/07/2010)  Past medical, surgical, family and social histories (including risk factors)  reviewed, and no changes noted (except as noted below).  Past Medical History: Reviewed history from 06/27/2007 and no changes required. hx of gastritis Breast cancer, hx of Hyperlipidemia Osteopenia Diverticulosis, colon  Past Surgical History: Reviewed history from 06/27/2007 and no changes required.  Colonoscopy-12/12/2002  with barium enema EGD-10/23/2002 Hysterectomy Mastectomy  bilateral  Family History: Reviewed history from 02/11/2007 and no changes required. father  died melanoma at 31 mother Family History Breast cancer 1st degree relative <50 Family History of Arthritis  Social History: Reviewed history from 06/27/2007 and no changes required. Married Never Smoked Drug use-no  Review of Systems       The patient complains of hoarseness and prolonged cough.  The patient denies anorexia, fever, weight loss, weight gain, vision loss, decreased hearing, chest pain, syncope, dyspnea on exertion, peripheral edema, headaches, hemoptysis, abdominal pain, melena, hematochezia, severe indigestion/heartburn, hematuria, incontinence, genital sores, muscle weakness, suspicious skin lesions, transient blindness, difficulty walking, depression, unusual weight change, abnormal bleeding, enlarged lymph nodes, angioedema, and breast masses.    Physical Exam  General:  Well-developed,well-nourished,in no acute distress; alert,appropriate and cooperative throughout examination Eyes:  pupils round, pupils reactive to light, pupils react to accomodation, and no injection.   Nose:  mucosal erythema, mucosal edema, and airflow obstruction.   Mouth:  posterior lymphoid hypertrophy and postnasal drip.   Neck:  No deformities, masses, or tenderness noted. Lungs:  normal respiratory effort and no wheezes.   Heart:  normal rate and regular rhythm.   Abdomen:  soft and normal bowel sounds.   Extremities:  No clubbing, cyanosis, edema, or deformity noted with normal full range of motion of all joints.   Neurologic:  alert & oriented X3 and gait normal.     Impression & Recommendations:  Problem # 1:  PULMONARY NODULE (ICD-518.89)  has been stable will preform one more CT if remains stable would not repeat  Orders: Radiology Referral (Radiology)  Problem # 2:  HEPATIC CYST (ICD-573.8)  Orders: Radiology Referral (Radiology)  Problem # 3:  WALKING PNEUMONIA (ICD-483.0) she has difffuse finding consistant with atypical pnemonia close observation needed instructed  patient to complete antibiotics, and call for worsened shortness of breath or new symptoms.   Her updated medication list for this problem includes:    Clarithromycin 500 Mg Xr24h-tab (Clarithromycin) ..... One by mouth two times a day  hold the lipitor while on this drug  Problem # 4:  PULMONARY NODULE (ICD-518.89)  Orders: Radiology Referral (Radiology)  Complete Medication List: 1)  Lipitor 20 Mg Tabs (Atorvastatin calcium) .Marland Kitchen.. 1 by mouth q days 2)  Evista 60 Mg Tabs (Raloxifene hcl) .... Once daily 3)  Adult Aspirin Ec Low Strength 81 Mg Tbec (Aspirin) .... One by mouth daily 4)  Clarithromycin 500 Mg Xr24h-tab (Clarithromycin) .... One by mouth two times a day  hold the lipitor while on this drug 5)  Benzonatate 100 Mg Caps (Benzonatate) .... One by mouth q 6 hours as needed cough  Other Orders: Venipuncture (56213) Specimen Handling (08657) TLB-BMP (Basic Metabolic Panel-BMET) (80048-METABOL)  Patient Instructions: 1)  March  welcome to medicare visit Prescriptions: BENZONATATE 100 MG CAPS (BENZONATATE) one by mouth q 6 hours as needed cough  #30 x 0   Entered and Authorized by:   Stacie Glaze MD   Signed by:   Lynann Beaver CMA AAMA on 04/07/2010   Method used:   [facility]        [facility]  [address] [phone]* (retail)  [address]       [place], [place]  [address]       Ph: [phone] or [phone]       Fax: [phone]   RxID:   [id number] CLARITHROMYCIN 500 MG XR24H-TAB (CLARITHROMYCIN) one by mouth two times a day  hold the lipitor while on this drug  #20 x 0   Entered and Authorized by:   Stacie Glaze MD   Signed by:   Lynann Beaver CMA AAMA on 04/07/2010   Method used:   [facility] to        [facility]  [address] [phone]* (retail)       [address]       [place], [place]  [address]       Ph: [phone] or [phone]       Fax: [phone]   RxID:   [id number]    Orders  Added: 1)  Est. Patient Level IV [20254] 2)  Venipuncture [27062] 3)  Specimen Handling [99000] 4)  Radiology Referral [Radiology] 5)  TLB-BMP (Basic Metabolic Panel-BMET) [80048-METABOL]

## 2010-07-31 ENCOUNTER — Other Ambulatory Visit: Payer: Self-pay | Admitting: Internal Medicine

## 2010-09-28 ENCOUNTER — Encounter (INDEPENDENT_AMBULATORY_CARE_PROVIDER_SITE_OTHER): Payer: Self-pay | Admitting: Surgery

## 2010-10-04 ENCOUNTER — Telehealth: Payer: Self-pay | Admitting: Internal Medicine

## 2010-10-04 NOTE — Telephone Encounter (Signed)
Pt called req cheaper med than Generic Lipitor 20 mg. Pt cheaper alternative med to be called in to [facility] [phone]

## 2010-10-04 NOTE — Telephone Encounter (Signed)
Please advise 

## 2010-10-05 ENCOUNTER — Other Ambulatory Visit: Payer: Self-pay | Admitting: *Deleted

## 2010-10-05 MED ORDER — PRAVASTATIN SODIUM 40 MG PO TABS: 40.0000 mg | ORAL_TABLET | Freq: Every evening | ORAL | Status: DC

## 2010-10-05 NOTE — Telephone Encounter (Signed)
Change to pravachol 40

## 2010-10-07 NOTE — H&P (Signed)
[facility]  Patient:    Diane Ingram, Diane Ingram Visit Number: [id number] MRN: [id number]          Service Type: GYN Location: 4E 0427 01 Attending Physician:  Katrina Stack Admit Date:  08/31/2000   CC:         Diane Ingram, [place], [place], [place]   History and Physical  HISTORY OF PRESENT ILLNESS:  Diane Ingram is a 65 year old, gravida 2, para 2, with pelvic organ prolapse of increasingly severe degree.  She also has abnormal uterine bleeding and extreme cancer phobia of ovarian cancer.  She has a history of vaginal delivery x 2 with spontaneous vaginal delivery but has significant obstetric injury and damage contributed to by continuous straining at stools since childbirth, long history of constipation. She is under the primary care of Diane Ingram at [facility], now admitted for grade III cystocele, rectocele, enterocele with increasingly severe pelvic support problems, symptomatic.  She does not have incontinence of urine.  With elevation of her vesicle neck, she still does not have incontinence of urine, seems to have good preservation of the vesicle neck.  She is now admitted for vaginal hysterectomy, salpingo-oophorectomy, if possible anterior posterior colporrhaphy enterocele repairs and vaginal vault suspension.  PAST MEDICAL HISTORY:  Usual childhood diseases without sequelae. Medical Illnesses:  Denies significant past medical history.  She has had excision of skin cancer from her nose in [place].  PRESENT MEDICATIONS:  None.  ALLERGIES:  None known.  SOCIAL HISTORY:  The patient is recently divorced, much emotional stress over same.  Her children are grown and gone.  FAMILY HISTORY:  Mother and father living and well, but mother has history of breast cancer; father has history of melanoma.  REVIEW OF SYSTEMS:  HEENT:  Denies symptoms.  Cardiopulmonary: Denies asthma, cough, bronchitis, shortness of breath.  GI/GU:  Denies frequency, urgency, dysuria, change in bowel habits, food intolerance.  PHYSICAL EXAMINATION:  GENERAL:  Well-developed, well-nourished white female.  HEENT:  PERRLA.  Fundi benign.  Oropharynx clear.  NECK:  Supple without mass or thyroid enlargement.  CHEST:  Clear to percussion and auscultation.  HEART:  Regular rhythm without murmur or cardiac enlargement.  ABDOMEN:  Rather lax abdominal wall, protuberant abdomen but without significant panniculus.  No organomegaly.  PELVIC:  External genitalia normal female.  Vagina exceedingly poor pelvic support with large central fascial defect approximately 4 cm in diameter.  She also has enterocele and rectocele grade III and descent of the uterus to the introitus with straining or with tenaculum.  Rectovaginal exam confirms.  She has significant detachment of the perineal body from levator group with descent of her perineum with straining.  She does not have significant evidence of paravaginal defect and no evidence of incontinence.  Uterus is normal size, shape, and contour.  Adnexa clear.  Rectovaginal confirms.  EXTREMITIES:  Negative.  NEUROLOGIC:  Physiologic.  IMPRESSION: 1. Pelvic support problems with grade III cystocele, rectocele, enterocele,    grade II uterine decensus. 2. Abnormal uterine bleeding.  PLAN: Vaginal hysterectomy, bilateral salpingo-oophorectomy, anterior and posterior colporrhaphy, enterocele repair, vaginal vault suspension.  The patient understands the risks, benefits, ratio of the procedure, the alternatives, the need to avoid straining and lifting and straining at stool always to preserve the integrity of this repair. Attending Physician:  Katrina Stack DD:  09/02/00 TD:  09/02/00 Job: [id number]

## 2010-10-07 NOTE — Op Note (Signed)
[facility]  Patient:    Diane Ingram, Diane Ingram                  MRN: [id number] Proc. Date: 08/31/00 Adm. Date:  [id number] Attending:  Katrina Stack                           Operative Report  PREOPERATIVE DIAGNOSES: 1. Severe pelvic organ prolapse with cystocele, rectocele, enterocele,    grade 3. 2. Uterine descensus. 3. Abnormal uterine bleeding.  POSTOPERATIVE DIAGNOSES:  PROCEDURE:  Vaginal hysterectomy, bilateral salpingo-oophorectomy, anterior colporrhaphy, rectocele and enterocele repair and colposuspension.  SURGEON:  Gretta Cool, M.D.  ASSISTANT:  Raynald Kemp, M.D.  ANESTHESIA:  General endotracheal.  DESCRIPTION OF PROCEDURE:  Under excellent general anesthesia, with the patient prepped and draped in lithotomy position, the cervix was grasped with a single-tooth tenaculum and pulled down into view.  It was then infiltrated with saline to help define tissue planes.  The cervical mucosa was then incised and the mucosa pushed off the lower uterine segment.  The cul-de-sac was then entered and the weighted speculum placed in the cul-de-sac on top of the cuff.  Uterosacral and cardinal ligaments were then progressively clamped, cut, sutured and tied with 0 Vicryl.  The bladder peritoneum was then opened and a Deaver placed beneath the bladder.  The uterine vessels were then also clamped, cut, sutured and tied with 0 Vicryl.  The uterus was then inverted and the adnexal pedicles clamped across.  The uterus was then removed entirely by transection of those pedicles.  At this point, the ovaries were identified and pulled down into view with some difficulty.  The pedicle was reclamped across the infundibulopelvic ligament and then sutured with 0 Vicryl.  A second free tie was used to doubly ligate the infundibulopelvic pedicle.  At this point, the pedicles were all dry.  A suture of 0 Monocryl was then used to provide a pursestring  suture around the peritoneum so as to ligate the peritoneum high, particularly across the cul-de-sac.  A cardinal-uterosacral complex suspension of the vaginal cuff was then placed.  At this point, the anterior repair was undertaken.  There was an enormous central fascial defect in the anterior vaginal wall at approximately the level of the upper third of the vagina.  The mucosa was then incised and the mucosa dissected from the endopelvic fascia.  The defect was then identified and a pursestring suture placed initially, then interrupted sutures of 2-0 Vicryl were used to plicate the endopelvic fascia so as to completely close the central fascial defect of the anterior vaginal wall.  The mucosa was then trimmed and the upper layers of endopelvic fascia and mucosa closed a subcuticular closure.  The uterosacral-cardinal complex suture was then tied so as to pull the cuff into high intra-abdominal position and suspend the vagina to provide excellent level 1 support.  At this point, attention was turned to the rectocele repair and further enterocele repair.  Mucosa was incised from the introitus to the apex of the vaginal cuff and the mucosa reflected from the endopelvic fascia, or the perirectal fascia.  The enormous rectocele and enterocele were noted to be produced by a transverse fascial tear with descent of the fascia to the level of the lower third of the vagina.  There were also two lateral tears in the fascia.  A suture of 0 Vicryl was used to plicate the fascia to  the uterosacral-cardinal complex suspension.  The two lateral defects were then closed with a running suture of #0 Vicryl as well.  At this point, the rectocele was completely reduced, as was the enterocele, and a complete envelop of endopelvic fascia was re-created.  The mucosa was then trimmed and the upper layers of perirectal fascia and mucosa were closed as a subcuticular closure from the apex of the vagina to the  introitus.  Perineal body muscles were then rebuilt with a continuous suture of 2-0 Vicryl.  At the end of the procedure, the introitus was adequate by digital and quite adequate in length. The rectal exam during the procedure confirmed complete repair of the detachment of the perineal body from the levator fascia.  At this point, a Bonnano suprapubic Cystocath was placed and secured with 0 Ethibond.  At this point, the procedure was terminated without complication and patient returned to the recovery room in excellent condition. DD:  08/31/00 TD:  08/31/00 Job: [id number]

## 2010-10-07 NOTE — Consult Note (Signed)
[place]  Patient:    Diane Ingram, Diane Ingram Visit Number: [id number] MRN: [id number]          Service Type: SUR Location: RCRM 2550 07 Attending Physician:  Janalyn Rouse Dictated by:   Everardo Beals Juanda Chance, M.D. [facility] Proc. Date: 09/18/01 Admit Date:  09/18/2001                            Consultation Report  CONSULTING PHYSICIAN:  Theron Arista R. Maple Hudson, M.D.  REASON FOR CONSULTATION:  Postoperative chest pain.  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  Her chest tightness was transient and now she does not feel any chest discomfort and because of some of the medications she had, does not have recollection for her previous chest symptoms.  She has no prior history of known heart disease although she did indicate she may have had a murmur as a young adult.  PAST MEDICAL HISTORY:  Significant for previous hysterectomy.  She has no history of hypertension, diabetes.  She does not know her lipid status and does not smoke.  MEDICATIONS:  She is not on any medications.  ALLERGIES:  ASPIRIN because it aggravates ulcers.  SOCIAL HISTORY:  She is divorced, has two children.  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  She is under a great deal of stress recently because her father is dying in the hospice unit.  FAMILY HISTORY:  Positive in that she had a cousin who had a myocardial infarction and a maternal grandfather who died of a myocardial infarction, and she has a maternal aunt who has coronary disease and what sounds like angioplasties.  For details of her family history and for review of systems, please see Nuala Alpha complete note.  PHYSICAL EXAMINATION:  VITAL SIGNS:  Blood pressure 120/70, pulse 70, respirations 18, temperature 97.6.  NECK:  There was no venous distention.  The  carotid pulses were full and there were no bruits.  The thyroid is not enlarged.  CHEST:  Clear without rales or rhonchi.  HEART:  The cardiac rhythm was regular.  The first and second heart sounds were normal.  Exam was somewhat limited by the bandages.  There was a 2/6 systolic ejection murmur.  ABDOMEN:  Soft with normal bowel sounds.  There is no hepatosplenomegaly.  PULSES:  Peripheral pulses were full and there is no peripheral edema.  MUSCULOSKELETAL:  Showed no deformities.  BREAST:  She had bandages over both breasts from her recent surgery.  NEUROLOGIC:  Nonfocal.  SKIN:  Warm and dry.  LABORATORY DATA:  Her EKG showed some PVCs on telemetry and the 12-lead EKG was normal.  IMPRESSION: 1. Postoperative chest tightness, probably musculoskeletal. 2. Status post bilateral mastectomies. 3. Premature ventricular contractions. 4. Situational stress.  RECOMMENDATIONS:  I suspect that Ms. Yatess symptoms are probably not cardiac related.  We will recommend monitoring on telemetry and getting serial enzymes.  If these studies are negative then I recommend further evaluation as an outpatient with an echocardiogram and a stress Cardiolite. Dictated by:   Everardo Beals Juanda Chance, M.D. [facility] Attending Physician:  Janalyn Rouse DD:  09/18/01 TD:  09/18/01 Job: [id number]

## 2010-10-07 NOTE — Discharge Summary (Signed)
[facility]  Patient:    Diane Ingram, Diane Ingram                        MRN: [id number] Adm. Date:  08/31/00 Disc. Date: 09/02/00 Attending:  Gretta Cool, M.D. CC:         Dr. Christell Constant in [facility]   Discharge Summary  HISTORY OF PRESENT ILLNESS:  Ms. Dunckel is a 65 year old female, gravida 2, para 2, with pelvic organ prolapse of increasingly severe degree.  She has had a history of abnormal uterine bleeding and is extremely anxious over the fear of ovarian cancer.  She has a history of vaginal delivery x 2 with significant obstetric injury and damage contributed to continual straining of stool since childbirth and a long history of constipation.  She does not have incontinence of urine.  Upon examination, it is noted that she has a grade 3 cystocele, rectocele, and enterocele with increasing severe pelvic support problems that are symptomatic.  Also noted was elevation of vesical neck.  She still does not have incontinence of urine and seems to have good preservation of the vesical neck.  She is now admitted for vaginal hysterectomy, salpingo-oophorectomy, and, if possible anterior, posterior colporrhaphy, enterocele repairs, and vaginal vault suspension.  ADMISSION PHYSICAL EXAMINATION:  CHEST:  Clear to A&P.  HEART:  Rate and rhythm without murmur, gallop, or cardiac enlargement.  ABDOMEN:  Rather lax abdominal wall, protuberant abdomen without significant panniculus.  No organomegaly.  PELVIC:  External genitalia within normal limits for female vagina, exceedingly poor pelvic support, with large central fascial defect approximately 4 cm in diameter.  There is an enterocele and rectocele, grade 3, and distended the uterus to the introitus with straining or with tenaculum. Rectovaginal exam confirmed.  She has significant detachment of the perineal body from the levator group but ______ the perineum with straining.  There is no significant evidence  of paravaginal defect and no evidence of incontinence. The uterus is normal shape, size, and contour.  Adnexa are bilaterally clear. Rectovaginal exam confirms.  IMPRESSION: 1. Pelvic support problems with grade 3 cystocele, rectocele, and enterocele,    and grade 2 uterine descensus. 2. Abnormal uterine bleeding.  PLAN:  Vaginal hysterectomy, bilateral salpingo-oophorectomy, anterior and posterior colporrhaphy, enterocele repair, and vaginal vault suspension.  The risks and benefits have been discussed with the patient and she accepts these procedures.  LABORATORY DATA:  Admission hemoglobin 13.2, hematocrit 40.5.  EKG:  Normal sinus rhythm.  Abnormal ECG, cannot rule out inferior infarct, age undetermined.  HOSPITAL COURSE:  The patient underwent vaginal hysterectomy, bilateral salpingo-oophorectomy, anterior colporrhaphy, rectocele and enterocele repairs, and colposuspension under general anesthesia.  The procedures were completed without any complications and the patient was returned to the recovery room in excellent condition.  Pathology report unremarkable cervix, weakly proliferative endometrium and foci of endometrial fibrosis and foreign body giant cell reaction.  No hyperplasia or malignancy identified.  Focal adenomyosis, unremarkable right and left ovaries and tubes.  Her postoperative course was without complications and she was discharged on the second postoperative day in excellent condition.  Final discharge instructions included no heavy lifting or straining, no vaginal entrance, increase ambulation as tolerated.  She is to call for any fever of over 100.5 or failure of daily improvement.  Diet regular.  MEDICATIONS: 1. Vioxx one p.o. q.d. 2. Tylox one p.o. q.4h. p.r.n. discomfort. 3. Stool softeners, two glycerin suppositories, Milk of Magnesia, or Fleet as  needed.  FOLLOW-UP:  She is to return to the office in one week for follow-up.  CONDITION ON  DISCHARGE:  Excellent.  FINAL DISCHARGE DIAGNOSES: 1. Severe pelvic organ prolapse with cystocele, rectocele, enterocele, grade    3. 2. Uterine descensus. 3. Abnormal uterine bleeding.  PROCEDURES PERFORMED:  Vaginal hysterectomy, bilateral salpingo-oophorectomy, anterior colporrhaphy, rectocele and enterocele repairs, and colposuspension under general anesthesia. DD:  09/13/98 TD:  09/12/00 Job: [id number]

## 2010-10-07 NOTE — Op Note (Signed)
[place]  Patient:    Diane Ingram, HUSH Visit Number: [id number] MRN: [id number]          Service Type: SUR Location: 4700 4708 01 Attending Physician:  Janalyn Rouse Dictated by:   Rose Phi. Maple Hudson, M.D. Proc. 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  OPERATION: 1. Right total mastectomy with right axillary sentinel lymph node biopsy. 2. Left total mastectomy.  SURGEON:  Rose Phi. Maple Hudson, M.D.  ASSISTANT:  Currie Paris, M.D.  ANESTHESIA:  General.  OPERATIVE PROCEDURE:  Prior to coming to the operating room, the patient had 1 millicurie of technetium sulfur colloid injected intradermally on the right breast, and after general anesthesia was induced, 5 cc of ______ blue was injected in the subareolar tissue and the breast massaged for about 3 minutes.  After the suitable general anesthesia was induced, the patient was placed in the supine position with both arms extended on the arm board.  After injecting the blue dye, we then prepped and draped both breasts and axillae out.  I approached the sentinel node biopsy on the right side first with a short transverse incision in the right axilla with dissection through the subcutaneous tissue to the clavipectoral fascia.  Just under the fascia, one could clearly identify both hot and blue lymph nodes with lymphatics going into them.  We clicked the lymphatics and removed the sentinel lymph node. There were no other palpable nodes or hot or blue nodes.  While that was being evaluated, the transverse elliptical incisions to the right breast were then outlined and incisions made and the flaps dissected in a standard fashion using a cautery, going to near the clavicle superiorly  and medially to near the sternum and inferiorly to the rectus fascia and laterally to the latissimus dorsi muscle.  We then removed the breast from medial to lateral, preserving the pectoralis fascia.  Following removal of the breast, we oriented the specimen for the pathologist.  The sentinel node turned out to be negative.  We then did a left total mastectomy in a similar fashion to the right side with transverse elliptical incisions and then dissection of the flaps and then removal of the breast in the standard way.  The sentinel node was not done on the left side as this procedure was prophylactic on the left.  With good hemostasis, we thoroughly irrigated the field with saline.  A 19-French Blake drain was inserted on either side and brought out through a stab wound. We then closed the incisions with staples.  Dressings were then applied.  The patient was transferred to the recovery room in satisfactory condition, having tolerated the procedure well. Dictated by:   Rose Phi. Maple Hudson, M.D. Attending Physician:  Janalyn Rouse DD:  09/18/01 TD:  09/18/01 Job: [id number]

## 2011-03-21 ENCOUNTER — Telehealth: Payer: Self-pay | Admitting: Internal Medicine

## 2011-03-21 NOTE — Telephone Encounter (Signed)
Pt would like to sch ct abd/pelvis and ct chest. Pt stated she has this done every yr due to hx breast cancer.

## 2011-03-22 ENCOUNTER — Other Ambulatory Visit: Payer: Self-pay | Admitting: *Deleted

## 2011-03-22 DIAGNOSIS — Z853 Personal history of malignant neoplasm of breast: Secondary | ICD-10-CM

## 2011-03-22 NOTE — Telephone Encounter (Signed)
Ok per dr Lovell Sheehan- done first of December- can schedule for dec

## 2011-03-22 NOTE — Telephone Encounter (Signed)
Referral sent to terri 

## 2011-04-21 ENCOUNTER — Other Ambulatory Visit (INDEPENDENT_AMBULATORY_CARE_PROVIDER_SITE_OTHER): Payer: Medicare Other

## 2011-04-21 DIAGNOSIS — R300 Dysuria: Secondary | ICD-10-CM

## 2011-04-21 DIAGNOSIS — R9389 Abnormal findings on diagnostic imaging of other specified body structures: Secondary | ICD-10-CM

## 2011-04-21 LAB — POCT URINALYSIS DIPSTICK
Bilirubin, UA: NEGATIVE
Ketones, UA: NEGATIVE
Nitrite, UA: NEGATIVE
Protein, UA: NEGATIVE
pH, UA: 7.5

## 2011-04-21 LAB — CREATININE, SERUM: Creatinine, Ser: 0.7 mg/dL (ref 0.4–1.2)

## 2011-04-24 ENCOUNTER — Other Ambulatory Visit: Payer: Medicare Other

## 2011-05-01 ENCOUNTER — Ambulatory Visit (INDEPENDENT_AMBULATORY_CARE_PROVIDER_SITE_OTHER)
Admission: RE | Admit: 2011-05-01 | Discharge: 2011-05-01 | Disposition: A | Discharge status: Home / Self Care | Payer: Medicare Other | Source: Ambulatory Visit | Attending: Internal Medicine | Admitting: Internal Medicine

## 2011-05-01 DIAGNOSIS — Z853 Personal history of malignant neoplasm of breast: Secondary | ICD-10-CM

## 2011-05-01 IMAGING — CT CT CHEST W/ CM
2 of 5 series · 15 of 36 positions shown, 18 images · IV contrast (agent unspecified)
Comparison: 04/27/2010.

CT CHEST

CLINICAL DATA: Breast cancer 1448.  Bilateral mastectomy.  No new
problems.

CT CHEST, ABDOMEN AND PELVIS WITH CONTRAST
TECHNIQUE: Contiguous axial images of the chest abdomen and pelvis
were obtained after IV contrast administration.
Contrast: 100  ml Tmnipaque-8PP

[Series 2: cap with · axial · 0.71mm/px · z∈[-562,-42]mm · 12 of 120 slices shown, 15 images]
[im 8/120  mediastinal]
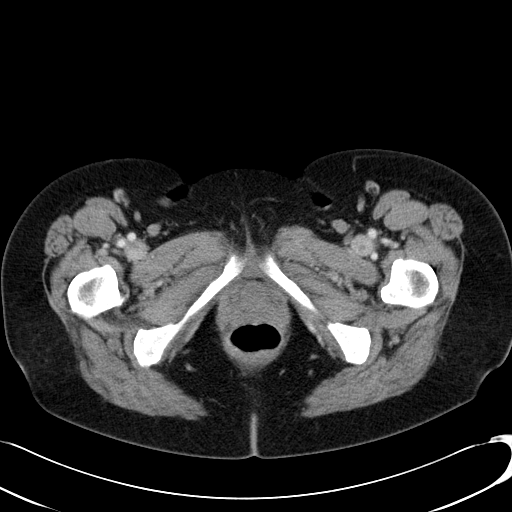
[im 8/120  lung]
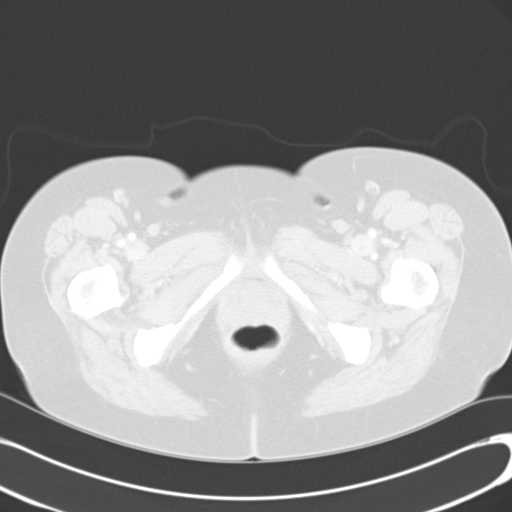
[im 15/120  lung]
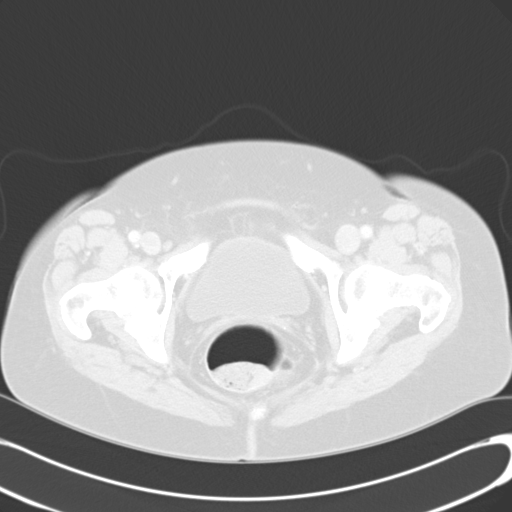
[im 30/120  lung]
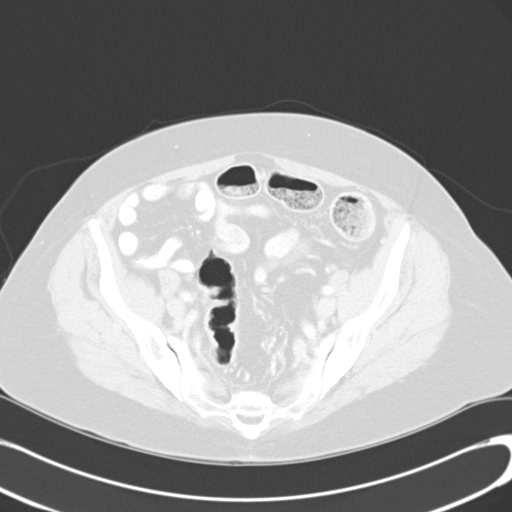
[im 38/120  lung]
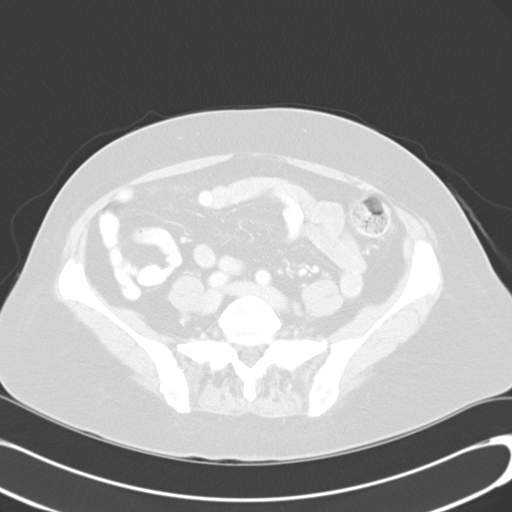
[im 45/120  mediastinal]
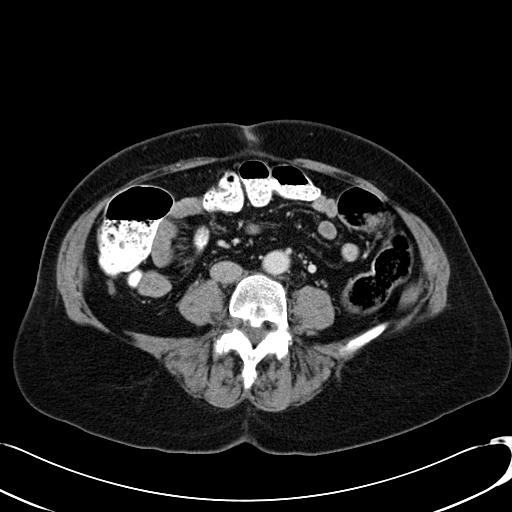
[im 45/120  lung]
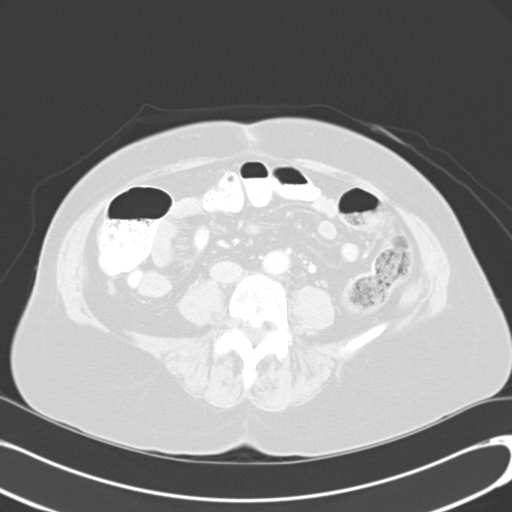
[im 53/120  lung]
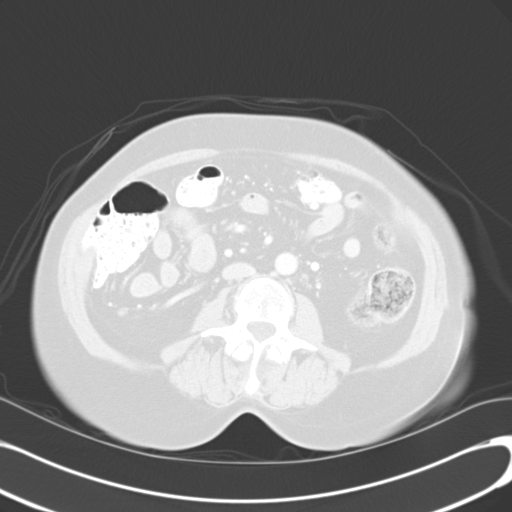
[im 67/120  lung]
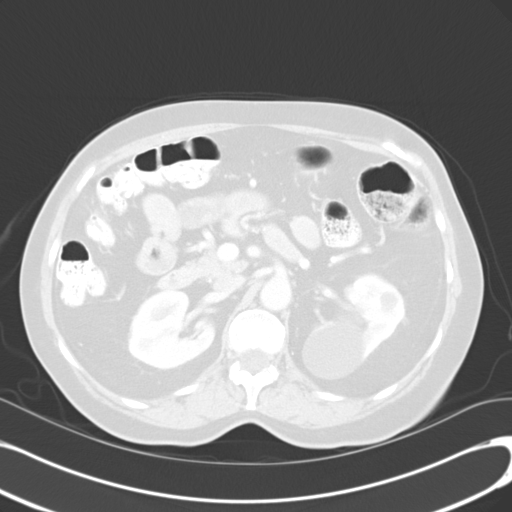
[im 75/120  lung]
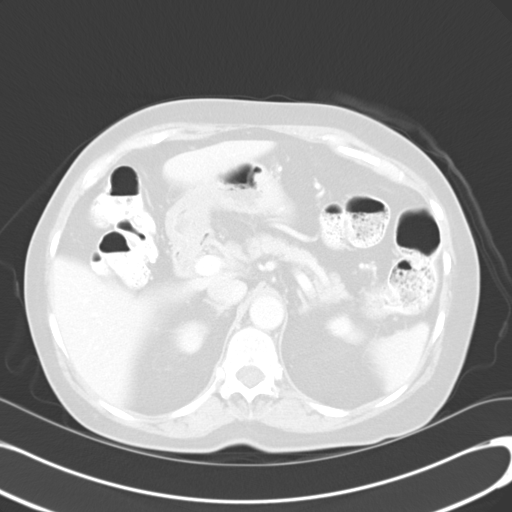
[im 82/120  mediastinal]
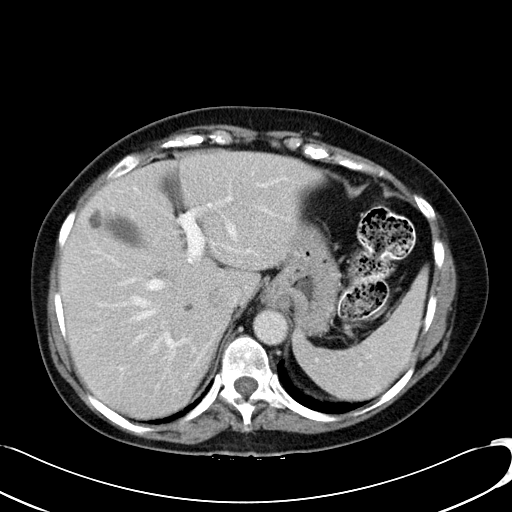
[im 82/120  lung]
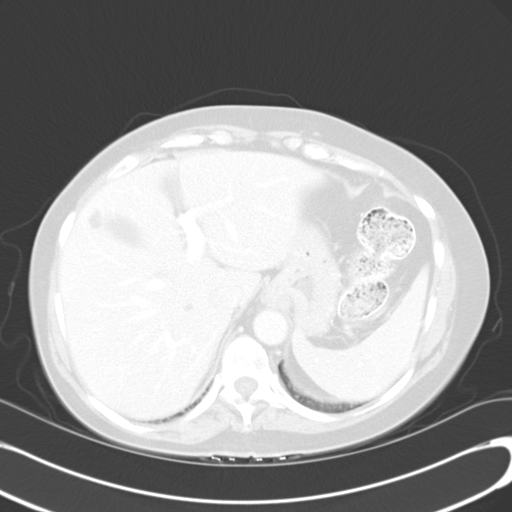
[im 90/120  lung]
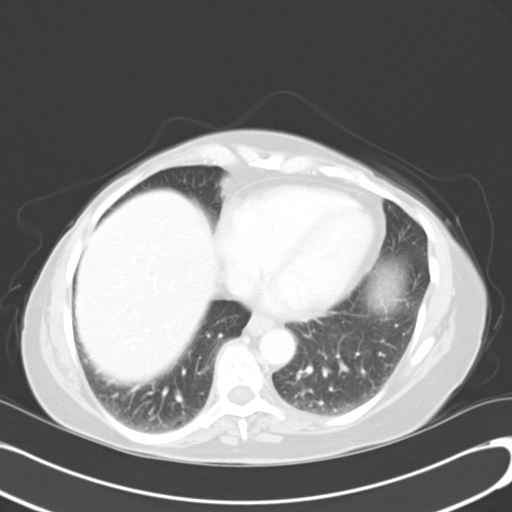
[im 105/120  lung]
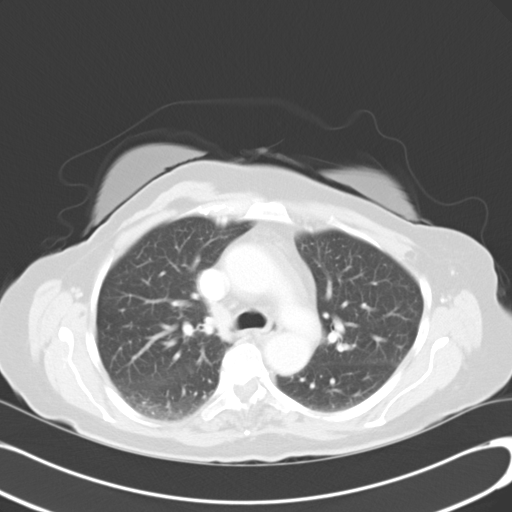
[im 112/120  lung]
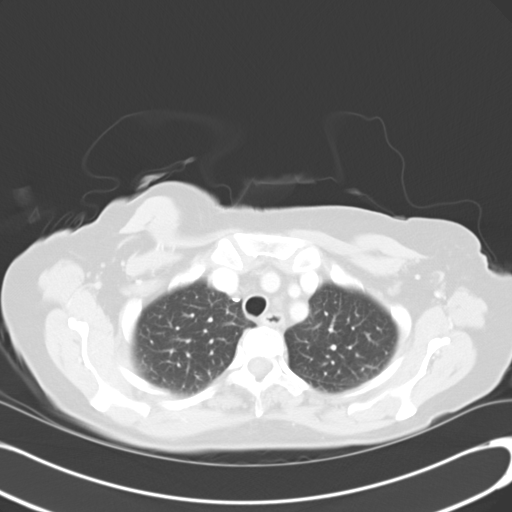

[Series 602: cor · coronal · 1.21mm/px · 3 of 115 slices shown]
[im 23/115  lung]
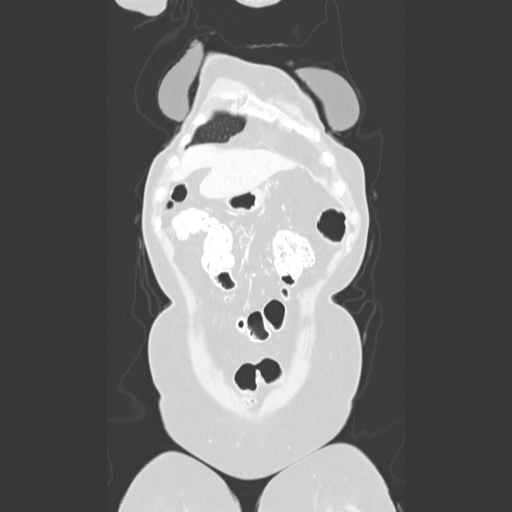
[im 46/115  lung]
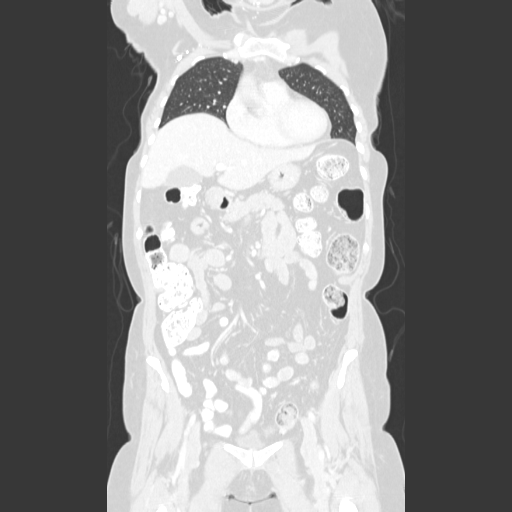
[im 69/115  lung]
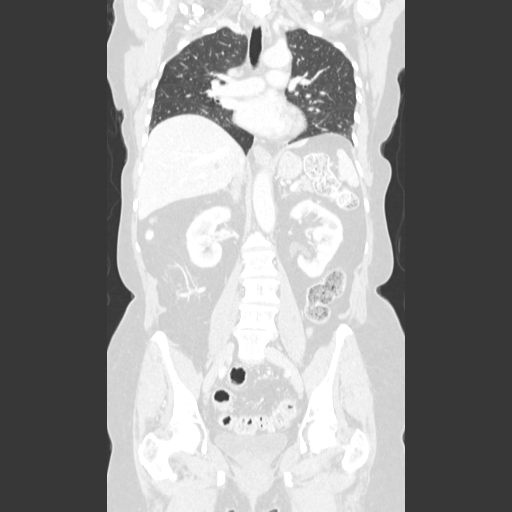

[15 of 36 positions shown; findings below may reference images not displayed]

FINDINGS: Lung windows demonstrate 2 mm subpleural right lower lobe
nodule on image 28, unchanged.  Stable 4 mm right lower lobe lung
nodule on image 23.
No new or enlarging nodules.
4-mm left lower lobe nodule on image 29 is unchanged.

Soft tissue windows demonstrate tiny bilateral thyroid low density
lesions are nonspecific.  The right-sided nodule was present on the
prior.  Status post bilateral mastectomy.  No evidence of axillary
adenopathy.  Borderline cardiomegaly, without pericardial or
pleural effusion. No mediastinal or hilar adenopathy. No central
pulmonary embolism, on this non-dedicated study.  Tiny hiatal
hernia.  No internal mammary adenopathy.
IMPRESSION: 1. No acute process or evidence of metastatic disease in the chest.
2.  Stable benign tiny lung nodules.

CT ABDOMEN AND PELVIS
FINDINGS: Central right hepatic lobe 1.2 cm hypoattenuating lesion
is unchanged on image 34.  Other tiny lesions are fluid density,
well circumscribed, and unchanged, likely small cysts.

Normal spleen.  The proximal stomach is underdistended.  Apparent
wall thickening is secondary.  Normal pancreas, gallbladder,
biliary tract, adrenal glands.

Interpolar left renal cysts.  The largest measures 5.2 cm.  Normal
right kidney.  Small retroperitoneal nodes are unchanged.  No
retrocrural adenopathy.  Extensive colonic diverticulosis. Normal
terminal ileum and appendix.  Normal small bowel without abdominal
ascites.

  No pelvic adenopathy.    Normal urinary bladder.  Hysterectomy.
No adnexal mass.  No acute osseous abnormality.  Prominent disc
bulge at L2-L3 and L3-L4.
IMPRESSION: 1. No acute process or evidence of metastatic disease in the
abdomen or pelvis.
2.  Similar appearance of benign hepatic cysts and a probable
hepatic hemangioma.
3.  Prominent lumbar spondylosis.

## 2011-05-01 MED ORDER — IOHEXOL 300 MG/ML  SOLN
100.0000 mL | Freq: Once | INTRAMUSCULAR | Status: AC | PRN
  Administered 2011-05-01: 100 mL via INTRAVENOUS

## 2011-05-02 ENCOUNTER — Telehealth: Payer: Self-pay

## 2011-05-02 NOTE — Telephone Encounter (Signed)
Pt states she had a CT done and would like her results.  Pls advise.

## 2011-05-02 NOTE — Telephone Encounter (Signed)
PT INFORMED -STABLE

## 2011-06-19 ENCOUNTER — Other Ambulatory Visit: Payer: Self-pay | Admitting: Internal Medicine

## 2011-10-10 ENCOUNTER — Encounter (INDEPENDENT_AMBULATORY_CARE_PROVIDER_SITE_OTHER): Payer: Self-pay | Admitting: Surgery

## 2011-10-10 ENCOUNTER — Ambulatory Visit (INDEPENDENT_AMBULATORY_CARE_PROVIDER_SITE_OTHER): Payer: Medicare Other | Admitting: Surgery

## 2011-10-10 ENCOUNTER — Encounter (INDEPENDENT_AMBULATORY_CARE_PROVIDER_SITE_OTHER): Payer: Self-pay | Admitting: General Surgery

## 2011-10-10 VITALS — BP 108/62 | HR 72 | Temp 97.3°F | Resp 12 | Ht 65.5 in | Wt 150.2 lb

## 2011-10-10 DIAGNOSIS — Z853 Personal history of malignant neoplasm of breast: Secondary | ICD-10-CM

## 2011-10-10 NOTE — Progress Notes (Signed)
NAME: Diane Ingram       DOB: [birth year]           DATE: 10/10/2011       MRN: [id number]   Diane Ingram is a 66 y.o.Marland Kitchenfemale who presents for routine followup of her Right breast cancer diagnosed in 2003 and treated with mastectomy and seShe has no problems or concerns on either side. Sentinel node plus left prophylactic mastectomy PFSH:She has had no significant changes since the last visit here.   ROS: There have been no significant changes since the last visit here  EXAM: General: The patient is alert, oriented, generally healty appearing, NAD. Mood and affect are normal.  Breasts: Post mastectomy no evidence of recurrence Lymphatics: She has no axillary or supraclavicular adenopathy on either side.  Extremities: Full ROM of the surgical side with no lymphedema noted.  Data Reviewed: No new data Impression: Doing well, with no evidence of recurrent cancer or new cancer  Plan: Will continue to follow up on an annual basis here. Told her she was now ten years and could well be followed by Dr Lovell Sheehan

## 2011-10-30 ENCOUNTER — Other Ambulatory Visit: Payer: Self-pay | Admitting: Internal Medicine

## 2012-04-23 ENCOUNTER — Other Ambulatory Visit: Payer: Self-pay | Admitting: Internal Medicine

## 2012-04-23 ENCOUNTER — Telehealth: Payer: Self-pay | Admitting: Internal Medicine

## 2012-04-23 ENCOUNTER — Other Ambulatory Visit: Payer: Self-pay | Admitting: *Deleted

## 2012-04-23 DIAGNOSIS — C50919 Malignant neoplasm of unspecified site of unspecified female breast: Secondary | ICD-10-CM

## 2012-04-23 DIAGNOSIS — R918 Other nonspecific abnormal finding of lung field: Secondary | ICD-10-CM

## 2012-04-23 DIAGNOSIS — K7689 Other specified diseases of liver: Secondary | ICD-10-CM

## 2012-04-23 NOTE — Telephone Encounter (Signed)
Pt called and said that Dr Lovell Sheehan normally orders CT Scans for pt re: cysts. Pt is req to get CT ordered for this year at [facility] on [address].

## 2012-04-24 ENCOUNTER — Other Ambulatory Visit (INDEPENDENT_AMBULATORY_CARE_PROVIDER_SITE_OTHER): Payer: Medicare Other

## 2012-04-24 ENCOUNTER — Ambulatory Visit (INDEPENDENT_AMBULATORY_CARE_PROVIDER_SITE_OTHER): Payer: Medicare Other | Admitting: Family

## 2012-04-24 ENCOUNTER — Encounter: Payer: Self-pay | Admitting: Family

## 2012-04-24 VITALS — BP 102/60 | Temp 98.1°F | Wt 156.0 lb

## 2012-04-24 DIAGNOSIS — Z853 Personal history of malignant neoplasm of breast: Secondary | ICD-10-CM

## 2012-04-24 DIAGNOSIS — R350 Frequency of micturition: Secondary | ICD-10-CM

## 2012-04-24 DIAGNOSIS — M810 Age-related osteoporosis without current pathological fracture: Secondary | ICD-10-CM

## 2012-04-24 DIAGNOSIS — Z2911 Encounter for prophylactic immunotherapy for respiratory syncytial virus (RSV): Secondary | ICD-10-CM

## 2012-04-24 DIAGNOSIS — Z23 Encounter for immunization: Secondary | ICD-10-CM

## 2012-04-24 LAB — POCT URINALYSIS DIPSTICK
Glucose, UA: NEGATIVE
Leukocytes, UA: NEGATIVE
Nitrite, UA: NEGATIVE
Protein, UA: NEGATIVE
Spec Grav, UA: 1.01
Urobilinogen, UA: 0.2

## 2012-04-24 LAB — BASIC METABOLIC PANEL
CO2: 26 mEq/L (ref 19–32)
Calcium: 8.6 mg/dL (ref 8.4–10.5)
Creatinine, Ser: 0.7 mg/dL (ref 0.4–1.2)
GFR: 83.23 mL/min (ref >60.00)
Sodium: 141 mEq/L (ref 135–145)

## 2012-04-24 MED ORDER — RALOXIFENE HCL 60 MG PO TABS: 60.0000 mg | ORAL_TABLET | Freq: Every day | ORAL | Status: DC

## 2012-04-24 NOTE — Progress Notes (Signed)
  Subjective:    Patient ID: Diane Ingram, female    DOB: [birth year], 66 y.o.   MRN: [id number]  HPI 66 year old white female, nonsmoker, patient of Dr. Lovell Sheehan is in today with complaints of urinary frequency x1 week. Denies any odor, back pain, abdominal pain, fever, muscle aches. She has not taken anything for relief. She's also requesting a shingles vaccine today. And a refill on her Evista. She's tolerating medication well.   Review of Systems  Constitutional: Negative.   HENT: Negative.   Respiratory: Negative.   Cardiovascular: Negative.   Gastrointestinal: Negative.   Genitourinary: Positive for frequency. Negative for vaginal bleeding and vaginal discharge.  Musculoskeletal: Negative.   Skin: Negative.   Hematological: Negative.   Psychiatric/Behavioral: Negative.    Past Medical History  Diagnosis Date  . Cancer April 2003    right Breast  . Hyperlipidemia     History   Social History  . Marital Status: Divorced    Spouse Name: N/A    Number of Children: N/A  . Years of Education: N/A   Occupational History  . Not on file.   Social History Main Topics  . Smoking status: Never Smoker   . Smokeless tobacco: Not on file  . Alcohol Use: No  . Drug Use: No  . Sexually Active:    Other Topics Concern  . Not on file   Social History Narrative  . No narrative on file    Past Surgical History  Procedure Date  . Abdominal hysterectomy 2002  . Mastectomy 09/18/01    bilateral    Family History  Problem Relation Age of Onset  . Cancer Mother     breast  . Cancer Father     melanoma    No Known Allergies  Current Outpatient Prescriptions on File Prior to Visit  Medication Sig Dispense Refill  . pravastatin (PRAVACHOL) 40 MG tablet TAKE 1 TABLET IN THE EVENING  30 tablet  8    BP 102/60  Temp 98.1 F (36.7 C) (Oral)  Wt 156 lb (70.761 kg)chart    Objective:   Physical Exam  Constitutional: She is oriented to person, place, and time. She  appears well-developed and well-nourished.  HENT:  Right Ear: External ear normal.  Left Ear: External ear normal.  Nose: Nose normal.  Mouth/Throat: Oropharynx is clear and moist.  Neck: Normal range of motion. Neck supple. No thyromegaly present.  Cardiovascular: Normal rate, regular rhythm and normal heart sounds.   Pulmonary/Chest: Effort normal and breath sounds normal.  Abdominal: Soft. Bowel sounds are normal.  Musculoskeletal: Normal range of motion.  Neurological: She is alert and oriented to person, place, and time.  Skin: Skin is warm and dry.  Psychiatric: She has a normal mood and affect.          Assessment & Plan:  Assessment: Urinary Frequency, Need for Shingles Vaccine, osteoporosis  Plan: Urine culture sent. Will notify patient pending results. Call the office if symptoms worsen or persist. Recheck as scheduled and sooner as needed. Continue current medications. Recheck with PCP as scheduled and sooner when necessary.

## 2012-04-26 ENCOUNTER — Other Ambulatory Visit: Payer: Medicare Other

## 2012-04-26 ENCOUNTER — Ambulatory Visit (INDEPENDENT_AMBULATORY_CARE_PROVIDER_SITE_OTHER)
Admission: RE | Admit: 2012-04-26 | Discharge: 2012-04-26 | Disposition: A | Discharge status: Home / Self Care | Payer: Medicare Other | Source: Ambulatory Visit | Attending: Internal Medicine | Admitting: Internal Medicine

## 2012-04-26 ENCOUNTER — Ambulatory Visit
Admission: RE | Admit: 2012-04-26 | Discharge: 2012-04-26 | Disposition: A | Discharge status: Home / Self Care | Payer: Medicare Other | Source: Ambulatory Visit | Attending: Internal Medicine | Admitting: Internal Medicine

## 2012-04-26 DIAGNOSIS — C50919 Malignant neoplasm of unspecified site of unspecified female breast: Secondary | ICD-10-CM

## 2012-04-26 DIAGNOSIS — R918 Other nonspecific abnormal finding of lung field: Secondary | ICD-10-CM

## 2012-04-26 IMAGING — CT CT CHEST W/ CM
2 of 5 series · 15 of 36 positions shown, 18 images · IV contrast (omnipaque)
Comparison: CT 05/01/2011.

CT CHEST

CLINICAL DATA: Breast cancer.  Follow-up pulmonary nodules.

CT CHEST, ABDOMEN AND PELVIS WITH CONTRAST
TECHNIQUE: Multidetector CT imaging of the chest, abdomen and
pelvis was performed following the standard protocol during bolus
administration of intravenous contrast.
Contrast: 100mL OMNIPAQUE IOHEXOL 300 MG/ML  SOLN

[Series 2: cap with · axial · 0.72mm/px · z∈[-582,-62]mm · 12 of 120 slices shown, 15 images]
[im 8/120  mediastinal]
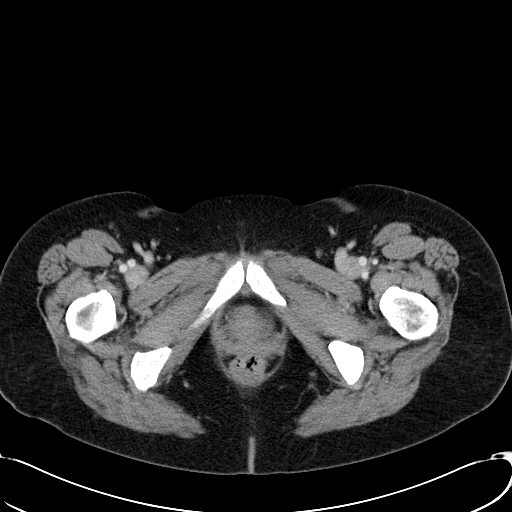
[im 8/120  lung]
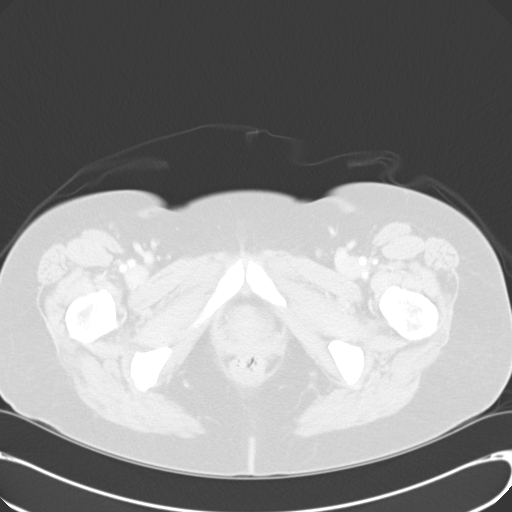
[im 15/120  lung]
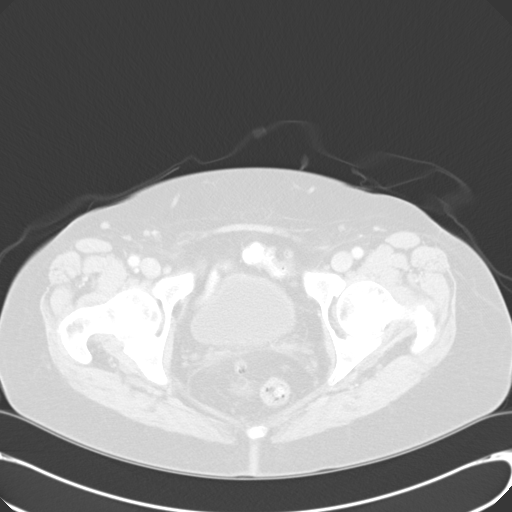
[im 30/120  lung]
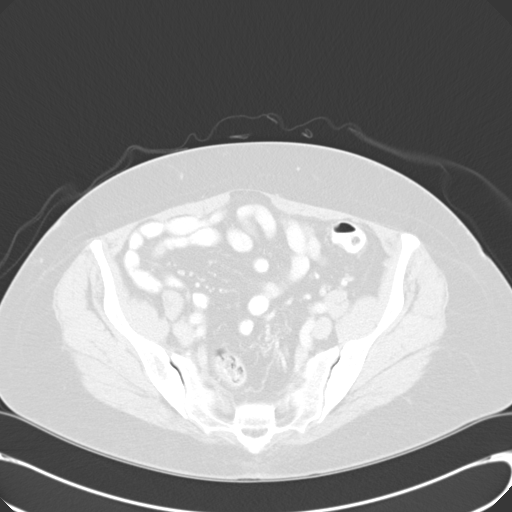
[im 38/120  lung]
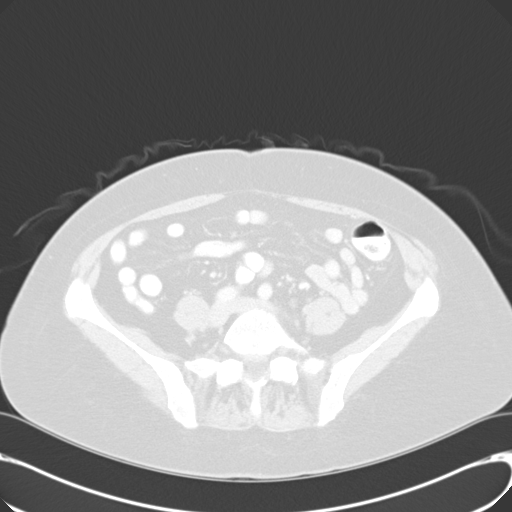
[im 45/120  mediastinal]
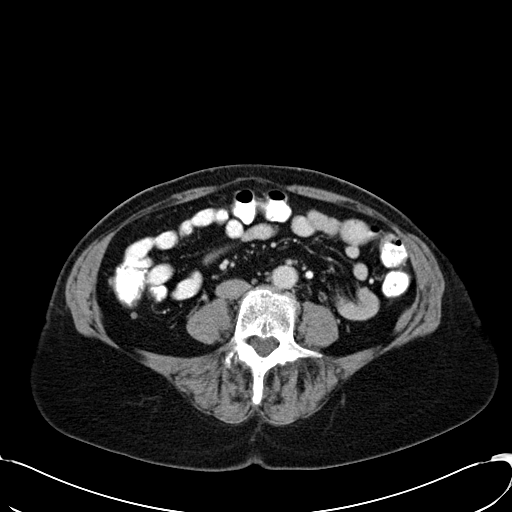
[im 45/120  lung]
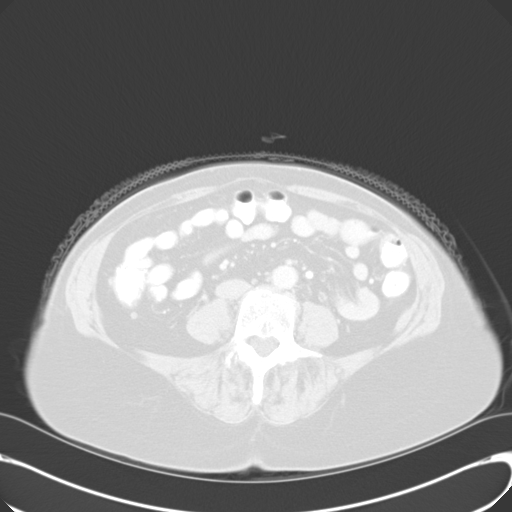
[im 53/120  lung]
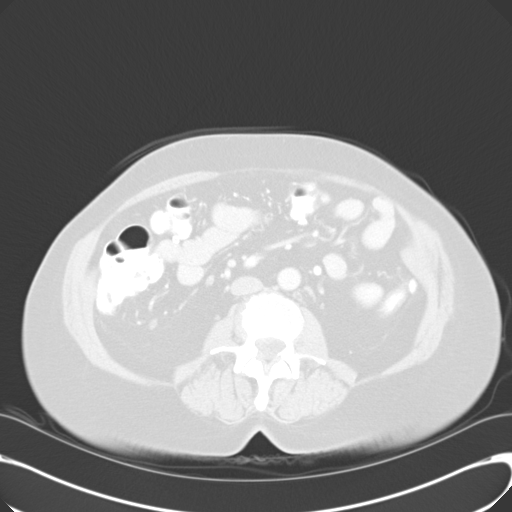
[im 67/120  lung]
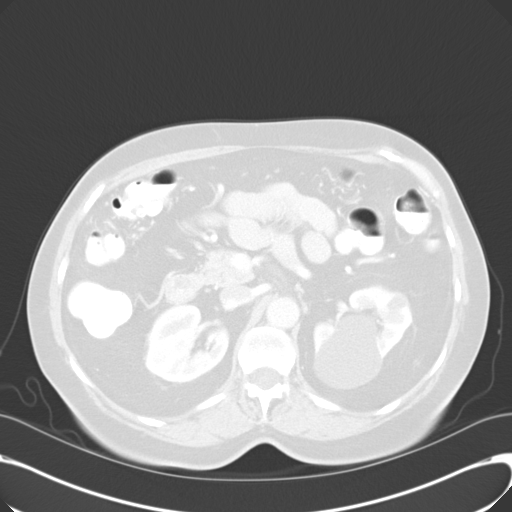
[im 75/120  lung]
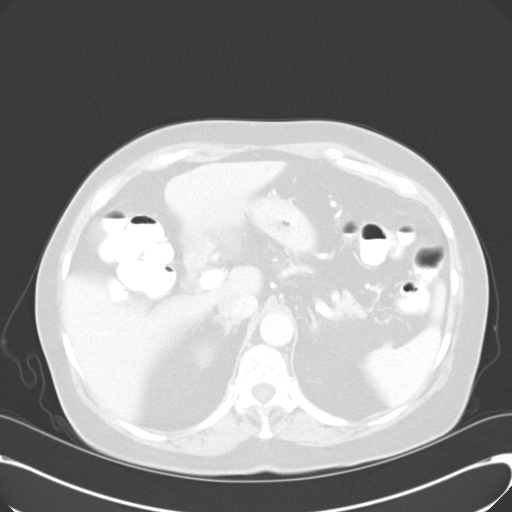
[im 82/120  mediastinal]
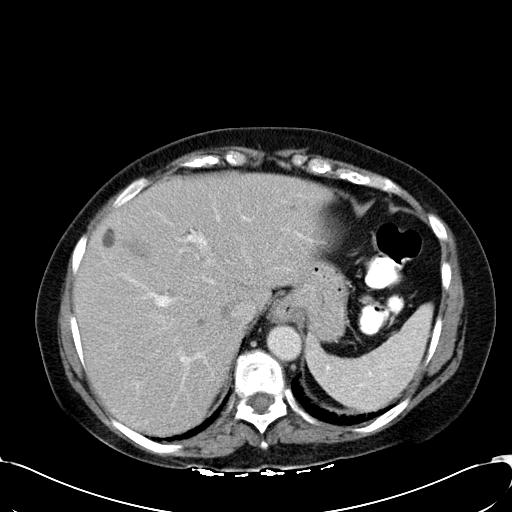
[im 82/120  lung]
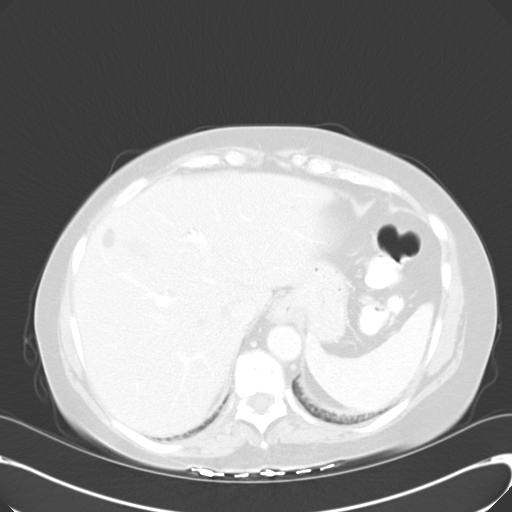
[im 90/120  lung]
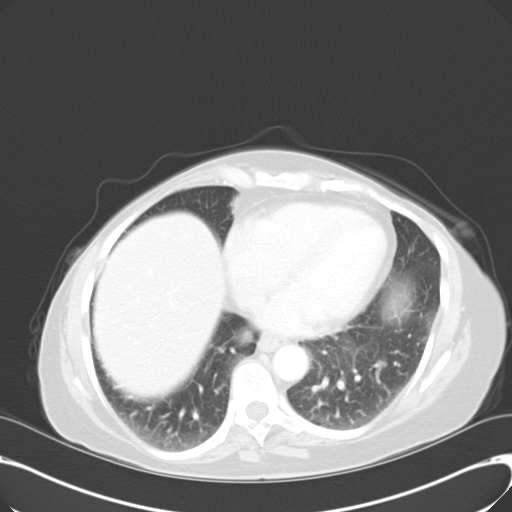
[im 105/120  lung]
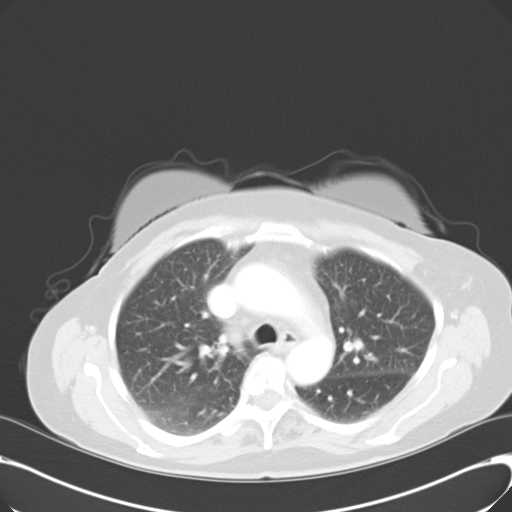
[im 112/120  lung]
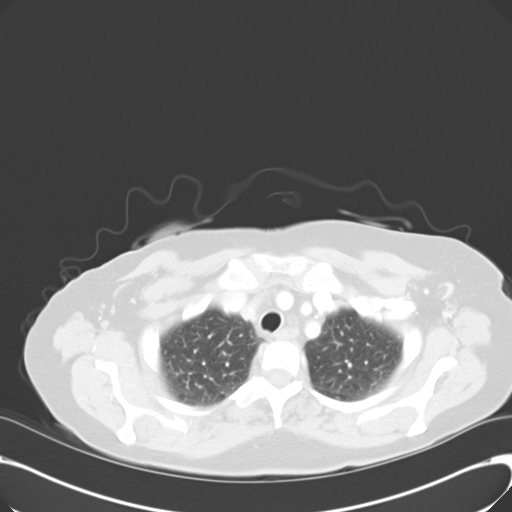

[Series 602: cor · coronal · 1.21mm/px · 3 of 116 slices shown]
[im 24/116  lung]
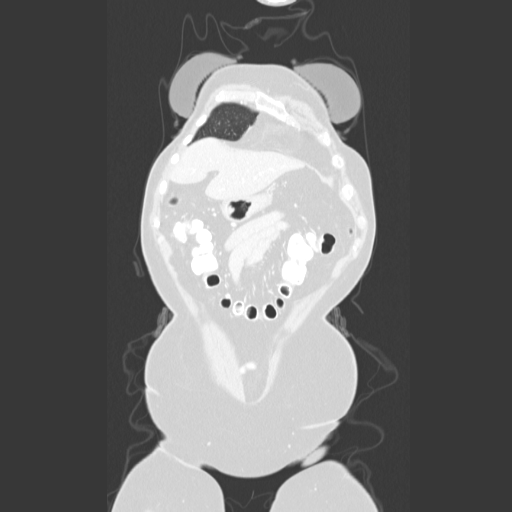
[im 47/116  lung]
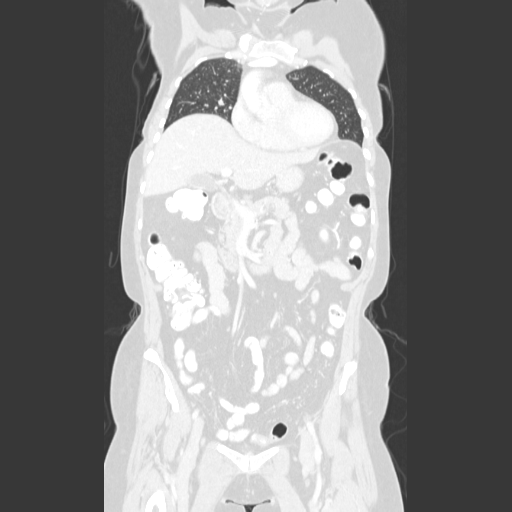
[im 70/116  lung]
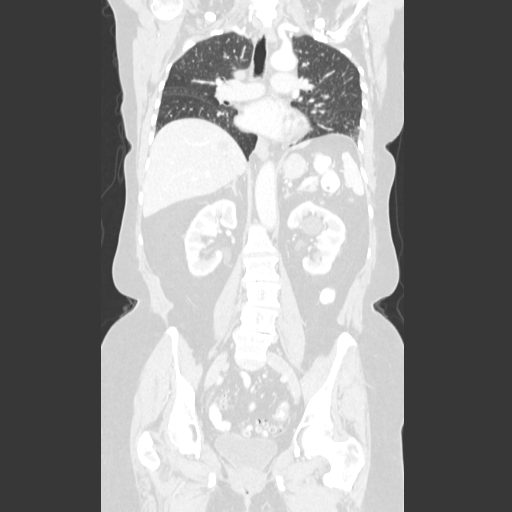

[15 of 36 positions shown; findings below may reference images not displayed]

FINDINGS: The chest wall is unremarkable and stable.  Status post
bilateral mastectomies.  No chest wall masses, supraclavicular or
axillary lymphadenopathy.  Small stable left axillary lymph nodes
are noted.  The bony thorax is intact.  No destructive bone lesions
or spinal canal compromise.  There are a few small scattered stable
sclerotic bone lesions.  A small mid thoracic disc protrusion is
noted with a nuclear trail sign.

The heart is normal in size.  No pericardial effusion.  No
mediastinal or hilar lymphadenopathy.  The aorta is normal in
caliber.  Mild tortuosity and ectasia.  No dissection.  The
esophagus is grossly normal.

Examination of the lung parenchyma demonstrates stable tiny
scattered pulmonary nodules.  No new or enlarging nodules.  No
acute pulmonary findings.  No pleural effusion.
IMPRESSION: 1.  Stable CT appearance of the chest.  No findings for  chest wall
tumor or axillary adenopathy.
2.  Stable small pulmonary nodules.
3.  Stable scattered sclerotic bone lesions and small thoracic disc
protrusions.

CT ABDOMEN AND PELVIS
FINDINGS: Stable small hepatic cysts and right hepatic lobe
hemangioma.  No worrisome hepatic lesions or intrahepatic biliary
dilatation.  The common bile duct is normal in caliber.  The
pancreas is normal.  The spleen is normal.  The adrenal glands and
kidneys are stable.  A large left renal cyst is unchanged.  The
ureters are normal.

The stomach is not distended with contrast but no gross
abnormalities are seen.  The duodenum, small bowel and colon are
unremarkable.  No inflammatory changes or mass lesions.  The
appendix is normal.  No mesenteric or retroperitoneal mass or
adenopathy.  The aorta is normal in caliber.  The major branch
vessels are normal.  There are small scattered stable
retroperitoneal lymph nodes.

The uterus is surgically absent.  No pelvic mass, adenopathy or
free pelvic fluid collections.  The bladder is normal.  Moderate
diverticulosis of the sigmoid colon is noted.

The bony structures are intact.  No destructive bone lesions.
IMPRESSION: Unremarkable and stable CT appearance of the abdomen/pelvis.  No CT
findings for metastatic disease.

## 2012-04-26 MED ORDER — IOHEXOL 300 MG/ML  SOLN
100.0000 mL | Freq: Once | INTRAMUSCULAR | Status: AC | PRN
  Administered 2012-04-26: 100 mL via INTRAVENOUS

## 2012-04-30 ENCOUNTER — Telehealth: Payer: Self-pay | Admitting: Internal Medicine

## 2012-04-30 NOTE — Telephone Encounter (Signed)
Pt would like to know results of CAT scan done 04/26/12. Pls call on cell phone:  [phone]

## 2012-04-30 NOTE — Telephone Encounter (Signed)
Pt informed -all ct stable

## 2012-08-28 ENCOUNTER — Encounter: Payer: Self-pay | Admitting: Diagnostic Neuroimaging

## 2012-08-28 ENCOUNTER — Ambulatory Visit (INDEPENDENT_AMBULATORY_CARE_PROVIDER_SITE_OTHER): Payer: Medicare Other | Admitting: Diagnostic Neuroimaging

## 2012-08-28 VITALS — BP 114/70 | HR 75 | Temp 98.0°F | Ht 65.5 in | Wt 154.0 lb

## 2012-08-28 DIAGNOSIS — H5702 Anisocoria: Secondary | ICD-10-CM | POA: Insufficient documentation

## 2012-08-28 NOTE — Patient Instructions (Signed)
I will check MRI, MRA scans.

## 2012-08-28 NOTE — Progress Notes (Signed)
[facility]  PATIENT: Diane Ingram DOB: [birth year]  REFERRING CLINICIAN: Hollander HISTORY FROM: patient REASON FOR VISIT: new consult   HISTORICAL  CHIEF COMPLAINT:  Chief Complaint  Patient presents with  . Eye Problem    pupil enlarged  . Leg Swelling    near ankles    HISTORY OF PRESENT ILLNESS:   67 year old right-handed female here for evaluation of unequal pupils.  In March 2014 patient was going to a routine eye exam and as she left her office, a coworker noticed that the patient's right eye was significantly dilated. Patient went onto her optometry evaluation, who then referred her to ophthalmology the same day. Patient was evaluated and also seen the next day. 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  Since that time patient continues to have mildly unequal pupils but not as significant as that day in March.  REVIEW OF SYSTEMS: Full 14 system review of systems performed and notable only for swelling in legs and ankles. Otherwise negative  ALLERGIES: No Known Allergies  HOME MEDICATIONS: Outpatient Prescriptions Prior to Visit  Medication Sig Dispense Refill  . pravastatin (PRAVACHOL) 40 MG tablet TAKE 1 TABLET IN THE EVENING  30 tablet  8  . raloxifene (EVISTA) 60 MG tablet Take 1 tablet (60 mg total) by mouth daily.  30 tablet  9   No facility-administered medications prior to visit.    PAST MEDICAL HISTORY: Past Medical History  Diagnosis Date  . Cancer April 2003    right Breast  . Hyperlipidemia     PAST SURGICAL HISTORY: Past Surgical History  Procedure Laterality Date  . Abdominal hysterectomy  2002  . Mastectomy  09/18/01    bilateral    FAMILY HISTORY: Family History  Problem Relation Age of Onset  . Cancer Mother     breast  . Cancer Father     melanoma    SOCIAL HISTORY:  History   Social History  . Marital Status:  Divorced    Spouse Name: N/A    Number of Children: 2  . Years of Education: Busn. Sch.   Occupational History  . [employer]    Social History Main Topics  . Smoking status: Never Smoker   . Smokeless tobacco: Not on file  . Alcohol Use: No  . Drug Use: No  . Sexually Active:    Other Topics Concern  . Not on file   Social History Narrative   Pt lives at home alone.   She does use caffeine.     PHYSICAL EXAM  Filed Vitals:   08/28/12 0849  BP: 114/70  Pulse: 75  Temp: 98 F (36.7 C)  TempSrc: Oral  Height: 5' 5.5" (1.664 m)  Weight: 154 lb (69.854 kg)   Body mass index is 25.23 kg/(m^2).  GENERAL EXAM: Patient is in no distress  CARDIOVASCULAR: Regular rate and rhythm, no murmurs, no carotid bruits  NEUROLOGIC: MENTAL STATUS: awake, alert, language fluent, comprehension intact, naming intact CRANIAL NERVE: no papilledema on fundoscopic exam, PUPILS IN DARK ROOM (R 4-->3, L 3-->2), IN BRIGHT ROOM (R 3-->2, L 2-->1). REACTIVE TO DIRECT AND CONSENUAL LIGHT. NO PTOSIS, BUT LEFT EYE BROW SAGS LOWER, SIMILAR TO DRIVERS LICENSE PIC FROM 16/10/96. Visual fields full to confrontation, extraocular muscles intact, no nystagmus, facial sensation and strength symmetric, uvula midline, shoulder shrug symmetric, tongue midline. MOTOR: normal bulk and tone, full strength in the BUE, BLE SENSORY: normal and  symmetric to light touch, pinprick, temperature, vibration COORDINATION: finger-nose-finger, fine finger movements normal REFLEXES: deep tendon reflexes present; RIGHT KNEE 2, LEFT KNEE 1. ANKLES 1.  GAIT/STATION: narrow based gait; able to walk on toes, heels and tandem; romberg is negative   DIAGNOSTIC DATA (LABS, IMAGING, TESTING) - I reviewed patient records, labs, notes, testing and imaging myself where available.  Lab Results  Component Value Date   WBC 5.6 07/23/2008   HGB 13.5 07/23/2008   HCT 40.2 07/23/2008   MCV 88.9 07/23/2008   PLT 279 07/23/2008        Component Value Date/Time   NA 141 04/24/2012 0944   K 3.8 04/24/2012 0944   CL 108 04/24/2012 0944   CO2 26 04/24/2012 0944   GLUCOSE 103* 04/24/2012 0944   GLUCOSE 97 03/08/2006 1252   BUN 15 04/24/2012 0944   CREATININE 0.7 04/24/2012 0944   CALCIUM 8.6 04/24/2012 0944   PROT 5.7* 07/23/2008 0917   ALBUMIN 3.6 07/23/2008 0917   AST 10 07/23/2008 0917   ALT 9 07/23/2008 0917   ALKPHOS 59 07/23/2008 0917   BILITOT 0.8 07/23/2008 0917   GFRNONAA 86.44 04/07/2010 1339   GFRAA 130 07/23/2008 0917   Lab Results  Component Value Date   CHOL 155 07/23/2008   HDL 50.7 07/23/2008   LDLCALC 89 07/23/2008   LDLDIRECT 128.9 02/05/2008   TRIG 77 07/23/2008   CHOLHDL 3.1 CALC 07/23/2008   No results found for this basename: HGBA1C   No results found for this basename: VITAMINB12   Lab Results  Component Value Date   TSH 0.94 07/23/2008    ASSESSMENT AND PLAN  67 y.o. year old female  has a past medical history of Cancer (April 2003) and Hyperlipidemia. here with mild anisocoria, right pupil slightly larger than left. 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 She does have slight left ptosis which seem stable from her driver's license picture from 2011. 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   Orders Placed This Encounter  Procedures  . MR Brain W Wo Contrast  . MR MRA HEAD WO CONTRAST  . MR Angiogram Neck W Wo Contrast     Suanne Marker, MD 08/28/2012, 9:25 AM Certified in Neurology, Neurophysiology and Neuroimaging  [facility] [address], [address] [place], [place] [address] [phone]

## 2012-09-11 ENCOUNTER — Ambulatory Visit
Admission: RE | Admit: 2012-09-11 | Discharge: 2012-09-11 | Disposition: A | Discharge status: Home / Self Care | Payer: Medicare Other | Source: Ambulatory Visit | Attending: Diagnostic Neuroimaging | Admitting: Diagnostic Neuroimaging

## 2012-09-11 DIAGNOSIS — H5702 Anisocoria: Secondary | ICD-10-CM

## 2012-09-11 MED ORDER — GADOBENATE DIMEGLUMINE 529 MG/ML IV SOLN
14.0000 mL | Freq: Once | INTRAVENOUS | Status: AC | PRN
  Administered 2012-09-11: 14 mL via INTRAVENOUS

## 2012-09-12 ENCOUNTER — Telehealth: Payer: Self-pay | Admitting: *Deleted

## 2012-09-12 NOTE — Telephone Encounter (Signed)
Message copied by Monico Blitz on Thu Sep 12, 2012  3:31 PM ------      Message from: Warren Lacy A      Created: Thu Sep 12, 2012  3:08 PM       Artemis called and said she hasn't heard anything about the results of her MRI. She would like for someone to please call her at [phone] or [phone]            Thanks ------

## 2012-09-18 ENCOUNTER — Telehealth: Payer: Self-pay | Admitting: Diagnostic Neuroimaging

## 2012-09-18 NOTE — Telephone Encounter (Signed)
Pt calling for her test results.   MRI/MRA'.    This is another request for results.

## 2012-09-19 ENCOUNTER — Telehealth: Payer: Self-pay | Admitting: Diagnostic Neuroimaging

## 2012-09-19 NOTE — Telephone Encounter (Signed)
I called pt and on her cell and gave her the results of MRI, MRA's and noted per Dr. 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  Pt verbalized understanding.

## 2012-09-19 NOTE — Telephone Encounter (Signed)
Patient would like her recent MRI results

## 2012-10-18 ENCOUNTER — Ambulatory Visit (INDEPENDENT_AMBULATORY_CARE_PROVIDER_SITE_OTHER): Payer: Medicare Other | Admitting: Surgery

## 2012-11-08 ENCOUNTER — Encounter (INDEPENDENT_AMBULATORY_CARE_PROVIDER_SITE_OTHER): Payer: Self-pay | Admitting: Surgery

## 2012-11-08 ENCOUNTER — Ambulatory Visit (INDEPENDENT_AMBULATORY_CARE_PROVIDER_SITE_OTHER): Payer: Medicare Other | Admitting: Surgery

## 2012-11-08 VITALS — BP 118/68 | HR 66 | Temp 97.6°F | Resp 15

## 2012-11-08 DIAGNOSIS — Z853 Personal history of malignant neoplasm of breast: Secondary | ICD-10-CM

## 2012-11-08 NOTE — Patient Instructions (Signed)
We will see you again on an as needed basis. Please call the office at [phone] if you have any questions or concerns. Thank you for allowing us to take care of you.  

## 2012-11-08 NOTE — Progress Notes (Signed)
NAME: Diane Ingram       DOB: 06-29-1945           DATE: 11/08/2012       MRN: [id number]   Diane Ingram is a 67 y.o.Marland Kitchenfemale who presents for routine followup of her Stage 1 Right breast cancer, IDC, receptor-, Her2+ diagnosed in 2003 and treated with mastectomy, Sentinel node plus left prophylactic mastectomy She has no problems or concerns on either side.  PFSH:She has had no significant changes since the last visit here.   ROS: There have been no significant changes since the last visit here  EXAM: General: The patient is alert, oriented, generally healty appearing, NAD. Mood and affect are normal.  Breasts: Post mastectomy no evidence of recurrence Lymphatics: She has no axillary or supraclavicular adenopathy on either side.  Extremities: Full ROM of the surgical side with no lymphedema noted.  Data Reviewed: No new data Impression: Doing well, with no evidence of recurrent cancer or new cancer  Plan: Will see back PRN.

## 2012-11-15 ENCOUNTER — Other Ambulatory Visit: Payer: Self-pay | Admitting: Internal Medicine

## 2013-02-17 ENCOUNTER — Encounter (INDEPENDENT_AMBULATORY_CARE_PROVIDER_SITE_OTHER): Payer: Self-pay

## 2013-03-19 ENCOUNTER — Ambulatory Visit: Payer: Medicare Other | Admitting: Diagnostic Neuroimaging

## 2013-04-02 ENCOUNTER — Other Ambulatory Visit: Payer: Self-pay | Admitting: Family

## 2013-05-08 ENCOUNTER — Other Ambulatory Visit (INDEPENDENT_AMBULATORY_CARE_PROVIDER_SITE_OTHER): Payer: Self-pay | Admitting: *Deleted

## 2013-05-08 MED ORDER — UNABLE TO FIND: Status: DC

## 2013-06-05 ENCOUNTER — Encounter: Payer: Self-pay | Admitting: Diagnostic Neuroimaging

## 2013-06-05 ENCOUNTER — Ambulatory Visit (INDEPENDENT_AMBULATORY_CARE_PROVIDER_SITE_OTHER): Payer: Medicare Other | Admitting: Diagnostic Neuroimaging

## 2013-06-05 VITALS — BP 110/73 | HR 76 | Temp 97.8°F | Ht 65.0 in | Wt 156.0 lb

## 2013-06-05 DIAGNOSIS — H57059 Tonic pupil, unspecified eye: Secondary | ICD-10-CM | POA: Insufficient documentation

## 2013-06-05 NOTE — Progress Notes (Signed)
[facility]  PATIENT: Diane Ingram DOB: Nov 02, 1945  REFERRING CLINICIAN:  HISTORY FROM: patient REASON FOR VISIT: follow up    HISTORICAL  CHIEF COMPLAINT:  Chief Complaint  Patient presents with  . Follow-up    anisocoria    HISTORY OF PRESENT ILLNESS:   UPDATE 06/05/13: Since last visit, doing well. Pupils are back to normal. No visions changes, HA, N/V.   PRIOR HPI (08/28/12): 68 year old right-handed female here for evaluation of unequal pupils.  In March 2014 patient was going to a routine eye exam and as she left her office, a coworker noticed that the patient's right eye was significantly dilated. Patient went onto her optometry evaluation, who then referred her to ophthalmology the same day. Patient was evaluated and also seen the next day. 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 Since that time patient continues to have mildly unequal pupils but not as significant as that day in March.  REVIEW OF SYSTEMS: Full 14 system review of systems performed and notable only for nothing.  ALLERGIES: No Known Allergies  HOME MEDICATIONS: Outpatient Prescriptions Prior to Visit  Medication Sig Dispense Refill  . pravastatin (PRAVACHOL) 40 MG tablet TAKE 1 TABLET IN THE EVENING  30 tablet  7  . raloxifene (EVISTA) 60 MG tablet TAKE 1 TABLET (60 MG TOTAL) BY MOUTH DAILY.  30 tablet  9  . UNABLE TO FIND Rx: L8000- Post Surgical Bras (Quantity: 6) E5277- Non-Silicone Breast Prosthesis (Quantity: 1) Dx: 174.9; Right mastectomy  1 each  0   No facility-administered medications prior to visit.    PAST MEDICAL HISTORY: Past Medical History  Diagnosis Date  . Cancer April 2003    right Breast  . Hyperlipidemia     PAST SURGICAL HISTORY: Past Surgical History  Procedure Laterality Date  . Abdominal hysterectomy  2002  . Mastectomy  09/18/01    bilateral    FAMILY  HISTORY: Family History  Problem Relation Age of Onset  . Cancer Mother     breast  . Cancer Father     melanoma    SOCIAL HISTORY:  History   Social History  . Marital Status: Divorced    Spouse Name: N/A    Number of Children: 2  . Years of Education: Busn. Sch.   Occupational History  . [employer]    Social History Main Topics  . Smoking status: Never Smoker   . Smokeless tobacco: Never Used  . Alcohol Use: No  . Drug Use: No  . Sexual Activity: Not on file   Other Topics Concern  . Not on file   Social History Narrative   Pt lives at home alone.   She does use caffeine.     PHYSICAL EXAM  Filed Vitals:   06/05/13 1329  BP: 110/73  Pulse: 76  Temp: 97.8 F (36.6 C)  TempSrc: Oral  Height: 5\' 5"  (1.651 m)  Weight: 156 lb (70.761 kg)   Body mass index is 25.96 kg/(m^2).  GENERAL EXAM: Patient is in no distress  CARDIOVASCULAR: Regular rate and rhythm, no murmurs, no carotid bruits  NEUROLOGIC: MENTAL STATUS: awake, alert, language fluent, comprehension intact, naming intact CRANIAL NERVE: no papilledema on fundoscopic exam, PUPILS (R 2.5-->1.5, L 2-->1). REACTIVE TO DIRECT AND CONSENUAL LIGHT. NO PTOSIS, BUT LEFT EYE BROW SAGS LOWER, SIMILAR TO DRIVERS LICENSE PIC FROM 82/42/35. Visual fields full to confrontation, extraocular muscles intact, no nystagmus, facial  sensation and strength symmetric, uvula midline, shoulder shrug symmetric, tongue midline. MOTOR: normal bulk and tone, full strength in the BUE, BLE SENSORY: normal and symmetric to light touch, pinprick, temperature, vibration COORDINATION: finger-nose-finger, fine finger movements normal REFLEXES: deep tendon reflexes present 2+ throughout. GAIT/STATION: narrow based gait   DIAGNOSTIC DATA (LABS, IMAGING, TESTING) - I reviewed patient records, labs, notes, testing and imaging myself where available.  Lab Results  Component Value Date   WBC 5.6 07/23/2008   HGB 13.5 07/23/2008    HCT 40.2 07/23/2008   MCV 88.9 07/23/2008   PLT 279 07/23/2008      Component Value Date/Time   NA 141 04/24/2012 0944   K 3.8 04/24/2012 0944   CL 108 04/24/2012 0944   CO2 26 04/24/2012 0944   GLUCOSE 103* 04/24/2012 0944   GLUCOSE 97 03/08/2006 1252   BUN 15 04/24/2012 0944   CREATININE 0.7 04/24/2012 0944   CALCIUM 8.6 04/24/2012 0944   PROT 5.7* 07/23/2008 0917   ALBUMIN 3.6 07/23/2008 0917   AST 10 07/23/2008 0917   ALT 9 07/23/2008 0917   ALKPHOS 59 07/23/2008 0917   BILITOT 0.8 07/23/2008 0917   GFRNONAA 86.44 04/07/2010 1339   GFRAA 130 07/23/2008 0917   Lab Results  Component Value Date   CHOL 155 07/23/2008   HDL 50.7 07/23/2008   LDLCALC 89 07/23/2008   LDLDIRECT 128.9 02/05/2008   TRIG 77 07/23/2008   CHOLHDL 3.1 CALC 07/23/2008   No results found for this basename: HGBA1C   No results found for this basename: VITAMINB12   Lab Results  Component Value Date   TSH 0.94 07/23/2008    09/11/12 MRI brain (with and without) - few scattered foci of gliosis in the left frontal subcortical white matter. No acute findings.  09/11/12 MRA head (without) - normal  09/11/12 MRA neck (with and without)  1. Focal 0.9cm segment of signal void in the left vertebral artery (2cm from its origin) with normal distal flow. Likely represents focal area of artifact rather than high-grade stenosis or occlusion.  2. Bilateral internal carotid artery segment stenosis. Right vertebral artery has no stenosis.    ASSESSMENT AND PLAN  68 y.o. year old female  has a past medical history of Cancer (April 2003) and Hyperlipidemia. here with mild anisocoria, right pupil slightly larger than left. 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 Also has slight left ptosis which seem stable from her driver's license picture from 2011. Neuroimaging studies unremarkable.  Dx: Adie's pupil  PLAN: - follow up as needed  Return if symptoms worsen or fail to improve, for return to PCP.     Penni Bombard, MD 0/01/3817, 2:99 PM Certified in Neurology, Neurophysiology and Neuroimaging  [facility] [address], [place], [place] [address] [phone]

## 2013-06-26 ENCOUNTER — Other Ambulatory Visit: Payer: Self-pay | Admitting: Nurse Practitioner

## 2013-06-26 DIAGNOSIS — Z853 Personal history of malignant neoplasm of breast: Secondary | ICD-10-CM

## 2013-06-30 ENCOUNTER — Ambulatory Visit
Admission: RE | Admit: 2013-06-30 | Discharge: 2013-06-30 | Disposition: A | Discharge status: Home / Self Care | Payer: Medicare Other | Source: Ambulatory Visit | Attending: Nurse Practitioner | Admitting: Nurse Practitioner

## 2013-06-30 DIAGNOSIS — Z853 Personal history of malignant neoplasm of breast: Secondary | ICD-10-CM

## 2013-06-30 IMAGING — CT CT CHEST W/ CM
4 of 5 series · 11 of 46 positions shown, 18 images · IV contrast (READICAT/WATER & [ID] OMNI 300)
Comparison: Multiple priors, most recently 04/26/2012.

CLINICAL DATA: History of right breast cancer in 5556 status post
bilateral mastectomies

EXAM:
CT CHEST AND ABDOMEN WITH CONTRAST
TECHNIQUE: Multidetector CT imaging of the chest and abdomen was performed
following the standard protocol during bolus administration of
intravenous contrast.
BUN and creatinine were obtained on site at [HOSPITAL] at
[HOSPITAL].
Results:  BUN 13 mg/dL,  Creatinine 0.6 mg/dL.
CONTRAST:  100mL OMNIPAQUE IOHEXOL 300 MG/ML  SOLN

[Series 2: chest & abd w/ · axial · 0.68mm/px · z∈[-438,-328]mm · 3 of 84 slices shown]
[im 6/84  soft-tissue]
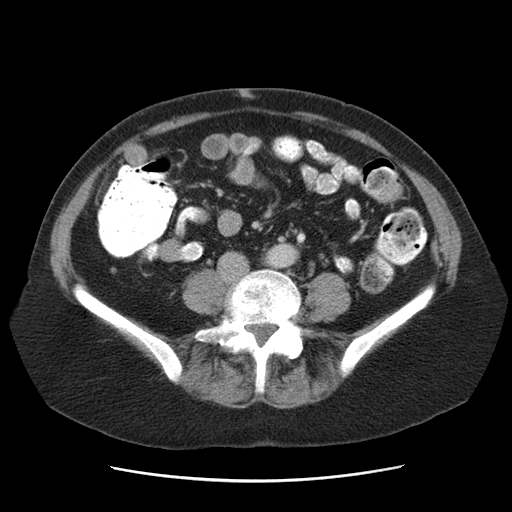
[im 17/84  soft-tissue]
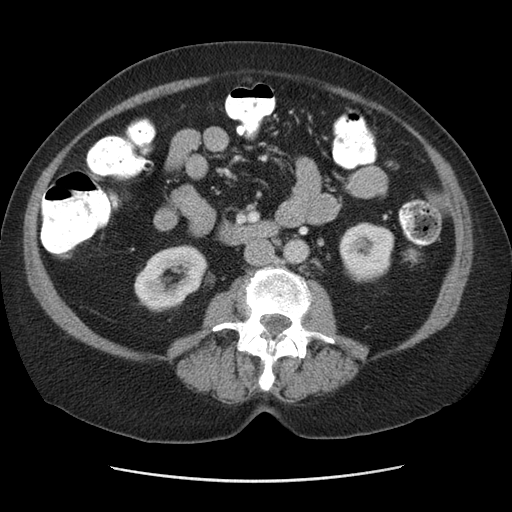
[im 28/84  soft-tissue]
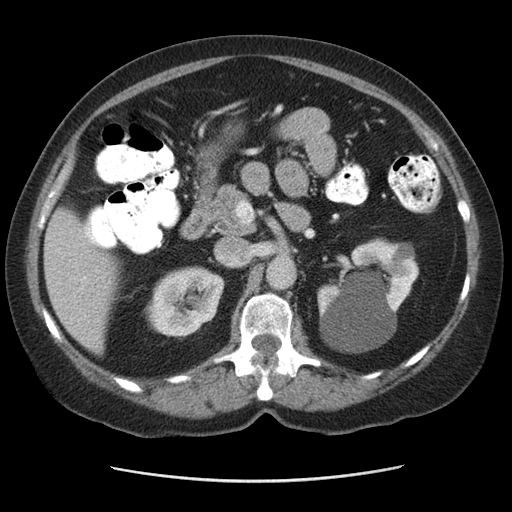

[Series 5: renal delays · axial · 0.70mm/px · z∈[-400,-295]mm · 4 of 35 slices shown, 9 images]
[im 7/35  soft-tissue]
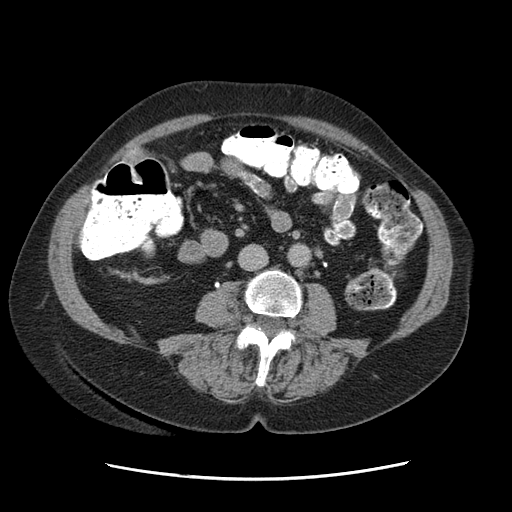
[im 7/35  lung]
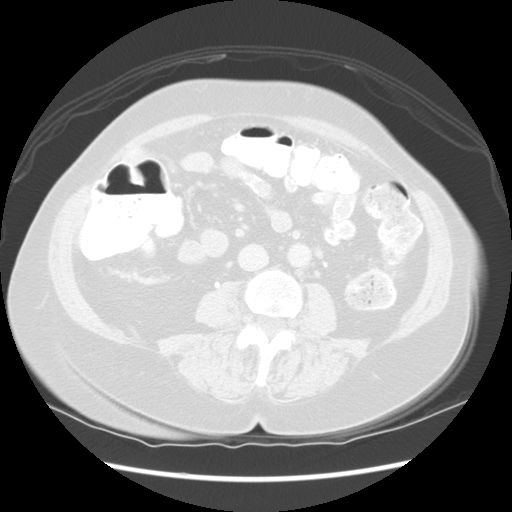
[im 7/35  bone]
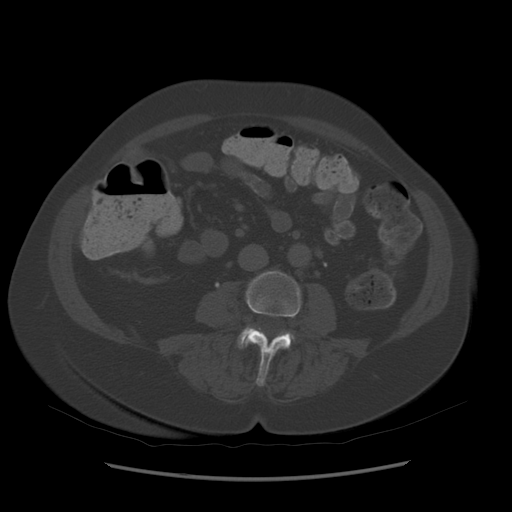
[im 14/35  soft-tissue]
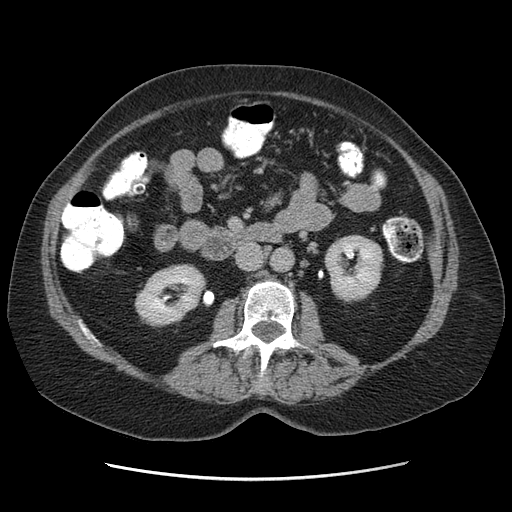
[im 14/35  lung]
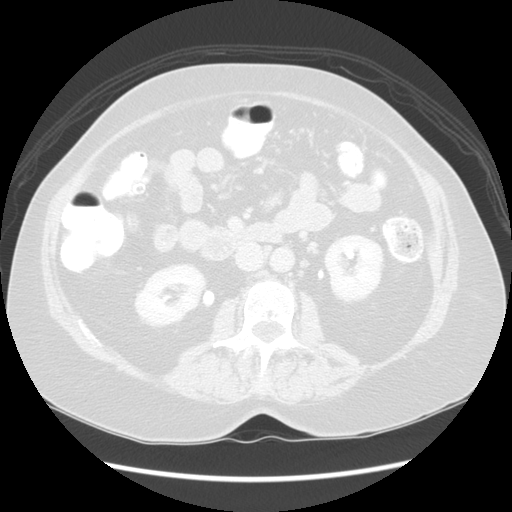
[im 21/35  soft-tissue]
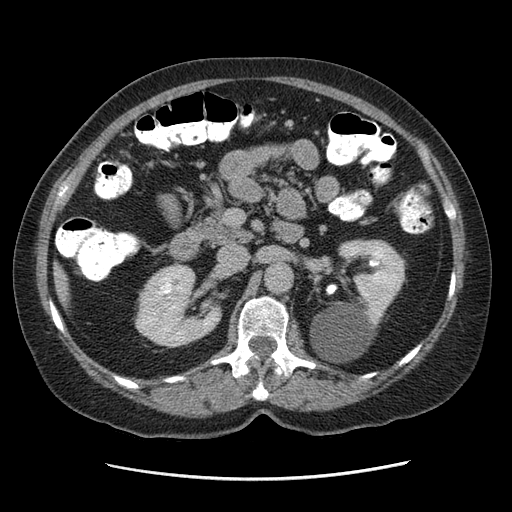
[im 21/35  lung]
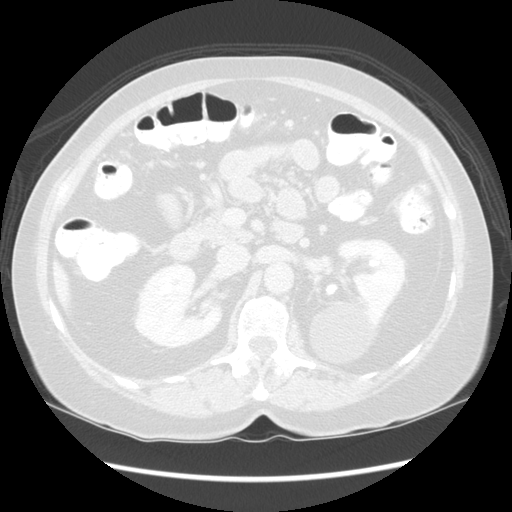
[im 28/35  soft-tissue]
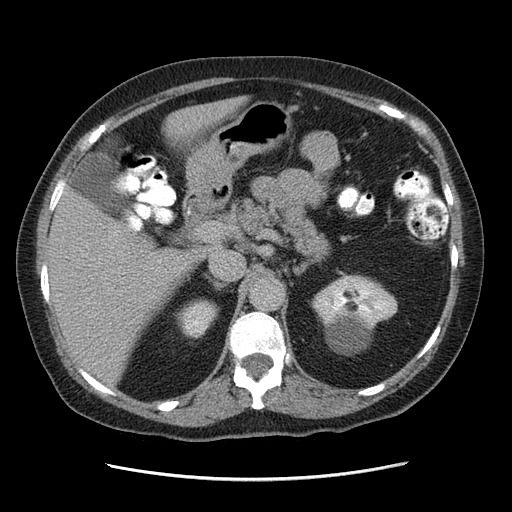
[im 28/35  lung]
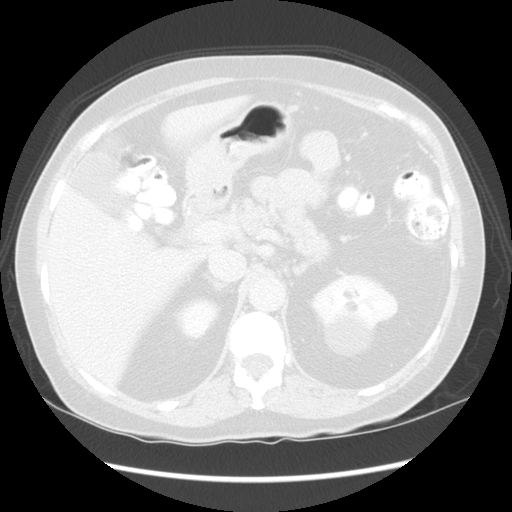

[Series 400: cor · coronal · 0.83mm/px · 3 of 142 slices shown, 4 images]
[im 48/142  soft-tissue]
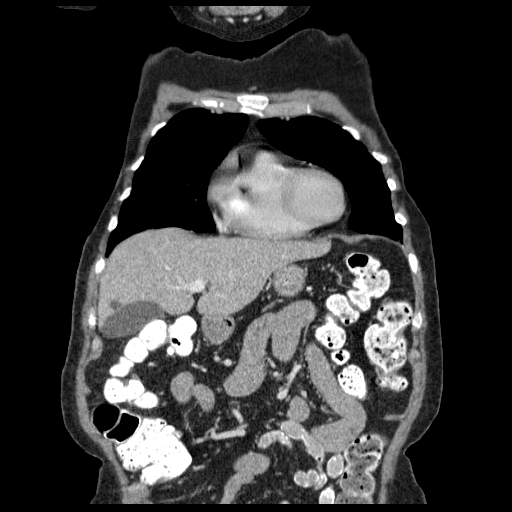
[im 63/142  soft-tissue]
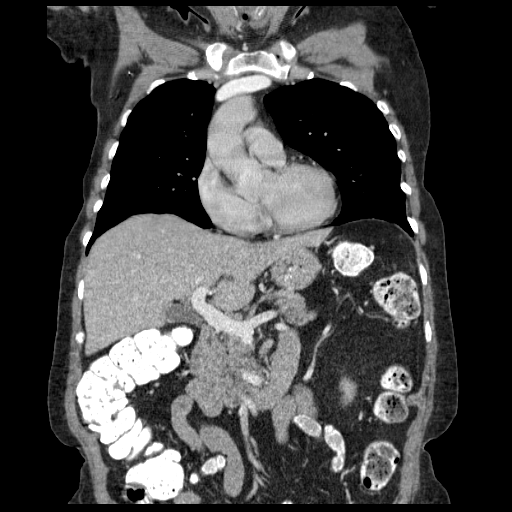
[im 63/142  bone]
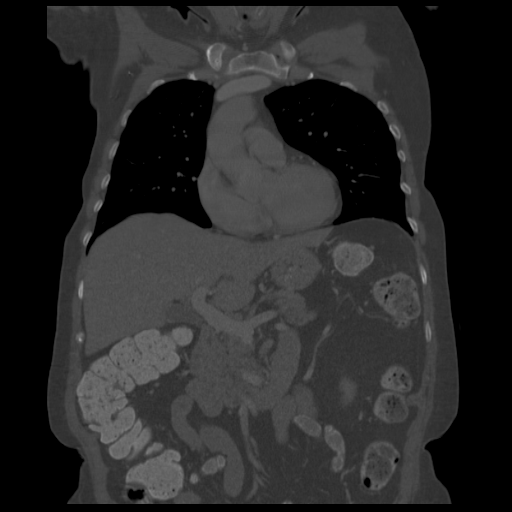
[im 79/142  soft-tissue]
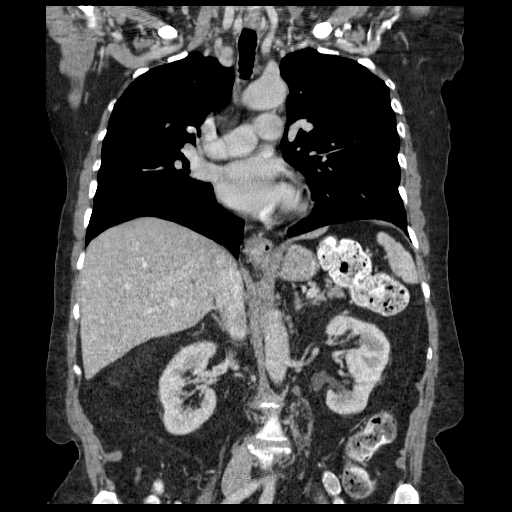

[Series 401: sag · sagittal · 0.83mm/px · 1 of 168 slices shown, 2 images]
[im 56/168  soft-tissue]
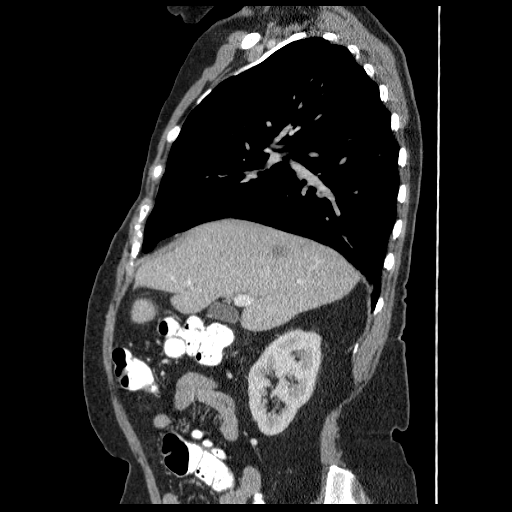
[im 56/168  bone]
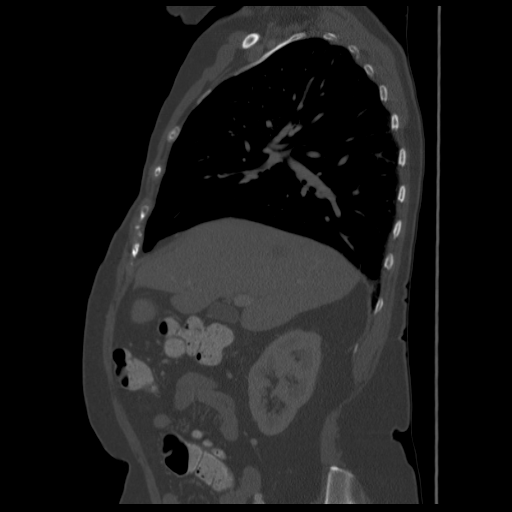

[11 of 46 positions shown; findings below may reference images not displayed]

FINDINGS: CT CHEST FINDINGS

Stable scattered subpleural nodules measuring up to 4 mm in the
right lower lobe (series 3/image 29), left lower lobe (series
3/images 32, 34, and 35), and lingula (series 3/image 34). These
have remained grossly unchanged since 1556 and are benign in
etiology.

No new/suspicious pulmonary nodules. No pleural effusion or
pneumothorax.

Visualized thyroid is unremarkable.

Heart is top-normal in size.  No pericardial effusion.

No suspicious mediastinal, hilar, or axillary lymphadenopathy. 8 mm
short axis subcarinal node (series 2/image 26), within normal
limits.

Status post bilateral mastectomy with axillary lymph node
dissection.

S-shaped thoracolumbar scoliosis. Degenerative changes of the
thoracic spine. Mild superior endplate changes at T12.

CT ABDOMEN FINDINGS

Scattered hepatic cysts, including a 1.6 x 1.3 cm cyst in the
posterior right hepatic dome (series 2/image 42).

Spleen, pancreas, and adrenal glands are within normal limits.

Gallbladder is unremarkable. No intrahepatic or extrahepatic ductal
dilatation.

Left upper pole renal cysts, including a dominant 5.3 x 4.8 cm cyst
(series 2/image 56). Right kidney is unremarkable. No
hydronephrosis.

Visualized bowel is unremarkable.  Normal appendix.

No abdominal ascites.

No suspicious abdominal lymphadenopathy.

S-shaped thoracolumbar scoliosis. Degenerative changes of the
visualized lumbar spine.
IMPRESSION: Status post bilateral mastectomy with axillary lymph node
dissection.

No evidence of recurrent or metastatic disease in the chest or
abdomen.

Scattered small bilateral pulmonary nodules measuring up to 4 mm,
unchanged since 1556, benign.

Additional stable ancillary findings as above.

## 2013-06-30 MED ORDER — IOHEXOL 300 MG/ML  SOLN
100.0000 mL | Freq: Once | INTRAMUSCULAR | Status: AC | PRN
  Administered 2013-06-30: 100 mL via INTRAVENOUS

## 2013-07-15 ENCOUNTER — Other Ambulatory Visit: Payer: Self-pay | Admitting: *Deleted

## 2013-07-15 DIAGNOSIS — M79609 Pain in unspecified limb: Secondary | ICD-10-CM

## 2013-07-15 DIAGNOSIS — M7989 Other specified soft tissue disorders: Secondary | ICD-10-CM

## 2013-08-05 ENCOUNTER — Encounter: Payer: Self-pay | Admitting: Vascular Surgery

## 2013-08-06 ENCOUNTER — Ambulatory Visit (INDEPENDENT_AMBULATORY_CARE_PROVIDER_SITE_OTHER): Payer: Medicare Other | Admitting: Vascular Surgery

## 2013-08-06 ENCOUNTER — Ambulatory Visit (HOSPITAL_COMMUNITY)
Admission: RE | Admit: 2013-08-06 | Discharge: 2013-08-06 | Disposition: A | Discharge status: Home / Self Care | Payer: Medicare Other | Source: Ambulatory Visit | Attending: Vascular Surgery | Admitting: Vascular Surgery

## 2013-08-06 ENCOUNTER — Encounter: Payer: Self-pay | Admitting: Vascular Surgery

## 2013-08-06 VITALS — BP 105/72 | HR 78 | Resp 16 | Ht 65.0 in | Wt 154.0 lb

## 2013-08-06 DIAGNOSIS — M7989 Other specified soft tissue disorders: Secondary | ICD-10-CM

## 2013-08-06 DIAGNOSIS — M79609 Pain in unspecified limb: Secondary | ICD-10-CM | POA: Insufficient documentation

## 2013-08-06 DIAGNOSIS — I872 Venous insufficiency (chronic) (peripheral): Secondary | ICD-10-CM | POA: Insufficient documentation

## 2013-08-06 NOTE — Progress Notes (Signed)
[facility] [place]  Patient name: Diane Ingram MRN: [id number] DOB: Jul 21, 1945 Sex: female  REASON FOR CONSULT: Chronic venous insufficiency  HPI: [name] is a 68 y.o. female with a long history of chronic venous insufficiency. She had laser ablation of the right greater saphenous vein back in 2011 at an outlying vein center. Because of her insurance changed to stop going to the vein center there. She's had some intermittent swelling and pain in both legs but mostly in the right lower extremity. Her symptoms are aggravated by sitting and standing and alleviated somewhat with elevation. She is unaware of any previous history of DVT or phlebitis. She works in a [employer], [employer] and spends most of her time sitting. She does not routinely wear compression stockings.  Past Medical History  Diagnosis Date  . Cancer April 2003    right Breast  . Hyperlipidemia   . Varicose veins   . Leg pain, bilateral    Family History  Problem Relation Age of Onset  . Cancer Mother     breast  . Cancer Father     melanoma   SOCIAL HISTORY: History  Substance Use Topics  . Smoking status: Never Smoker   . Smokeless tobacco: Never Used  . Alcohol Use: No   No Known Allergies Current Outpatient Prescriptions  Medication Sig Dispense Refill  . pravastatin (PRAVACHOL) 40 MG tablet TAKE 1 TABLET IN THE EVENING  30 tablet  7  . raloxifene (EVISTA) 60 MG tablet TAKE 1 TABLET (60 MG TOTAL) BY MOUTH DAILY.  30 tablet  9  . UNABLE TO FIND Rx: L8000- Post Surgical Bras (Quantity: 6) K9326- Non-Silicone Breast Prosthesis (Quantity: 1) Dx: 174.9; Right mastectomy  1 each  0   No current facility-administered medications for this visit.   REVIEW OF SYSTEMS: Valu.Nieves ] denotes positive finding; [  ] denotes negative finding  CARDIOVASCULAR:  [ ]  chest pain   [ ]  chest pressure   [ ]  palpitations   [ ]  orthopnea   [ ]  dyspnea on exertion   [ ]  claudication   [ ]  rest  pain   [ ]  DVT   [ ]  phlebitis PULMONARY:   [ ]  productive cough   [ ]  asthma   [ ]  wheezing NEUROLOGIC:   [ ]  weakness  [ ]  paresthesias  [ ]  aphasia  [ ]  amaurosis  [ ]  dizziness HEMATOLOGIC:   [ ]  bleeding problems   [ ]  clotting disorders MUSCULOSKELETAL:  [ ]  joint pain   [ ]  joint swelling Valu.Nieves ] leg swelling GASTROINTESTINAL: [ ]   blood in stool  [ ]   hematemesis GENITOURINARY:  [ ]   dysuria  [ ]   hematuria PSYCHIATRIC:  [ ]  history of major depression INTEGUMENTARY:  [ ]  rashes  [ ]  ulcers CONSTITUTIONAL:  [ ]  fever   [ ]  chills  PHYSICAL EXAM: Filed Vitals:   08/06/13 1244  BP: 105/72  Pulse: 78  Resp: 16  Height: 5\' 5"  (1.651 m)  Weight: 154 lb (69.854 kg)   Body mass index is 25.63 kg/(m^2). GENERAL: The patient is a well-nourished female, in no acute distress. The vital signs are documented above. CARDIOVASCULAR: There is a regular rate and rhythm. I do not detect carotid bruits. She has palpable femoral pulses and palpable pedal pulses bilaterally. She has mild bilateral lower extremity swelling which is more significant on the right side. PULMONARY: There is good air exchange bilaterally  without wheezing or rales. ABDOMEN: Soft and non-tender with normal pitched bowel sounds.  MUSCULOSKELETAL: There are no major deformities or cyanosis. NEUROLOGIC: No focal weakness or paresthesias are detected. SKIN: She has some mild hyperpigmentation in the right lower extremity. PSYCHIATRIC: The patient has a normal affect.  DATA:  I have independently interpreted her venous duplex scan. In the right lower extremity she does have deep vein reflux in the common femoral vein and posterior tibial vein. There is no DVT on the right. She also has some reflux of the greater saphenous vein in the right thigh and proximal right calf.  On the left side there is no evidence of DVT. There is no evidence of deep vein or superficial venous reflux.  MEDICAL ISSUES:  Chronic venous  insufficiency This patient has deep vein reflux on the right and also some reflux in the right greater saphenous vein although not severe. We have discussed the importance of intermittent leg elevation. I've also written her a prescription for knee-high compression stockings. We've discussed the importance of exercise in the need to avoid prolonged sitting and standing. I've also explained I think water aerobics can be helpful for patients with venous disease. If her symptoms progress would be happy to see her back at any time.    [place] [facility] [place] Beeper: [phone]

## 2013-08-06 NOTE — Assessment & Plan Note (Signed)
This patient has deep vein reflux on the right and also some reflux in the right greater saphenous vein although not severe. We have discussed the importance of intermittent leg elevation. I've also written her a prescription for knee-high compression stockings. We've discussed the importance of exercise in the need to avoid prolonged sitting and standing. I've also explained I think water aerobics can be helpful for patients with venous disease. If her symptoms progress would be happy to see her back at any time.

## 2013-11-03 ENCOUNTER — Other Ambulatory Visit: Payer: Self-pay | Admitting: Internal Medicine

## 2014-06-18 ENCOUNTER — Ambulatory Visit: Payer: Medicare Other | Admitting: Nurse Practitioner

## 2014-08-10 ENCOUNTER — Other Ambulatory Visit: Payer: Self-pay | Admitting: Nurse Practitioner

## 2014-08-10 DIAGNOSIS — Z853 Personal history of malignant neoplasm of breast: Secondary | ICD-10-CM

## 2014-08-10 DIAGNOSIS — Z9013 Acquired absence of bilateral breasts and nipples: Secondary | ICD-10-CM

## 2014-08-13 ENCOUNTER — Ambulatory Visit
Admission: RE | Admit: 2014-08-13 | Discharge: 2014-08-13 | Disposition: A | Discharge status: Home / Self Care | Payer: Medicare Other | Source: Ambulatory Visit | Attending: Nurse Practitioner | Admitting: Nurse Practitioner

## 2014-08-13 DIAGNOSIS — Z9013 Acquired absence of bilateral breasts and nipples: Secondary | ICD-10-CM

## 2014-08-13 DIAGNOSIS — Z853 Personal history of malignant neoplasm of breast: Secondary | ICD-10-CM

## 2014-08-13 IMAGING — CT CT CHEST W/ CM
4 of 5 series · 10 of 46 positions shown, 15 images · IV contrast (isovue)
Comparison: 06/30/2013

CLINICAL DATA: History of right breast cancer with mastectomy.
Prophylactic left mastectomy in 2992.

EXAM:
CT CHEST, ABDOMEN, AND PELVIS WITH CONTRAST
TECHNIQUE: Multidetector CT imaging of the chest, abdomen and pelvis was
performed following the standard protocol during bolus
administration of intravenous contrast.
CONTRAST:  100 cc of Isovue 300

[Series 3: chest/abd/pelvis · axial · 0.70mm/px · z∈[-360,-304]mm · 2 of 78 slices shown]
[im 6/78  soft-tissue]
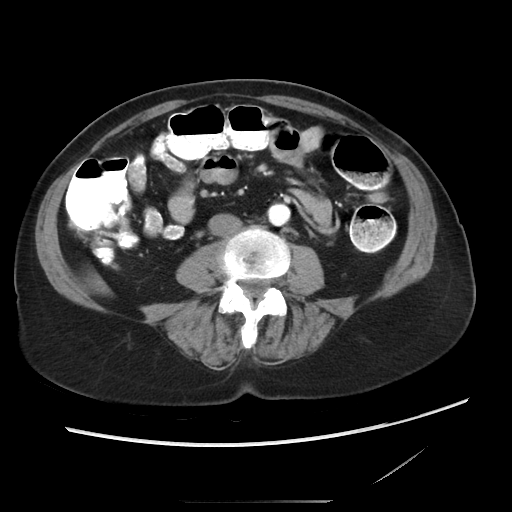
[im 17/78  soft-tissue]
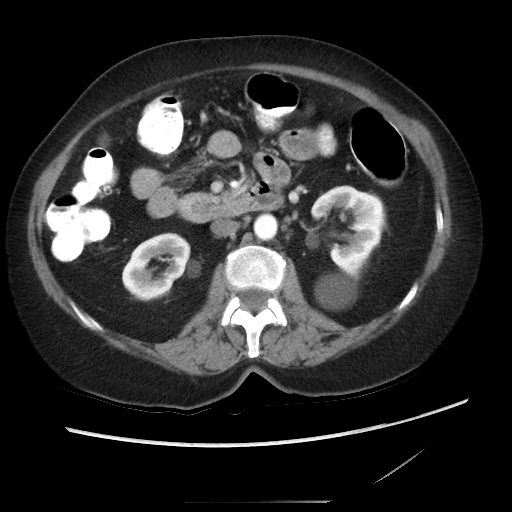

[Series 6: renal delay · axial · delayed · 0.70mm/px · z∈[-325,-240]mm · 4 of 29 slices shown, 9 images]
[im 6/29  soft-tissue]
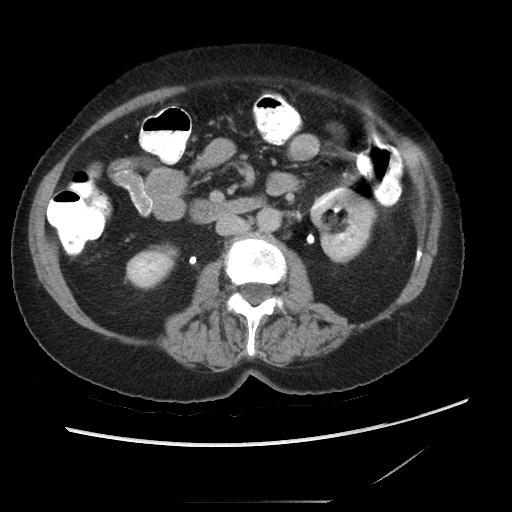
[im 6/29  lung]
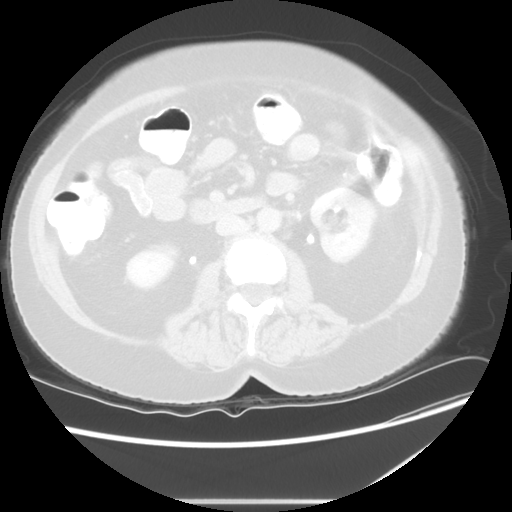
[im 6/29  bone]
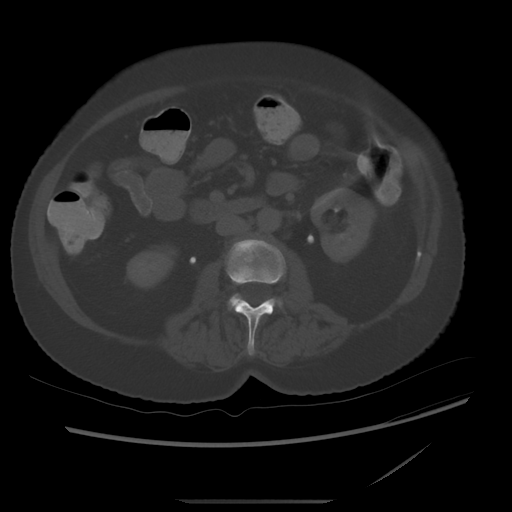
[im 12/29  soft-tissue]
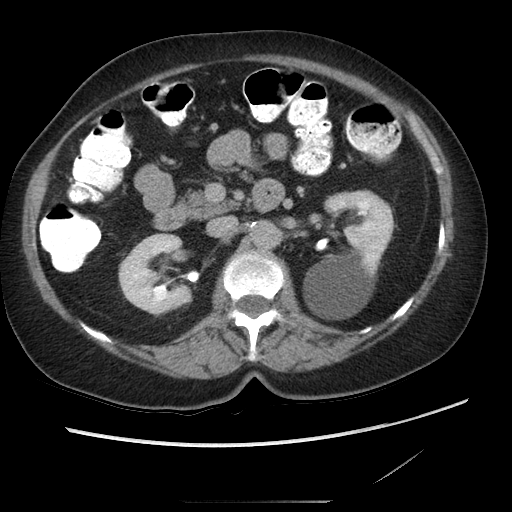
[im 12/29  lung]
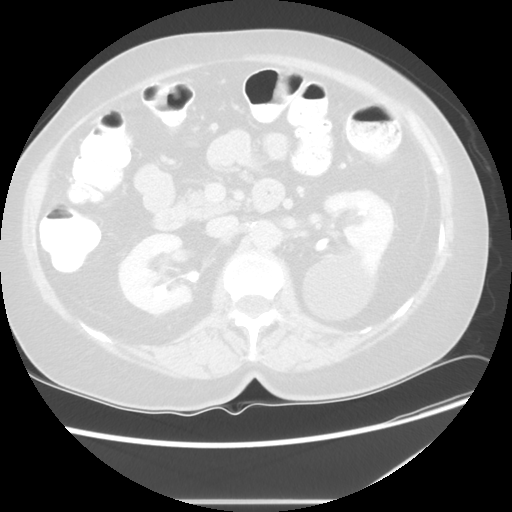
[im 17/29  soft-tissue]
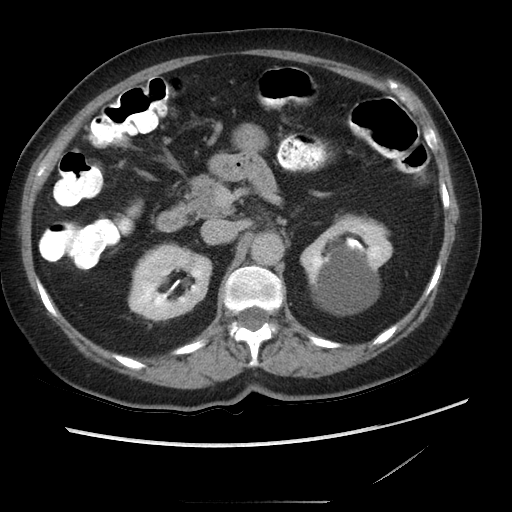
[im 17/29  lung]
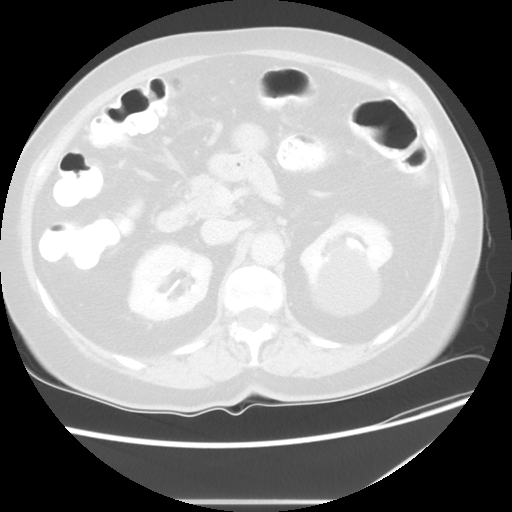
[im 23/29  soft-tissue]
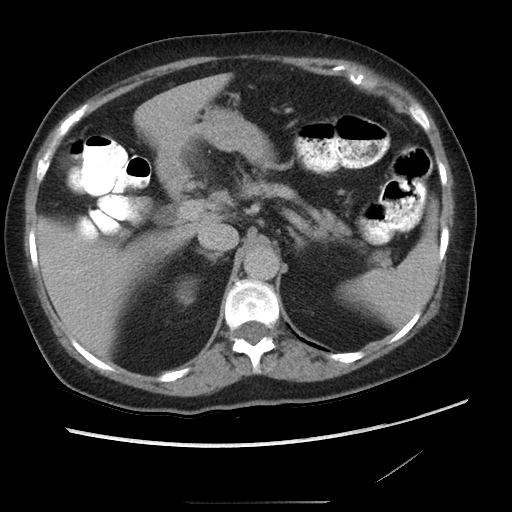
[im 23/29  lung]
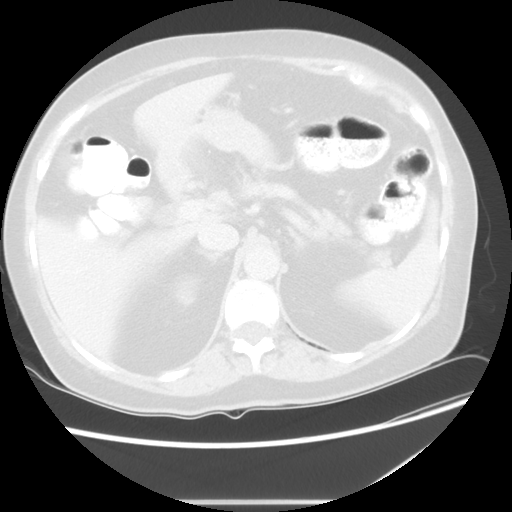

[Series 200: coronal · coronal · 0.77mm/px · 3 of 145 slices shown]
[im 49/145  soft-tissue]
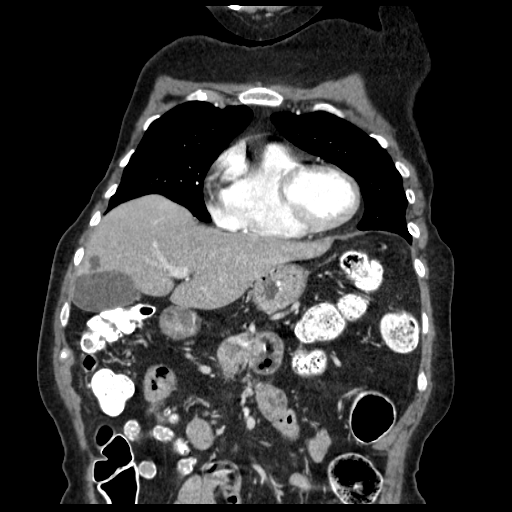
[im 65/145  soft-tissue]
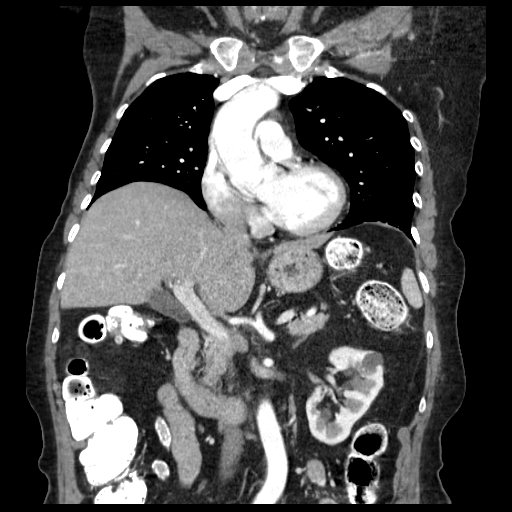
[im 81/145  soft-tissue]
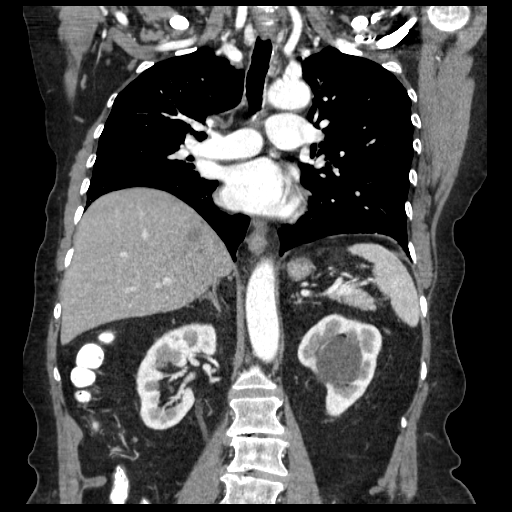

[Series 201: sagittal · sagittal · 0.39mm/px · 1 of 177 slices shown]
[im 59/177  soft-tissue]
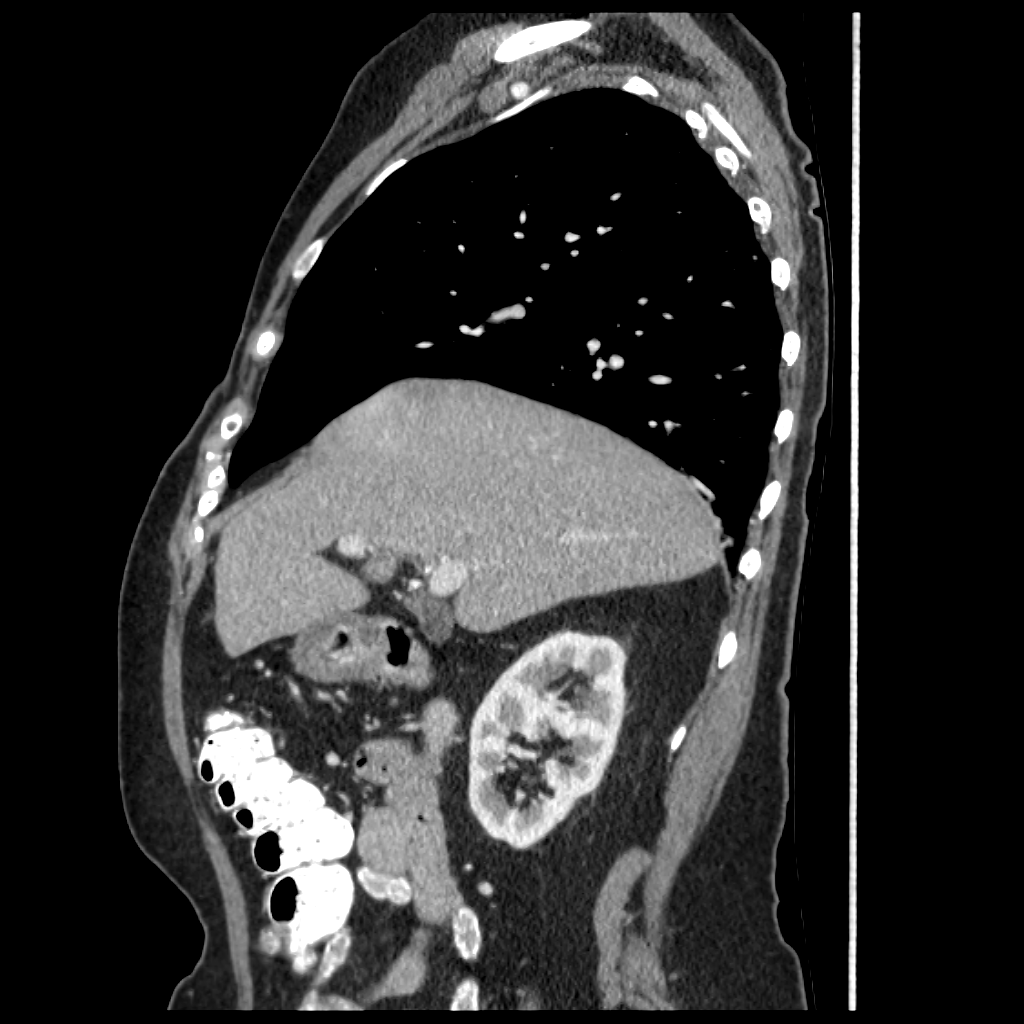

[10 of 46 positions shown; findings below may reference images not displayed]

FINDINGS: CT CHEST FINDINGS

Mediastinum: There is no pericardial effusion. No mediastinal or
hilar adenopathy. The trachea appears patent and is midline. Normal
appearance of the esophagus. No mediastinal or hilar adenopathy
noted. Previous bilateral mastectomy.

Lungs/Pleura: No pleural effusion. New small 5 mm subpleural nodule
is identified in the superior segment of left lower lobe. Left lower
lobe nodule is stable measuring 4 mm, image 33/series 4. Also stable
is a 4 mm subpleural nodule in the left lower lobe. Anterior right
upper lobe nodule is stable measuring 3 mm.

Musculoskeletal: Review of the visualized osseous structures is
negative for aggressive lytic or sclerotic bone lesion. There is a
scoliosis deformity noted.

CT ABDOMEN AND PELVIS FINDINGS

Hepatobiliary: Scattered liver cysts are again identified.
Intermediate attenuating structure within the posterior right
hepatic lobe measures 1.5 x 1.1 cm, image 37/series 3. This is
unchanged from previous exam and likely reflects a benign liver
abnormality. The gallbladder appears normal. No biliary dilatation.

Pancreas: Negative.

Spleen: Negative.

Adrenals/Urinary Tract: Normal adrenal glands. Similar appearance of
left kidney cysts. The largest measures 5.8 x 6.0 cm, image
68/series 3.

Stomach/Bowel: The stomach is within normal limits. The visualized
small and large bowel loops are unremarkable.

Vascular/Lymphatic: Normal appearance of the abdominal aorta. No
enlarged retroperitoneal or mesenteric adenopathy.

Other: There is no ascites or focal fluid collections within the
abdomen.

Musculoskeletal: Review of the visualized bony structures is
negative for aggressive lytic or sclerotic bone lesion. Degenerative
disc disease is noted within the lumbar spine.
IMPRESSION: 1. No acute findings within the chest or abdomen. No specific
features identified to suggest metastatic disease.
2. Again noted are several small pulmonary nodules in both lungs.
There is a single new nodule within the superior segment of left
lower lobe. This measures 5 mm. Attention this nodule on follow-up
imaging recommended. The other small nodules are stable.
3. No change in small low attenuation structures within the liver,
findings are likely the sequelae of at benign process.

## 2014-08-13 MED ORDER — IOPAMIDOL (ISOVUE-300) INJECTION 61%
100.0000 mL | Freq: Once | INTRAVENOUS | Status: AC | PRN
  Administered 2014-08-13: 100 mL via INTRAVENOUS

## 2014-09-11 ENCOUNTER — Encounter: Payer: Self-pay | Admitting: Pulmonary Disease

## 2014-09-11 ENCOUNTER — Encounter (INDEPENDENT_AMBULATORY_CARE_PROVIDER_SITE_OTHER): Payer: Self-pay

## 2014-09-11 ENCOUNTER — Ambulatory Visit (INDEPENDENT_AMBULATORY_CARE_PROVIDER_SITE_OTHER): Payer: Medicare Other | Admitting: Pulmonary Disease

## 2014-09-11 DIAGNOSIS — R918 Other nonspecific abnormal finding of lung field: Secondary | ICD-10-CM

## 2014-09-11 NOTE — Progress Notes (Signed)
   Subjective:    Patient ID: Diane Ingram, female    DOB: Jul 21, 1945, 69 y.o.   MRN: [id number]  HPI The patient is a well-developed 69 year old female who I've been asked to see for a pulmonary nodule. She has a history of breast cancer dating back to 2003, but is felt to be cured with no evidence for recurrence. She has had serial CTs of her chest abdomen and pelvis over the years, and has been found to have pulmonary nodules dating back at least to 2011. These have been completely stable on surveillance scans, including the most recent. She has been found to have a new 5 mm nodule and this appears segment of the left lower lobe which is subpleural in nature similar to her other nodules. The patient has never smoked, and has no other history of cancer. She has never lived in the [place] or [place], and has no history of TB exposure. She does get a PPD every 2 years at work and these have been negative. She has no weight loss, and has been eating well.   Review of Systems  Constitutional: Negative for fever and unexpected weight change.  HENT: Negative for congestion, dental problem, ear pain, nosebleeds, postnasal drip, rhinorrhea, sinus pressure, sneezing, sore throat and trouble swallowing.   Eyes: Negative for redness and itching.  Respiratory: Negative for cough, chest tightness, shortness of breath and wheezing.   Cardiovascular: Negative for palpitations and leg swelling.  Gastrointestinal: Negative for nausea and vomiting.  Genitourinary: Negative for dysuria.  Musculoskeletal: Negative for joint swelling.  Skin: Negative for rash.  Neurological: Negative for headaches.  Hematological: Does not bruise/bleed easily.  Psychiatric/Behavioral: Negative for dysphoric mood. The patient is not nervous/anxious.        Objective:   Physical Exam Constitutional:  Well developed, no acute distress  HENT:  Nares patent without discharge  Oropharynx without exudate, palate and uvula are  normal  Eyes:  Perrla, eomi, no scleral icterus  Neck:  No JVD, no TMG  Cardiovascular:  Normal rate, regular rhythm, no rubs or gallops.  No murmurs        Intact distal pulses  Pulmonary :  Normal breath sounds, no stridor or respiratory distress   No rales, rhonchi, or wheezing  Abdominal:  Soft, nondistended, bowel sounds present.  No tenderness noted.   Musculoskeletal:  No lower extremity edema noted.  Lymph Nodes:  No cervical lymphadenopathy noted  Skin:  No cyanosis noted  Neurologic:  Alert, appropriate, moves all 4 extremities without obvious deficit.         Assessment & Plan:

## 2014-09-11 NOTE — Patient Instructions (Signed)
You have a tiny spot that is new, but all your others remain completely stable.  This very unlikely to be a malignancy, but does need to be followed. Will set up for a scan in 30mos, and call you with results.

## 2014-09-11 NOTE — Assessment & Plan Note (Signed)
The patient has a long history of bilateral pulmonary nodules dating back to at least 2011, and all of these have been stable on her current scan. She does have a new 5 mm subpleural nodule in the superior segment of the left lower lobe, and more than likely this is benign as well. She does have a history of breast cancer dating back to 2003, and is felt to be cured. However, by Fleischner criteria she will need follow-up at 6-12 months. She has no history of living in the [place] or [place], and has had a negative PPD at work. I will schedule her for a follow-up noncontrasted CT in 6 months, and call her with the results.

## 2015-01-12 ENCOUNTER — Telehealth: Payer: Self-pay | Admitting: Pulmonary Disease

## 2015-01-12 DIAGNOSIS — R911 Solitary pulmonary nodule: Secondary | ICD-10-CM

## 2015-01-12 NOTE — Telephone Encounter (Signed)
Ct chest w/o was ordered by [facility] and is scheduled for 02/17/15 for follow up pulmonary nodules. Pt is wanting this to be done sooner but also we need to change ordering user since [facility] no longer here. Pt not set up with a new provider. Please advise TP if okay to sign under your name?

## 2015-01-13 NOTE — Telephone Encounter (Signed)
Patient needs CT Chest.  Is it okay to order under Rexene Edison, NP  Tammy - please advise.

## 2015-01-14 NOTE — Telephone Encounter (Signed)
That is fine , when does she want it,  Will need ov to discuss results

## 2015-01-14 NOTE — Telephone Encounter (Signed)
lmtcb for pt.  

## 2015-01-15 NOTE — Telephone Encounter (Signed)
lmtcb for pt. lmtcb for [employer].

## 2015-01-18 NOTE — Telephone Encounter (Signed)
Chest ct has been moved to 01/22/15@10am  and pt is aware Diane Ingram

## 2015-01-18 NOTE — Telephone Encounter (Signed)
Patient requesting that CT be done earlier than 02/17/15.  She says that there is a girl leaving for maternity leave and she will need to be at work.  She said that if she cannot get an earlier appointment, she wants to keep the 9/28 appointment.    New order for CT entered since last order was entered in Dr. Gwenette Greet name.   FYI to [facility] Nothing further needed.

## 2015-01-18 NOTE — Telephone Encounter (Signed)
Pt returning call and can be reached @ [phone].Diane Ingram

## 2015-01-22 ENCOUNTER — Ambulatory Visit (INDEPENDENT_AMBULATORY_CARE_PROVIDER_SITE_OTHER)
Admission: RE | Admit: 2015-01-22 | Discharge: 2015-01-22 | Disposition: A | Discharge status: Home / Self Care | Payer: Medicare Other | Source: Ambulatory Visit | Attending: Adult Health | Admitting: Adult Health

## 2015-01-22 ENCOUNTER — Telehealth: Payer: Self-pay | Admitting: Pulmonary Disease

## 2015-01-22 DIAGNOSIS — R911 Solitary pulmonary nodule: Secondary | ICD-10-CM

## 2015-01-22 IMAGING — CT CT CHEST W/O CM
2 of 4 series · 15 of 36 positions shown, 18 images · non-contrast
Comparison: 08/13/2014

CLINICAL DATA: History of breast cancer. Followup pulmonary nodule.

EXAM:
CT CHEST WITHOUT CONTRAST
TECHNIQUE: Multidetector CT imaging of the chest was performed following the
standard protocol without IV contrast.

[Series 2: chest routine with · axial · 0.67mm/px · z∈[-215,-15]mm · 12 of 48 slices shown, 15 images]
[im 4/48  mediastinal]
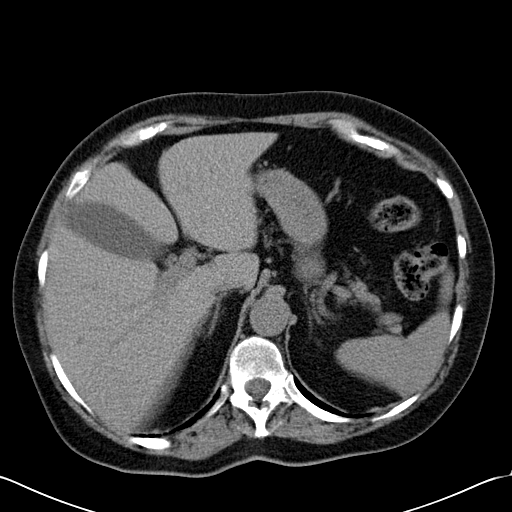
[im 4/48  lung]
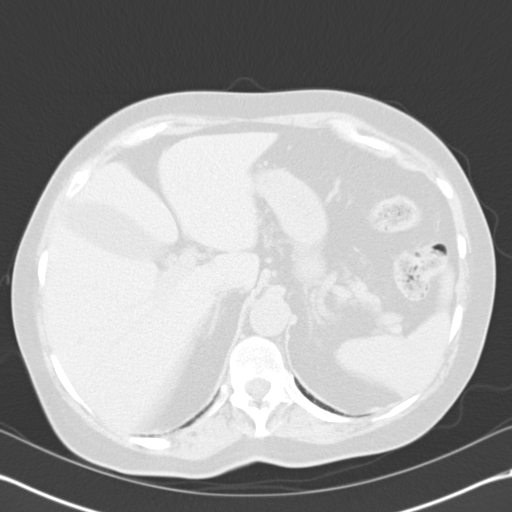
[im 8/48  lung]
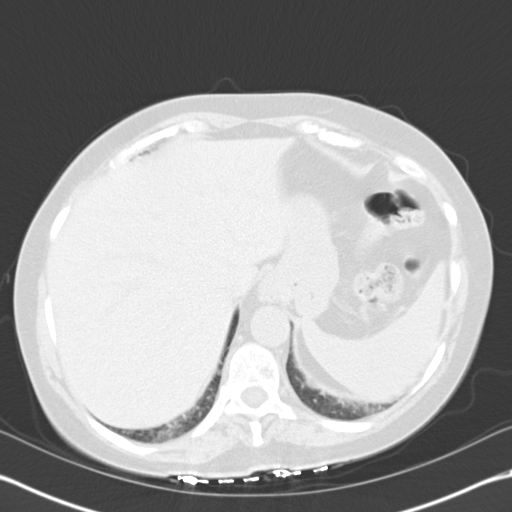
[im 11/48  lung]
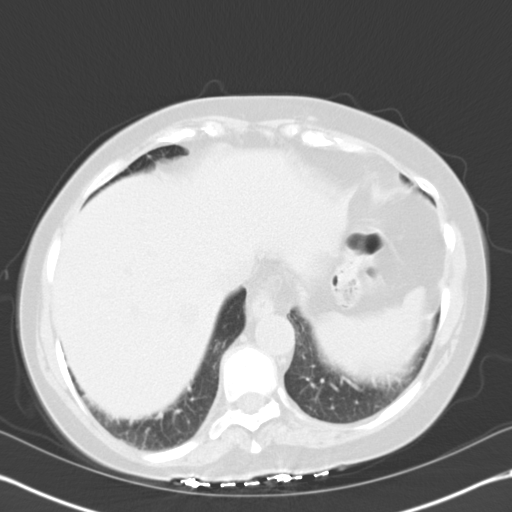
[im 15/48  lung]
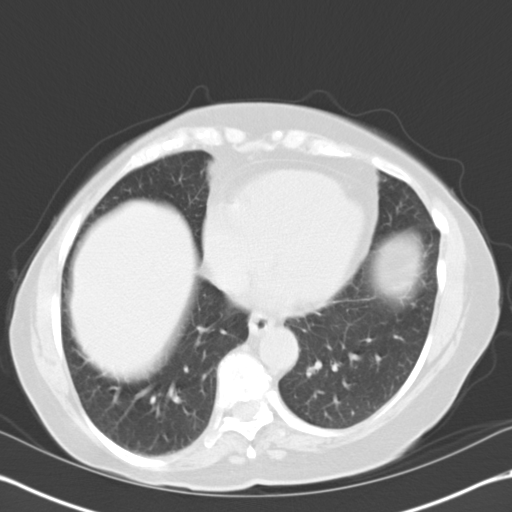
[im 19/48  mediastinal]
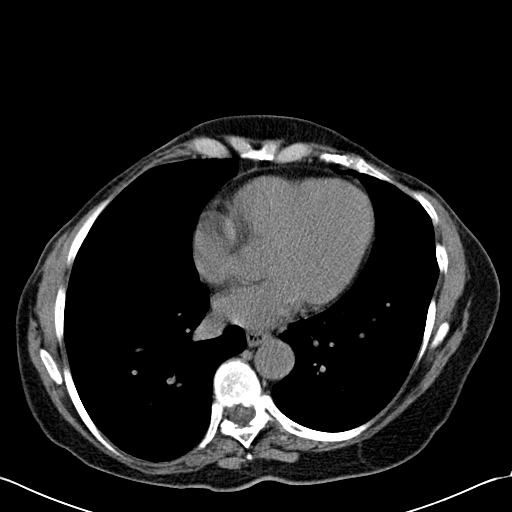
[im 19/48  lung]
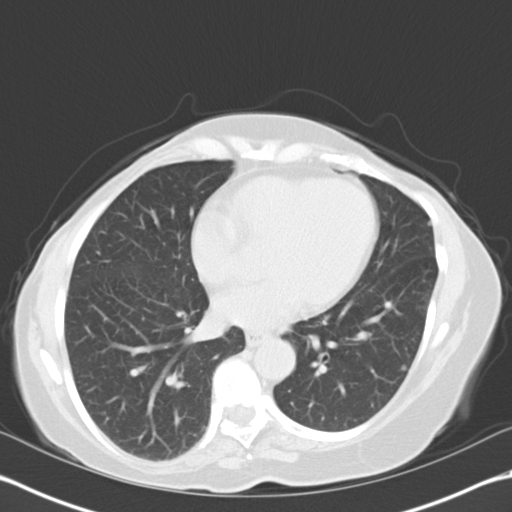
[im 22/48  lung]
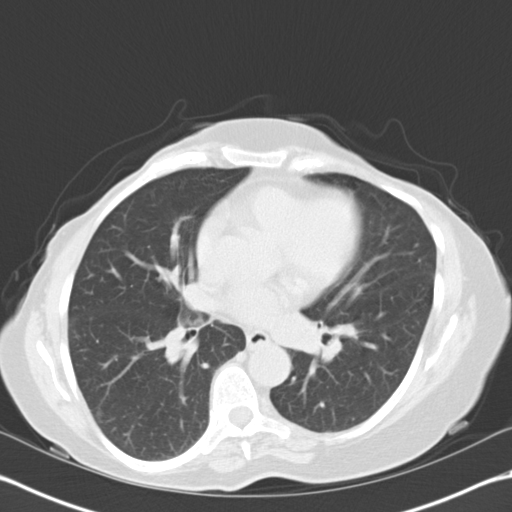
[im 26/48  lung]
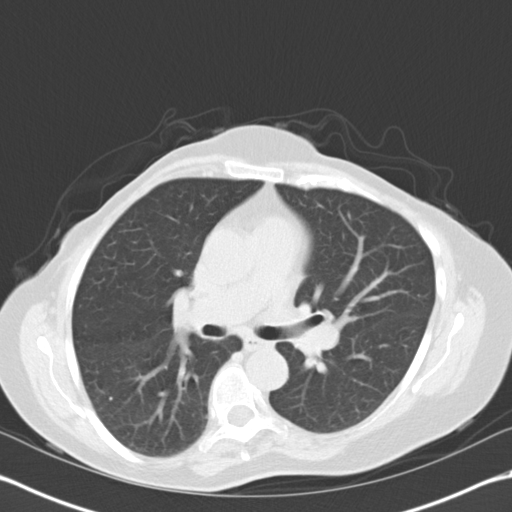
[im 29/48  lung]
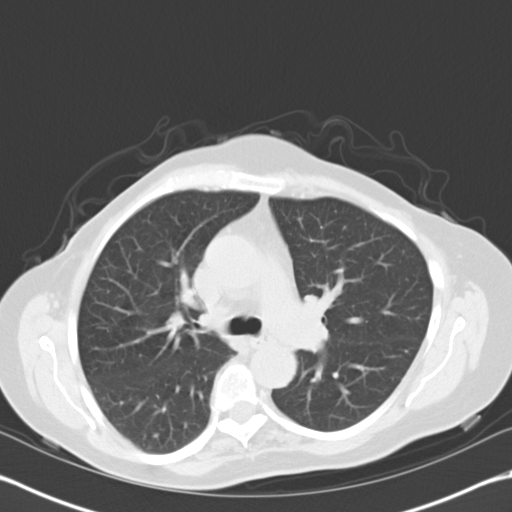
[im 33/48  mediastinal]
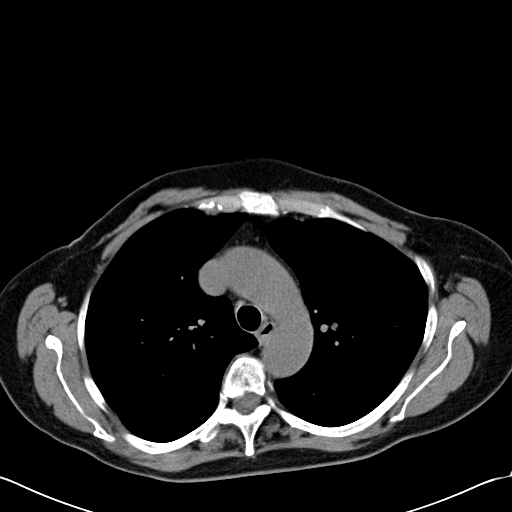
[im 33/48  lung]
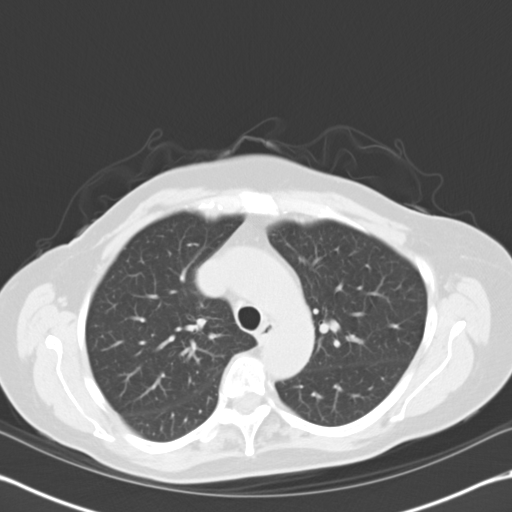
[im 37/48  lung]
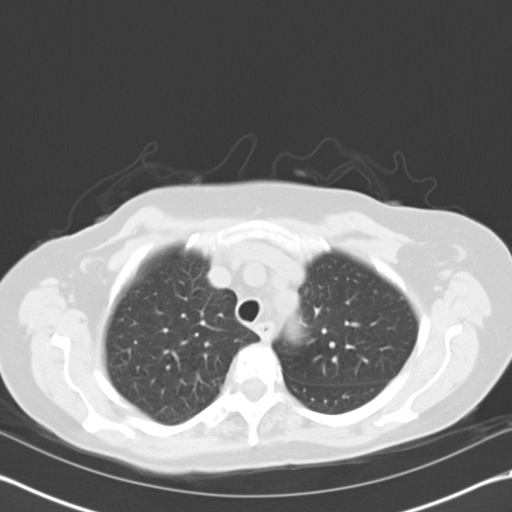
[im 40/48  lung]
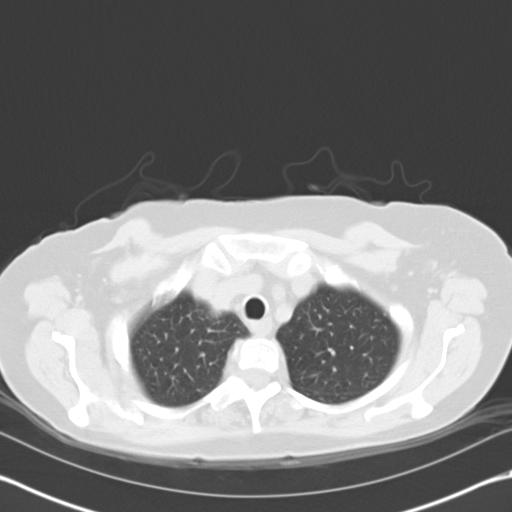
[im 44/48  lung]
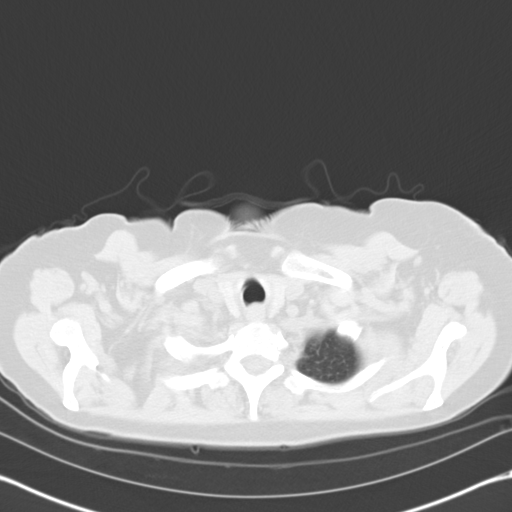

[Series 602: cor · coronal · 0.67mm/px · 3 of 111 slices shown]
[im 23/111  lung]
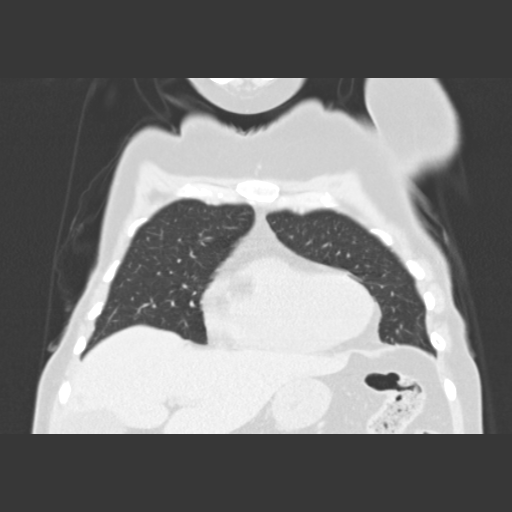
[im 45/111  lung]
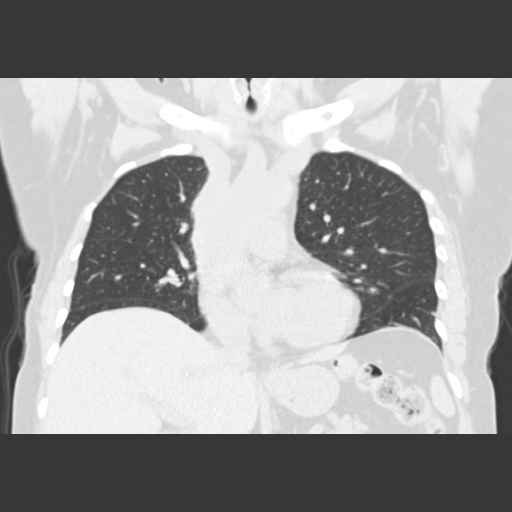
[im 67/111  lung]
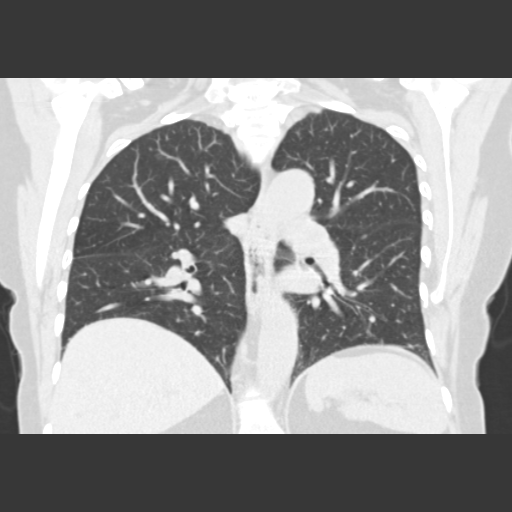

[15 of 36 positions shown; findings below may reference images not displayed]

FINDINGS: Mediastinum/Nodes: Bilateral mastectomy and axillary node
dissection. No axillary adenopathy. No subpectoral adenopathy.
Tortuous thoracic aorta. Mild cardiomegaly. No mediastinal or
definite hilar adenopathy, given limitations of unenhanced CT. No
internal mammary adenopathy.

Lungs/Pleura: No pleural fluid. Minimal motion degradation.
Subpleural 4 mm right lower lobe pulmonary nodule on image 26 which
is unchanged.

3 mm subpleural lingular nodule on image 30 is similar.

4 mm left lower lobe subpleural pulmonary nodule on image 31 is
unchanged. A more lateral subpleural left lower lobe 3 mm nodule on
image 32 is similar.

Perifissural right upper lobe pulmonary nodule on image 25 is
unchanged.

The superior segment left lower lobe subpleural nodule described on
the prior exam is no longer identified.

No new nodules.

Upper abdomen: Scattered well-circumscribed low-density liver
lesions which are unchanged and likely cysts and complex cysts.
Normal imaged portions of the spleen, stomach, adrenal glands,
gallbladder.

Musculoskeletal: Left-sided T10 lucent lesion is unchanged and
likely a hemangioma. Convex right thoracic spine curvature is
moderate.
IMPRESSION: 1. The superior segment left lower lobe pulmonary nodule described
on the prior exam has resolved. Other pulmonary nodules are similar.
These are likely subpleural lymph nodes.
2. Status post bilateral mastectomy and axillary node dissection,
without evidence of metastatic disease.

## 2015-01-22 NOTE — Progress Notes (Signed)
Quick Note:  LVM for pt to return call ______ 

## 2015-01-22 NOTE — Telephone Encounter (Signed)
Spoke with Diane Ingram and reviewed results and recs per TP:  Notes Recorded by Melvenia Needles, NP on 01/22/2015 at 1:08 PM LLL nodule has resolved  All other nodule are similar (dating back to 2011 c/w benign process)  May make Ov to discuss in detail   Diane Ingram voiced understanding and had no further questions. She stated that she would call back to schedule her appointment. Nothing further needed

## 2015-01-22 NOTE — Progress Notes (Signed)
Quick Note:  Called and spoke with pt. Reviewed results and recs. Pt stated she would call back to make appointment. Pt voiced understanding and had no further questions. ______

## 2015-02-17 ENCOUNTER — Inpatient Hospital Stay: Admission: RE | Admit: 2015-02-17 | Payer: Medicare Other | Source: Ambulatory Visit

## 2015-06-24 DIAGNOSIS — L820 Inflamed seborrheic keratosis: Secondary | ICD-10-CM | POA: Diagnosis not present

## 2015-08-30 DIAGNOSIS — L570 Actinic keratosis: Secondary | ICD-10-CM | POA: Diagnosis not present

## 2015-08-30 DIAGNOSIS — X32XXXD Exposure to sunlight, subsequent encounter: Secondary | ICD-10-CM | POA: Diagnosis not present

## 2015-09-03 DIAGNOSIS — J209 Acute bronchitis, unspecified: Secondary | ICD-10-CM | POA: Diagnosis not present

## 2015-09-09 DIAGNOSIS — R05 Cough: Secondary | ICD-10-CM | POA: Diagnosis not present

## 2015-09-23 ENCOUNTER — Other Ambulatory Visit: Payer: Self-pay | Admitting: Nurse Practitioner

## 2015-09-23 DIAGNOSIS — Z853 Personal history of malignant neoplasm of breast: Secondary | ICD-10-CM

## 2015-09-23 DIAGNOSIS — Z9010 Acquired absence of unspecified breast and nipple: Secondary | ICD-10-CM

## 2015-09-30 ENCOUNTER — Ambulatory Visit
Admission: RE | Admit: 2015-09-30 | Discharge: 2015-09-30 | Disposition: A | Discharge status: Home / Self Care | Payer: Medicare Other | Source: Ambulatory Visit | Attending: Nurse Practitioner | Admitting: Nurse Practitioner

## 2015-09-30 DIAGNOSIS — Z9010 Acquired absence of unspecified breast and nipple: Secondary | ICD-10-CM

## 2015-09-30 DIAGNOSIS — R918 Other nonspecific abnormal finding of lung field: Secondary | ICD-10-CM | POA: Diagnosis not present

## 2015-09-30 DIAGNOSIS — Z853 Personal history of malignant neoplasm of breast: Secondary | ICD-10-CM

## 2015-09-30 DIAGNOSIS — N281 Cyst of kidney, acquired: Secondary | ICD-10-CM | POA: Diagnosis not present

## 2015-09-30 IMAGING — CT CT CHEST W/ CM
2 of 6 series · 15 of 46 positions shown, 17 images · IV contrast (APPLIED)
Comparison: CT 01/22/2015, 08/13/2014 and other studies dating back
to 10/10/2004.

CLINICAL DATA: Follow up pulmonary nodules and renal cysts. History
of right breast cancer in 4229. Creatinine was obtained on site at
[HOSPITAL] at [HOSPITAL].Results: Creatinine
mg/dL.

EXAM:
CT CHEST AND ABDOMEN WITH CONTRAST
TECHNIQUE: Multidetector CT imaging of the chest and abdomen was performed
following the standard protocol during bolus administration of
intravenous contrast.
CONTRAST:  100mL 2OXQKI-SPP IOPAMIDOL (2OXQKI-SPP) INJECTION 61%

[Series 3: cor cor · coronal · 0.63mm/px · 3 of 85 slices shown]
[im 29/85  soft-tissue]
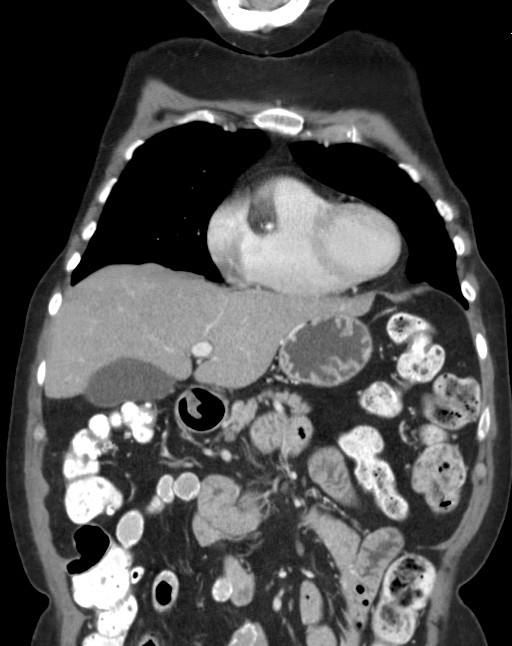
[im 38/85  soft-tissue]
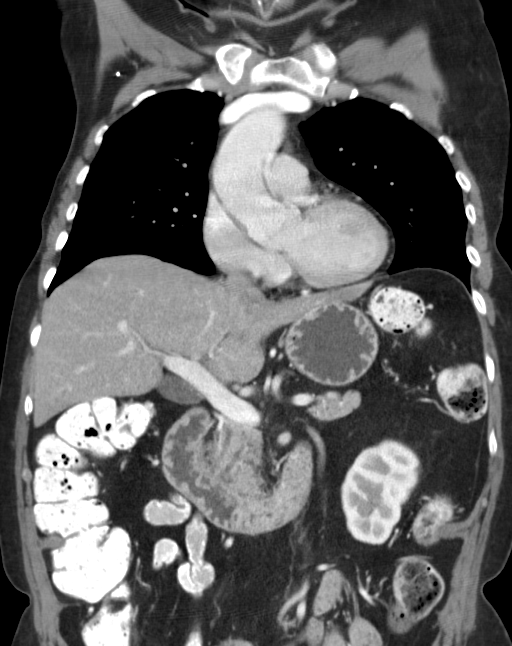
[im 47/85  soft-tissue]
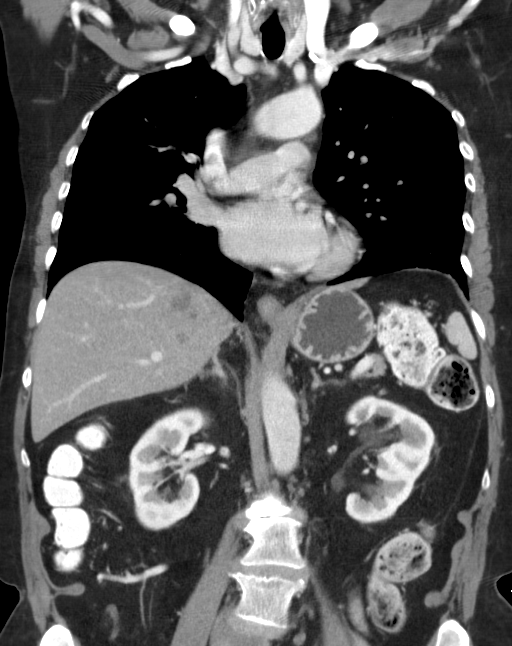

[Series 5: super d · axial · 0.68mm/px · z∈[-404,-197]mm · 12 of 214 slices shown, 14 images]
[im 13/214  soft-tissue]
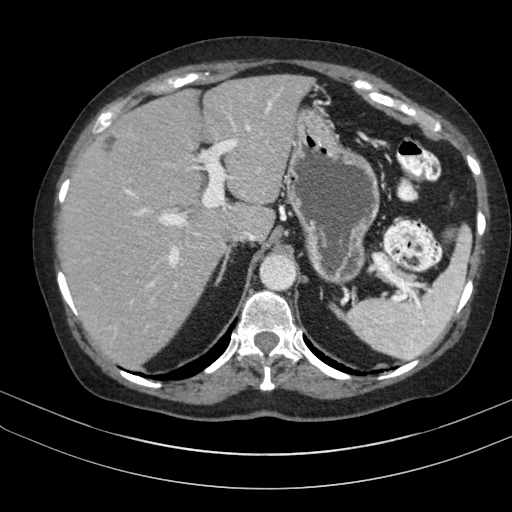
[im 13/214  bone]
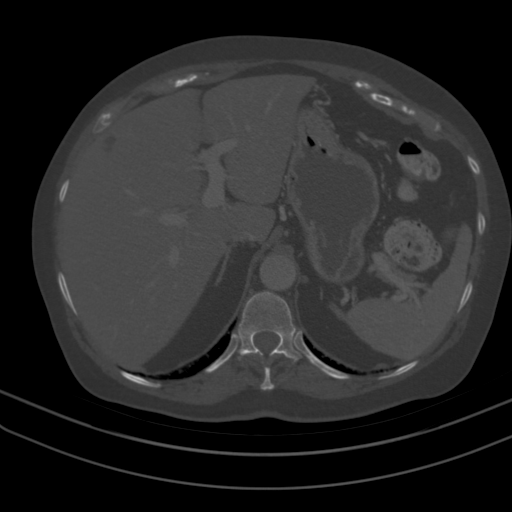
[im 38/214  soft-tissue]
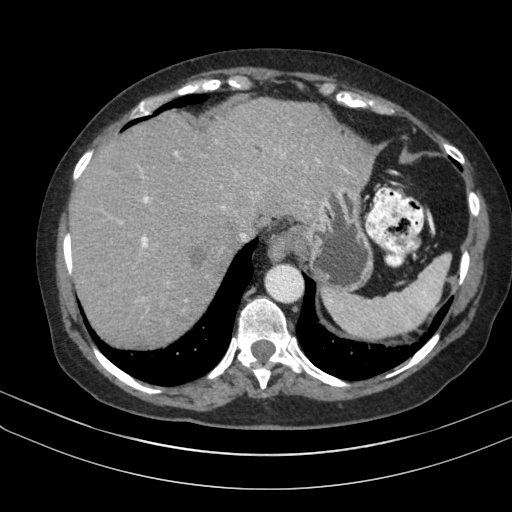
[im 51/214  soft-tissue]
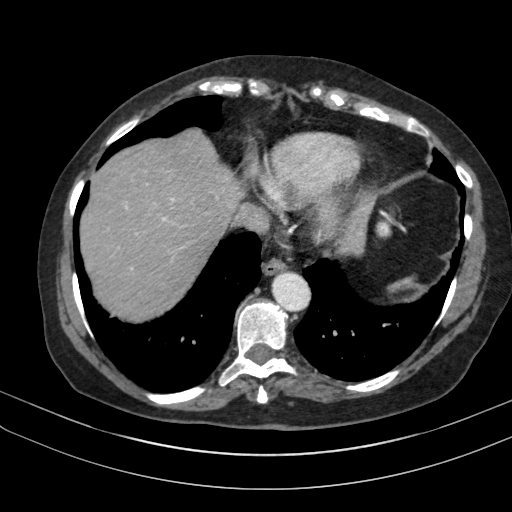
[im 63/214  soft-tissue]
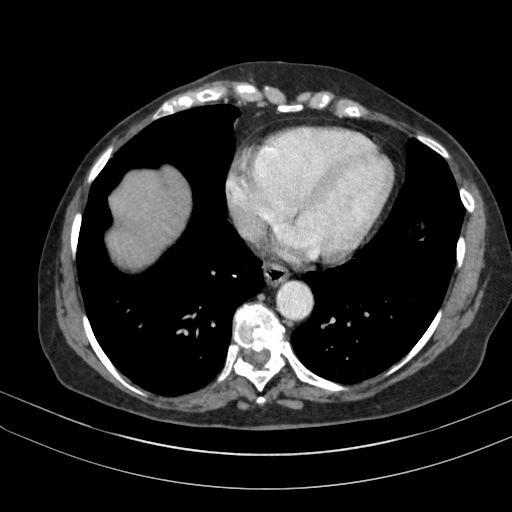
[im 88/214  soft-tissue]
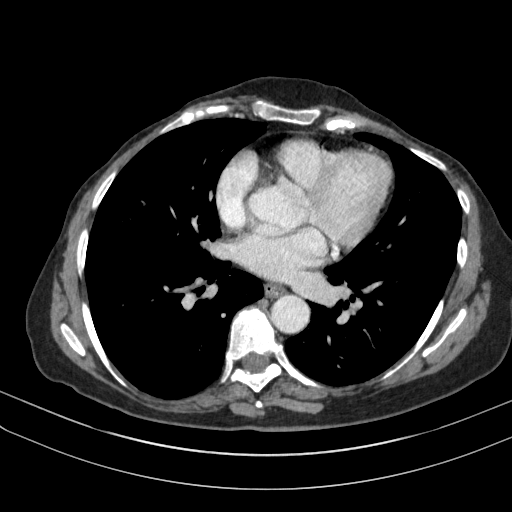
[im 101/214  soft-tissue]
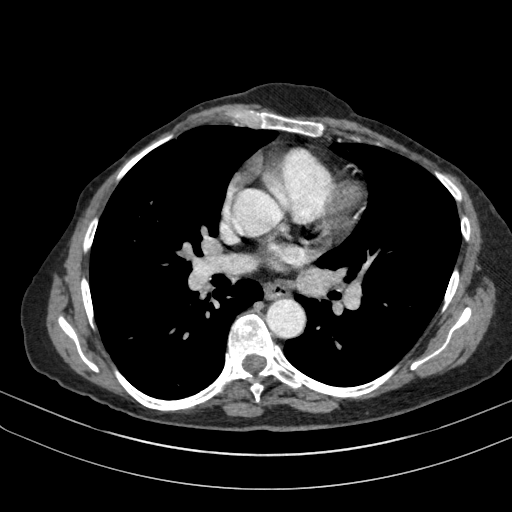
[im 113/214  soft-tissue]
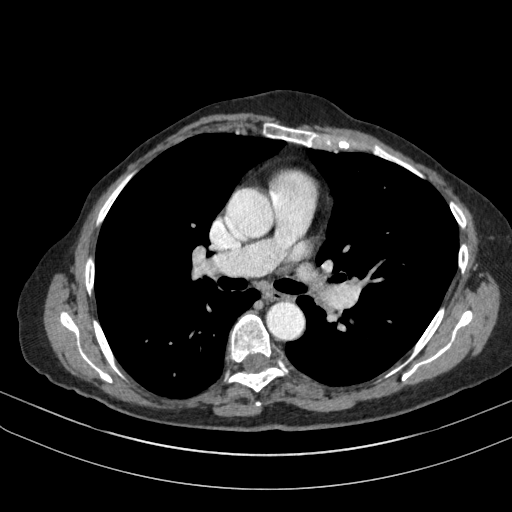
[im 138/214  soft-tissue]
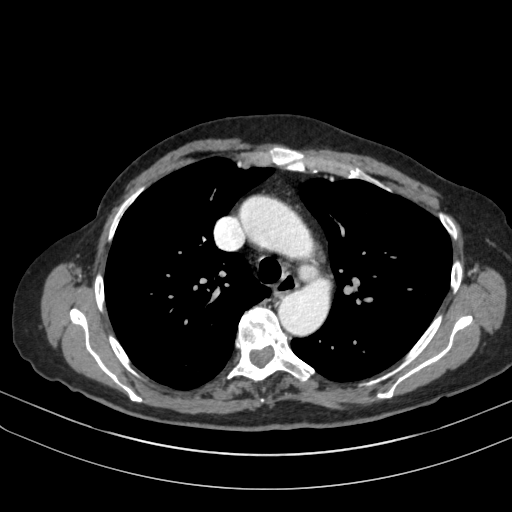
[im 151/214  soft-tissue]
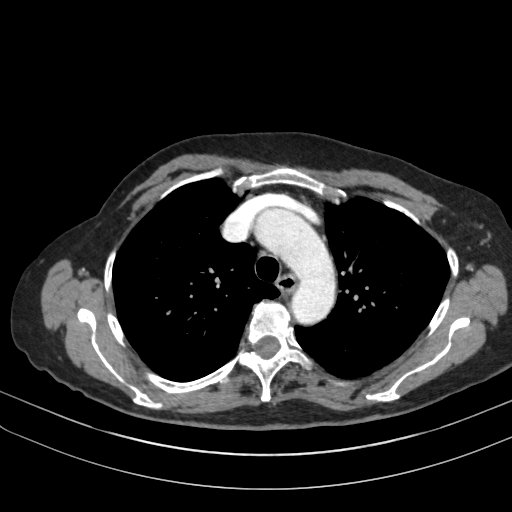
[im 151/214  bone]
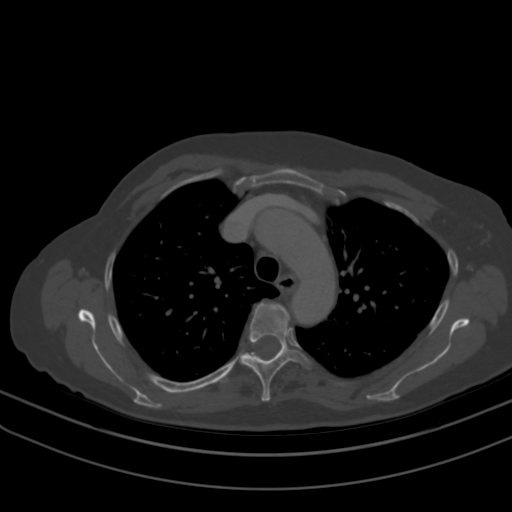
[im 163/214  soft-tissue]
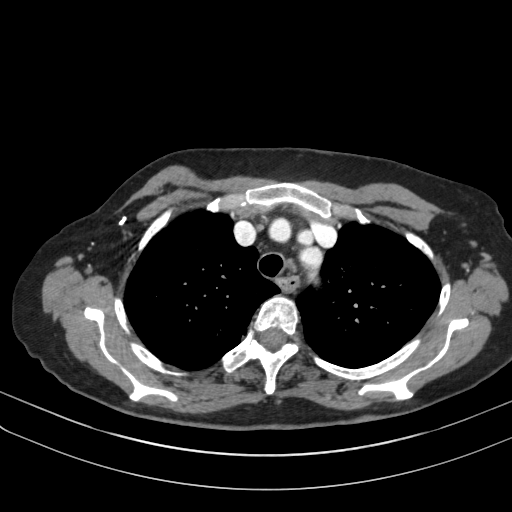
[im 188/214  soft-tissue]
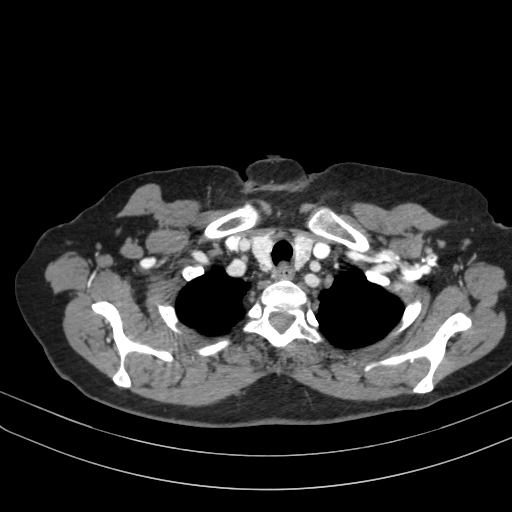
[im 201/214  soft-tissue]
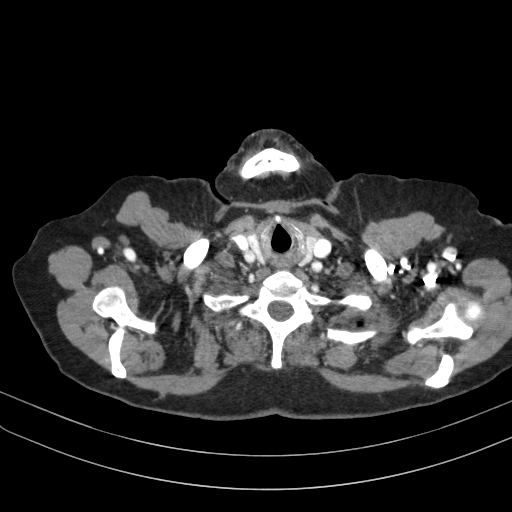

[15 of 46 positions shown; findings below may reference images not displayed]

FINDINGS: CT CHEST

Mediastinum/Nodes: Bilateral axillary node dissection. No enlarged
axillary, internal mammary, mediastinal or hilar lymph nodes. The
thyroid gland, trachea and esophagus demonstrate no significant
findings. The heart size is normal. There is no pericardial
effusion. There are no significant vascular findings.

Lungs/Pleura: There is no pleural effusion. Scattered small
subpleural nodules bilaterally are unchanged. The largest measures 4
mm in the right lower lobe on image number 28. There are no new or
enlarging pulmonary nodules. There is no airspace disease or
endobronchial lesion.

Musculoskeletal/Chest wall: Bilateral mastectomy. No chest wall mass
or suspicious osseous finding. There is a thoracolumbar scoliosis
with mild spondylosis.

CT ABDOMEN FINDINGS

Hepatobiliary: Multiple well-circumscribed low-density hepatic
lesions are grossly stable, most consistent with cysts. No new or
enlarging lesions are identified. No evidence of gallstones,
gallbladder wall thickening or biliary dilatation.

Pancreas: Unremarkable. No pancreatic ductal dilatation or
surrounding inflammatory changes.

Spleen: Normal in size without focal abnormality.

Adrenals/Urinary Tract: Both adrenal glands appear normal. The
dominant cyst in the posterior interpolar region of the left kidney
has mildly enlarged compared with the most recent abdominal CT,
measuring 6.2 x 6.1 cm (previously 5.8 x 6.0 cm). This cyst appears
simple without enhancement or thickened septations. There is
additional smaller simple cyst in the mid left kidney. The right
kidney appears normal. No evidence of urinary tract calculus or
hydronephrosis. Bladder not imaged.

Stomach/Bowel: No evidence of bowel wall thickening, distention or
surrounding inflammatory change.

Vascular/Lymphatic: There are no enlarged abdominal or pelvic lymph
nodes. Scattered small retroperitoneal lymph nodes are stable. No
significant vascular findings.

Other: There is no ascites or peritoneal nodularity.

Musculoskeletal: There is a thoracolumbar scoliosis with lower
lumbar spondylosis and facet hypertrophy, grossly stable. There is a
probable extruded disc fragment containing vacuum phenomenon in the
left L4-5 foramen which was not previously imaged.
IMPRESSION: 1. No evidence of metastatic breast cancer.
2. Stable scattered small pulmonary nodules consistent with benign
findings based on stability.
3. Stable hepatic cysts.
4. Simple left renal cysts. The largest lesion has mildly enlarged
compared with the most recent study, although demonstrates no
worrisome features.
5. Thoracolumbar scoliosis and spondylosis. Probable left-sided disc
extrusion at L4-5.

## 2015-09-30 MED ORDER — IOPAMIDOL (ISOVUE-300) INJECTION 61%
100.0000 mL | Freq: Once | INTRAVENOUS | Status: AC | PRN
  Administered 2015-09-30: 100 mL via INTRAVENOUS

## 2015-11-01 DIAGNOSIS — I872 Venous insufficiency (chronic) (peripheral): Secondary | ICD-10-CM | POA: Diagnosis not present

## 2015-11-01 DIAGNOSIS — I739 Peripheral vascular disease, unspecified: Secondary | ICD-10-CM | POA: Diagnosis not present

## 2015-11-01 DIAGNOSIS — M7989 Other specified soft tissue disorders: Secondary | ICD-10-CM | POA: Diagnosis not present

## 2015-11-01 DIAGNOSIS — I8312 Varicose veins of left lower extremity with inflammation: Secondary | ICD-10-CM | POA: Diagnosis not present

## 2015-11-01 DIAGNOSIS — I8311 Varicose veins of right lower extremity with inflammation: Secondary | ICD-10-CM | POA: Diagnosis not present

## 2015-11-01 DIAGNOSIS — M79609 Pain in unspecified limb: Secondary | ICD-10-CM | POA: Diagnosis not present

## 2015-11-15 DIAGNOSIS — Z79899 Other long term (current) drug therapy: Secondary | ICD-10-CM | POA: Diagnosis not present

## 2015-11-24 DIAGNOSIS — X32XXXD Exposure to sunlight, subsequent encounter: Secondary | ICD-10-CM | POA: Diagnosis not present

## 2015-11-24 DIAGNOSIS — L820 Inflamed seborrheic keratosis: Secondary | ICD-10-CM | POA: Diagnosis not present

## 2015-11-24 DIAGNOSIS — Z08 Encounter for follow-up examination after completed treatment for malignant neoplasm: Secondary | ICD-10-CM | POA: Diagnosis not present

## 2015-11-24 DIAGNOSIS — L570 Actinic keratosis: Secondary | ICD-10-CM | POA: Diagnosis not present

## 2015-11-24 DIAGNOSIS — Z85828 Personal history of other malignant neoplasm of skin: Secondary | ICD-10-CM | POA: Diagnosis not present

## 2015-12-02 DIAGNOSIS — M79609 Pain in unspecified limb: Secondary | ICD-10-CM | POA: Diagnosis not present

## 2015-12-02 DIAGNOSIS — M7989 Other specified soft tissue disorders: Secondary | ICD-10-CM | POA: Diagnosis not present

## 2015-12-02 DIAGNOSIS — I739 Peripheral vascular disease, unspecified: Secondary | ICD-10-CM | POA: Diagnosis not present

## 2015-12-02 DIAGNOSIS — I8312 Varicose veins of left lower extremity with inflammation: Secondary | ICD-10-CM | POA: Diagnosis not present

## 2015-12-02 DIAGNOSIS — I8311 Varicose veins of right lower extremity with inflammation: Secondary | ICD-10-CM | POA: Diagnosis not present

## 2015-12-02 DIAGNOSIS — I872 Venous insufficiency (chronic) (peripheral): Secondary | ICD-10-CM | POA: Diagnosis not present

## 2016-02-16 DIAGNOSIS — H5213 Myopia, bilateral: Secondary | ICD-10-CM | POA: Diagnosis not present

## 2016-03-09 ENCOUNTER — Ambulatory Visit (INDEPENDENT_AMBULATORY_CARE_PROVIDER_SITE_OTHER): Payer: Medicare Other | Admitting: Vascular Surgery

## 2016-03-20 ENCOUNTER — Encounter (INDEPENDENT_AMBULATORY_CARE_PROVIDER_SITE_OTHER): Payer: Self-pay | Admitting: Vascular Surgery

## 2016-03-20 ENCOUNTER — Ambulatory Visit (INDEPENDENT_AMBULATORY_CARE_PROVIDER_SITE_OTHER): Payer: Medicare Other | Admitting: Vascular Surgery

## 2016-03-20 VITALS — BP 119/64 | HR 70 | Resp 16 | Ht 65.0 in | Wt 151.0 lb

## 2016-03-20 DIAGNOSIS — M7989 Other specified soft tissue disorders: Secondary | ICD-10-CM | POA: Diagnosis not present

## 2016-03-20 DIAGNOSIS — I83813 Varicose veins of bilateral lower extremities with pain: Secondary | ICD-10-CM

## 2016-03-20 DIAGNOSIS — M79604 Pain in right leg: Secondary | ICD-10-CM | POA: Diagnosis not present

## 2016-03-20 DIAGNOSIS — I872 Venous insufficiency (chronic) (peripheral): Secondary | ICD-10-CM

## 2016-04-02 NOTE — Progress Notes (Signed)
[facility] Admission History & Physical  MRN : [id number]  Diane Ingram is a 70 y.o. (Nov 23, 1945) female who presents with chief complaint of  Chief Complaint  Patient presents with  . Re-evaluation    3 month no studies  .  History of Present Illness: The patient returns for followup evaluation 3 months after the initial visit. The patient continues to have pain in the lower extremities with dependency. The pain is lessened with elevation. Graduated compression stockings, Class I (20-30 mmHg), have been worn but the stockings do not eliminate the leg pain. Over-the-counter analgesics do not improve the symptoms. The degree of discomfort continues to interfere with daily activities. The patient notes the pain in the legs is causing problems with daily exercise, at the workplace and even with household activities and maintenance such as standing in the kitchen preparing meals and doing dishes.   Venous ultrasound shows normal deep venous system, no evidence of acute or chronic DVT.  Superficial reflux is present in the GSV  Current Meds  Medication Sig  . atorvastatin (LIPITOR) 20 MG tablet Once daily  . raloxifene (EVISTA) 60 MG tablet TAKE 1 TABLET (60 MG TOTAL) BY MOUTH DAILY.  Marland Kitchen UNABLE TO FIND Rx: [id number] Post Surgical Bras (Quantity: 6) [id number]- Non-Silicone Breast Prosthesis (Quantity: 1) Dx: 174.9; Right mastectomy    Past Medical History:  Diagnosis Date  . Cancer [facility]) April 2003   right Breast  . Hyperlipidemia   . Leg pain, bilateral   . Varicose veins     Past Surgical History:  Procedure Laterality Date  . ABDOMINAL HYSTERECTOMY  2002  . MASTECTOMY  09/18/01   bilateral    Social History Social History  Substance Use Topics  . Smoking status: Never Smoker  . Smokeless tobacco: Never Used  . Alcohol use No    Family History Family History  Problem Relation Age of Onset  . Cancer Mother     breast  . Cancer Father     melanoma     No Known Allergies   REVIEW OF SYSTEMS (Negative unless checked)  Constitutional: [] Weight loss  [] Fever  [] Chills Cardiac: [] Chest pain   [] Chest pressure   [] Palpitations   [] Shortness of breath when laying flat   [] Shortness of breath with exertion. Vascular:  [] Pain in legs with walking   [] Pain in legs at rest  [] History of DVT   [] Phlebitis   [x] Swelling in legs   [x] Varicose veins   [] Non-healing ulcers Pulmonary:   [] Uses home oxygen   [] Productive cough   [] Hemoptysis   [] Wheeze  [] COPD   [] Asthma Neurologic:  [] Dizziness   [] Seizures   [] History of stroke   [] History of TIA  [] Aphasia   [] Vissual changes   [] Weakness or numbness in arm   [] Weakness or numbness in leg Musculoskeletal:   [] Joint swelling   [] Joint pain   [] Low back pain Hematologic:  [] Easy bruising  [] Easy bleeding   [] Hypercoagulable state   [] Anemic Gastrointestinal:  [] Diarrhea   [] Vomiting  [] Gastroesophageal reflux/heartburn   [] Difficulty swallowing. Genitourinary:  [] Chronic kidney disease   [] Difficult urination  [] Frequent urination   [] Blood in urine Skin:  [] Rashes   [] Ulcers  Psychological:  [] History of anxiety   []  History of major depression.  Physical Examination  Vitals:   03/20/16 1115  BP: 119/64  Pulse: 70  Resp: 16  Weight: 151 lb (68.5 kg)  Height: 5\' 5"  (1.651 m)   Body mass index is  25.13 kg/m. Gen: WD/WN, NAD Head: [place]/AT, No temporalis wasting.  Ear/Nose/Throat: Hearing grossly intact, nares w/o erythema or drainage, poor dentition Eyes: PER, EOMI, sclera nonicteric.  Neck: Supple, no masses.  No bruit or JVD.  Pulmonary:  Good air movement, clear to auscultation bilaterally, no use of accessory muscles.  Cardiac: RRR, normal S1, S2, no Murmurs. Vascular: large varicosities Vessel Right Left  Radial Palpable Palpable  Ulnar Palpable Palpable  Brachial Palpable Palpable  Carotid Palpable Palpable  Femoral Palpable Palpable  Popliteal Palpable Palpable  PT Palpable  Palpable  DP Palpable Palpable   Gastrointestinal: soft, non-distended. No guarding/no peritoneal signs.  Musculoskeletal: M/S 5/5 throughout.  No deformity or atrophy.  Neurologic: CN 2-12 intact. Pain and light touch intact in extremities.  Symmetrical.  Speech is fluent. Motor exam as listed above. Psychiatric: Judgment intact, Mood & affect appropriate for pt's clinical situation. Dermatologic: No rashes or ulcers noted.  No changes consistent with cellulitis. Lymph : No Cervical lymphadenopathy, no lichenification or skin changes of chronic lymphedema.  CBC Lab Results  Component Value Date   WBC 5.6 07/23/2008   HGB 13.5 07/23/2008   HCT 40.2 07/23/2008   MCV 88.9 07/23/2008   PLT 279 07/23/2008    BMET    Component Value Date/Time   NA 141 04/24/2012 0944   K 3.8 04/24/2012 0944   CL 108 04/24/2012 0944   CO2 26 04/24/2012 0944   GLUCOSE 103 (H) 04/24/2012 0944   GLUCOSE 97 03/08/2006 1252   BUN 15 04/24/2012 0944   CREATININE 0.7 04/24/2012 0944   CALCIUM 8.6 04/24/2012 0944   GFRNONAA 86.44 04/07/2010 1339   GFRAA 130 07/23/2008 0917   CrCl cannot be calculated (Patient's most recent lab result is older than the maximum 21 days allowed.).  COAG No results found for: INR, PROTIME  Radiology No results found.  Assessment/Plan 1. Chronic venous insufficiency Recommend  I have reviewed my previous  discussion with the patient regarding  varicose veins and why they cause symptoms. Patient will continue  wearing graduated compression stockings class 1 on a daily basis, beginning first thing in the morning and removing them in the evening.    In addition, behavioral modification including elevation during the day was again discussed and this will continue.  The patient has utilized over the counter pain medications and has been exercising.  However, at this time conservative therapy has not alleviated the patient's symptoms of leg pain and swelling  Recommend:  laser ablation of the right great saphenous veins to eliminate the symptoms of pain and swelling of the lower extremities caused by the severe superficial venous reflux disease.   2. Swelling of limb See #1  3. Pain of right lower extremity See # 1  4. Varicose veins of both lower extremities with pain See #1  Hortencia Pilar, MD  04/02/2016 8:21 PM

## 2016-04-15 DIAGNOSIS — J0100 Acute maxillary sinusitis, unspecified: Secondary | ICD-10-CM | POA: Diagnosis not present

## 2016-05-25 ENCOUNTER — Encounter (INDEPENDENT_AMBULATORY_CARE_PROVIDER_SITE_OTHER): Payer: Self-pay | Admitting: Vascular Surgery

## 2016-05-25 ENCOUNTER — Ambulatory Visit (INDEPENDENT_AMBULATORY_CARE_PROVIDER_SITE_OTHER): Payer: Medicare Other | Admitting: Vascular Surgery

## 2016-05-25 VITALS — BP 112/69 | HR 73 | Resp 16 | Ht 64.0 in | Wt 152.0 lb

## 2016-05-25 DIAGNOSIS — I83813 Varicose veins of bilateral lower extremities with pain: Secondary | ICD-10-CM

## 2016-05-25 DIAGNOSIS — I872 Venous insufficiency (chronic) (peripheral): Secondary | ICD-10-CM

## 2016-05-25 DIAGNOSIS — M79604 Pain in right leg: Secondary | ICD-10-CM

## 2016-05-25 NOTE — Progress Notes (Signed)
    MRN : [id number]  KILAH LAURO is a 71 y.o. ([birth year]) female who presents with chief complaint of  Chief Complaint  Patient presents with  . Varicose Veins    Right Leg GSV laser ablation  .    The patient's right lower extremity was sterilely prepped and draped.  The ultrasound machine was used to visualize the right great saphenous vein throughout its course.  A segment below the knee was selected for access.  The saphenous vein was accessed without difficulty using ultrasound guidance with a micropuncture needle.   An 0.018  wire was placed beyond the saphenofemoral junction through the sheath and the microneedle was removed.  The 65 cm sheath was then placed over the wire and the wire and dilator were removed.  The laser fiber was placed through the sheath and its tip was placed approximately 2 cm below the saphenofemoral junction.  Tumescent anesthesia was then created with a dilute lidocaine solution.  Laser energy was then delivered with constant withdrawal of the sheath and laser fiber.  Approximately 1669 Joules of energy were delivered over a length of 50 cm.  Sterile dressings were placed.  The patient tolerated the procedure well without complications.

## 2016-05-29 ENCOUNTER — Encounter (INDEPENDENT_AMBULATORY_CARE_PROVIDER_SITE_OTHER): Payer: Medicare Other

## 2016-05-31 ENCOUNTER — Ambulatory Visit (INDEPENDENT_AMBULATORY_CARE_PROVIDER_SITE_OTHER): Payer: Medicare Other

## 2016-05-31 DIAGNOSIS — I83813 Varicose veins of bilateral lower extremities with pain: Secondary | ICD-10-CM

## 2016-06-03 DIAGNOSIS — R6889 Other general symptoms and signs: Secondary | ICD-10-CM | POA: Diagnosis not present

## 2016-06-15 DIAGNOSIS — R05 Cough: Secondary | ICD-10-CM | POA: Diagnosis not present

## 2016-06-15 DIAGNOSIS — J019 Acute sinusitis, unspecified: Secondary | ICD-10-CM | POA: Diagnosis not present

## 2016-06-15 DIAGNOSIS — J069 Acute upper respiratory infection, unspecified: Secondary | ICD-10-CM | POA: Diagnosis not present

## 2016-06-22 ENCOUNTER — Ambulatory Visit (INDEPENDENT_AMBULATORY_CARE_PROVIDER_SITE_OTHER): Payer: Medicare Other | Admitting: Vascular Surgery

## 2016-06-22 ENCOUNTER — Encounter (INDEPENDENT_AMBULATORY_CARE_PROVIDER_SITE_OTHER): Payer: Self-pay | Admitting: Vascular Surgery

## 2016-06-22 VITALS — BP 106/73 | HR 78 | Resp 16 | Ht 65.0 in | Wt 150.0 lb

## 2016-06-22 DIAGNOSIS — I872 Venous insufficiency (chronic) (peripheral): Secondary | ICD-10-CM

## 2016-06-22 DIAGNOSIS — M79604 Pain in right leg: Secondary | ICD-10-CM | POA: Diagnosis not present

## 2016-06-22 DIAGNOSIS — E782 Mixed hyperlipidemia: Secondary | ICD-10-CM | POA: Diagnosis not present

## 2016-06-22 DIAGNOSIS — I83813 Varicose veins of bilateral lower extremities with pain: Secondary | ICD-10-CM | POA: Diagnosis not present

## 2016-06-22 NOTE — Progress Notes (Signed)
MRN : [id number]  Diane Ingram is a 71 y.o. (02-21-1946) female who presents with chief complaint of  Chief Complaint  Patient presents with  . Follow-up  .  History of Present Illness: The patient returns to the office for followup status post laser ablation of the right saphenous vein on 05/25/2016.  The patient note significant improvement in the lower extremity pain but not resolution of the symptoms. The patient notes multiple residual varicosities bilaterally which continued to hurt with dependent positions and remained tender to palpation. The patient's swelling is minimally from preoperative status. The patient continues to wear graduated compression stockings on a daily basis but these are not eliminating the pain and discomfort. The patient continues to use over-the-counter anti-inflammatory medications to treat the pain and related symptoms but this has not given the patient relief. The patient notes the pain in the lower extremities is causing problems with daily exercise, problems at work and even with household activities such as preparing meals and doing dishes.  The patient is otherwise done well and there have been no complications related to the laser procedure or interval changes in the patient's overall   Post laser ultrasound shows successful ablation of the right great saphenous vein  Current Meds  Medication Sig  . atorvastatin (LIPITOR) 20 MG tablet Once daily  . raloxifene (EVISTA) 60 MG tablet TAKE 1 TABLET (60 MG TOTAL) BY MOUTH DAILY.    Past Medical History:  Diagnosis Date  . Cancer [facility]) April 2003   right Breast  . Hyperlipidemia   . Leg pain, bilateral   . Varicose veins     Past Surgical History:  Procedure Laterality Date  . ABDOMINAL HYSTERECTOMY  2002  . MASTECTOMY  09/18/01   bilateral    Social History Social History  Substance Use Topics  . Smoking status: Never Smoker  . Smokeless tobacco: Never Used  . Alcohol use No    Family  History Family History  Problem Relation Age of Onset  . Cancer Mother     breast  . Cancer Father     melanoma  No family history of bleeding/clotting disorders, porphyria or autoimmune disease  No Known Allergies   REVIEW OF SYSTEMS (Negative unless checked)  Constitutional: [] Weight loss  [] Fever  [] Chills Cardiac: [] Chest pain   [] Chest pressure   [] Palpitations   [] Shortness of breath when laying flat   [] Shortness of breath with exertion. Vascular:  [] Pain in legs with walking   [x] Pain in legs with standing  [] History of DVT   [] Phlebitis   [x] Swelling in legs   [x] Varicose veins   [] Non-healing ulcers Pulmonary:   [] Uses home oxygen   [] Productive cough   [] Hemoptysis   [] Wheeze  [] COPD   [] Asthma Neurologic:  [] Dizziness   [] Seizures   [] History of stroke   [] History of TIA  [] Aphasia   [] Vissual changes   [] Weakness or numbness in arm   [] Weakness or numbness in leg Musculoskeletal:   [] Joint swelling   [] Joint pain   [] Low back pain Hematologic:  [] Easy bruising  [] Easy bleeding   [] Hypercoagulable state   [] Anemic Gastrointestinal:  [] Diarrhea   [] Vomiting  [] Gastroesophageal reflux/heartburn   [] Difficulty swallowing. Genitourinary:  [] Chronic kidney disease   [] Difficult urination  [] Frequent urination   [] Blood in urine Skin:  [] Rashes   [] Ulcers  Psychological:  [] History of anxiety   []  History of major depression.  Physical Examination  Vitals:   06/22/16 0835  BP: 106/73  Pulse: 78  Resp: 16  Weight: 150 lb (68 kg)  Height: 5\' 5"  (1.651 m)   Body mass index is 24.96 kg/m. Gen: WD/WN, NAD Head: [place]/AT, No temporalis wasting.  Ear/Nose/Throat: Hearing grossly intact, nares w/o erythema or drainage, poor dentition Eyes: PER, EOMI, sclera nonicteric.  Neck: Supple, no masses.  No bruit or JVD.  Pulmonary:  Good air movement, clear to auscultation bilaterally, no use of accessory muscles.  Cardiac: RRR, normal S1, S2, no Murmurs. Vascular: multiple large  residual varicose veins of the right leg tender to palpation mild venous stasis skin changes of the right ankle Vessel Right Left  Radial Palpable Palpable  Ulnar Palpable Palpable  Brachial Palpable Palpable  Carotid Palpable Palpable  Femoral Palpable Palpable  Popliteal Palpable Palpable  PT Palpable Palpable  DP Palpable Palpable   Gastrointestinal: soft, non-distended. No guarding/no peritoneal signs.  Musculoskeletal: M/S 5/5 throughout.  No deformity or atrophy.  Neurologic: CN 2-12 intact. Pain and light touch intact in extremities.  Symmetrical.  Speech is fluent. Motor exam as listed above. Psychiatric: Judgment intact, Mood & affect appropriate for pt's clinical situation. Dermatologic: No rashes or ulcers noted.  No changes consistent with cellulitis. Lymph : No Cervical lymphadenopathy, no lichenification or skin changes of chronic lymphedema.  CBC Lab Results  Component Value Date   WBC 5.6 07/23/2008   HGB 13.5 07/23/2008   HCT 40.2 07/23/2008   MCV 88.9 07/23/2008   PLT 279 07/23/2008    BMET    Component Value Date/Time   NA 141 04/24/2012 0944   K 3.8 04/24/2012 0944   CL 108 04/24/2012 0944   CO2 26 04/24/2012 0944   GLUCOSE 103 (H) 04/24/2012 0944   GLUCOSE 97 03/08/2006 1252   BUN 15 04/24/2012 0944   CREATININE 0.7 04/24/2012 0944   CALCIUM 8.6 04/24/2012 0944   GFRNONAA 86.44 04/07/2010 1339   GFRAA 130 07/23/2008 0917   CrCl cannot be calculated (Patient's most recent lab result is older than the maximum 21 days allowed.).  COAG No results found for: INR, PROTIME  Radiology No results found.  Assessment/Plan 1. Varicose veins of both lower extremities with pain Recommend:  The patient has had successful ablation of the previously incompetent saphenous venous system but still has persistent symptoms of pain and swelling that are having a negative impact on daily life and daily activities.  Patient should undergo injection sclerotherapy  to treat the residual varicosities.  The risks, benefits and alternative therapies were reviewed in detail with the patient.  All questions were answered.  The patient agrees to proceed with sclerotherapy at their convenience.  The patient will continue wearing the graduated compression stockings and using the over-the-counter pain medications to treat her symptoms.     2. Chronic venous insufficiency No surgery or intervention at this point in time.    I have had a long discussion with the patient regarding venous insufficiency and why it  causes symptoms. I have discussed with the patient the chronic skin changes that accompany venous insufficiency and the long term sequela such as infection and ulceration.  Patient will begin wearing graduated compression stockings class 1 (20-30 mmHg) or compression wraps on a daily basis a prescription was given. The patient will put the stockings on first thing in the morning and removing them in the evening. The patient is instructed specifically not to sleep in the stockings.    In addition, behavioral modification including several periods of elevation of the lower extremities  during the day will be continued. I have demonstrated that proper elevation is a position with the ankles at heart level.  The patient is instructed to begin routine exercise, especially walking on a daily basis  Following the review of the ultrasound the patient will follow up in 2-3 months to reassess the degree of swelling and the control that graduated compression stockings or compression wraps  is offering.   The patient can be assessed for a Lymph Pump at that time  3. Pain of right lower extremity Proceed with sclerotherapy given the tender residual varicose veins  4. Mixed hyperlipidemia Continue statin as ordered and reviewed, no changes at this time   Hortencia Pilar, MD  06/22/2016 9:10 AM

## 2016-07-24 ENCOUNTER — Ambulatory Visit (INDEPENDENT_AMBULATORY_CARE_PROVIDER_SITE_OTHER): Payer: Medicare Other | Admitting: Vascular Surgery

## 2016-07-24 ENCOUNTER — Encounter (INDEPENDENT_AMBULATORY_CARE_PROVIDER_SITE_OTHER): Payer: Self-pay | Admitting: Vascular Surgery

## 2016-07-24 ENCOUNTER — Encounter (INDEPENDENT_AMBULATORY_CARE_PROVIDER_SITE_OTHER): Payer: Self-pay

## 2016-07-24 VITALS — BP 102/65 | HR 91 | Resp 16 | Ht 65.0 in | Wt 154.0 lb

## 2016-07-24 DIAGNOSIS — I83813 Varicose veins of bilateral lower extremities with pain: Secondary | ICD-10-CM

## 2016-07-25 NOTE — Progress Notes (Signed)
Indication:  Patient presents with symptomatic varicose veins of the right lower extremity.  Procedure:  Sclerotherapy using hypertonic saline mixed with 1% Lidocaine was performed on the right lower extremity.  Compression wraps were placed.  The patient tolerated the procedure well.  Plan:  Follow up as needed.   

## 2016-09-12 ENCOUNTER — Encounter (INDEPENDENT_AMBULATORY_CARE_PROVIDER_SITE_OTHER): Payer: Self-pay | Admitting: Vascular Surgery

## 2016-09-12 ENCOUNTER — Ambulatory Visit (INDEPENDENT_AMBULATORY_CARE_PROVIDER_SITE_OTHER): Payer: Medicare Other | Admitting: Vascular Surgery

## 2016-09-12 VITALS — BP 107/66 | HR 70 | Resp 16 | Ht 65.0 in | Wt 156.0 lb

## 2016-09-12 DIAGNOSIS — I872 Venous insufficiency (chronic) (peripheral): Secondary | ICD-10-CM

## 2016-09-12 DIAGNOSIS — I83813 Varicose veins of bilateral lower extremities with pain: Secondary | ICD-10-CM | POA: Diagnosis not present

## 2016-09-12 NOTE — Progress Notes (Signed)
Varicose veins of right  lower extremity with inflammation (454.1  I83.10) Current Plans   Indication: Patient presents with symptomatic varicose veins of the right  lower extremity.   Procedure: Sclerotherapy using hypertonic saline mixed with 1% Lidocaine was performed on the right lower extremity. Compression wraps were placed. The patient tolerated the procedure well. 

## 2016-09-29 DIAGNOSIS — S70361A Insect bite (nonvenomous), right thigh, initial encounter: Secondary | ICD-10-CM | POA: Diagnosis not present

## 2016-09-29 DIAGNOSIS — S70362A Insect bite (nonvenomous), left thigh, initial encounter: Secondary | ICD-10-CM | POA: Diagnosis not present

## 2016-10-04 ENCOUNTER — Encounter: Payer: Self-pay | Admitting: Gastroenterology

## 2016-10-04 ENCOUNTER — Other Ambulatory Visit: Payer: Self-pay | Admitting: Internal Medicine

## 2016-10-04 DIAGNOSIS — N281 Cyst of kidney, acquired: Secondary | ICD-10-CM | POA: Diagnosis not present

## 2016-10-04 DIAGNOSIS — K7689 Other specified diseases of liver: Secondary | ICD-10-CM

## 2016-10-04 DIAGNOSIS — R918 Other nonspecific abnormal finding of lung field: Secondary | ICD-10-CM | POA: Diagnosis not present

## 2016-10-04 DIAGNOSIS — R911 Solitary pulmonary nodule: Secondary | ICD-10-CM

## 2016-10-04 DIAGNOSIS — C50919 Malignant neoplasm of unspecified site of unspecified female breast: Secondary | ICD-10-CM | POA: Diagnosis not present

## 2016-10-05 ENCOUNTER — Other Ambulatory Visit: Payer: Self-pay | Admitting: Internal Medicine

## 2016-10-05 DIAGNOSIS — K7689 Other specified diseases of liver: Secondary | ICD-10-CM

## 2016-10-05 DIAGNOSIS — N281 Cyst of kidney, acquired: Secondary | ICD-10-CM

## 2016-10-05 DIAGNOSIS — R911 Solitary pulmonary nodule: Secondary | ICD-10-CM

## 2016-10-09 ENCOUNTER — Other Ambulatory Visit: Payer: Medicare Other

## 2016-10-27 ENCOUNTER — Ambulatory Visit
Admission: RE | Admit: 2016-10-27 | Discharge: 2016-10-27 | Disposition: A | Discharge status: Home / Self Care | Payer: Medicare Other | Source: Ambulatory Visit | Attending: Internal Medicine | Admitting: Internal Medicine

## 2016-10-27 ENCOUNTER — Other Ambulatory Visit: Payer: Medicare Other

## 2016-10-27 DIAGNOSIS — N200 Calculus of kidney: Secondary | ICD-10-CM | POA: Diagnosis not present

## 2016-10-27 DIAGNOSIS — R911 Solitary pulmonary nodule: Secondary | ICD-10-CM

## 2016-10-27 DIAGNOSIS — K5730 Diverticulosis of large intestine without perforation or abscess without bleeding: Secondary | ICD-10-CM | POA: Diagnosis not present

## 2016-10-27 DIAGNOSIS — N281 Cyst of kidney, acquired: Secondary | ICD-10-CM

## 2016-10-27 DIAGNOSIS — K7689 Other specified diseases of liver: Secondary | ICD-10-CM

## 2016-10-27 DIAGNOSIS — R918 Other nonspecific abnormal finding of lung field: Secondary | ICD-10-CM | POA: Diagnosis not present

## 2016-10-27 IMAGING — CT CT CHEST W/ CM
4 of 5 series · 10 of 46 positions shown, 14 images · IV contrast (APPLIED)
Comparison: Most recent prior CT scan of the chest, abdomen and
pelvis 09/30/2015 ; prior CT scan 08/13/2014

CLINICAL DATA: 71-year-old female with a remote history of breast
cancer and pulmonary nodules and renal cysts which are followed
Annually.

EXAM:
EXAM
CT CHEST, ABDOMEN, AND PELVIS WITH CONTRAST
TECHNIQUE: TECHNIQUE

[Series 2: chest/abd/pelvis w/cm · axial · 0.75mm/px · z∈[+673,+808]mm · 3 of 203 slices shown]
[im 23/203  soft-tissue]
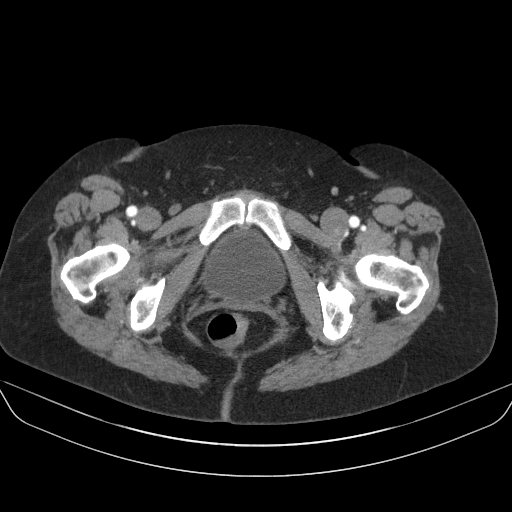
[im 45/203  soft-tissue]
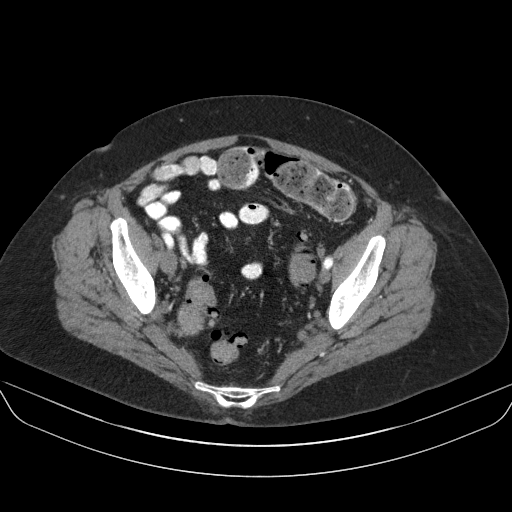
[im 68/203  soft-tissue]
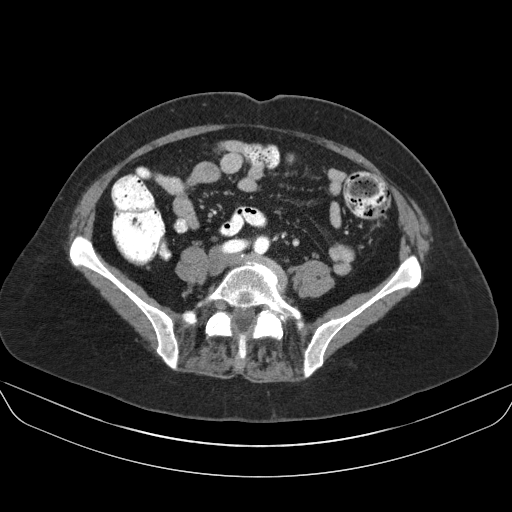

[Series 3: cor · coronal · 0.69mm/px · 3 of 89 slices shown]
[im 30/89  soft-tissue]
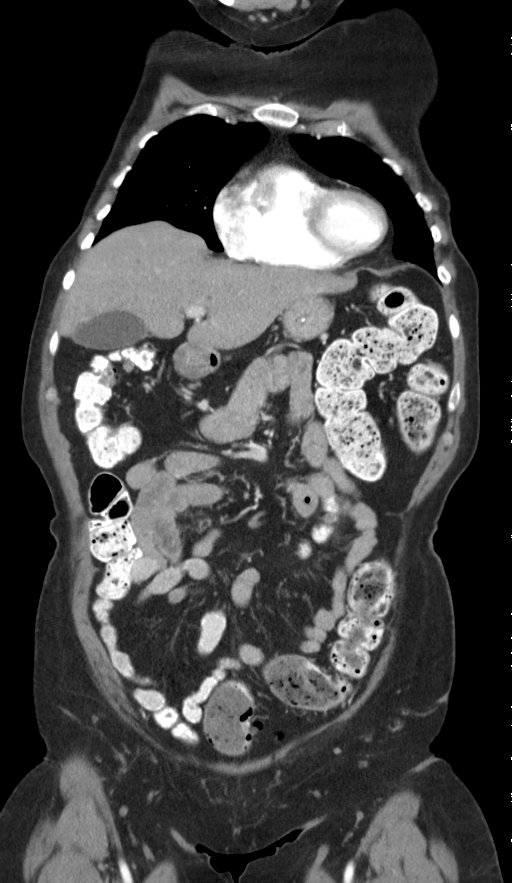
[im 40/89  soft-tissue]
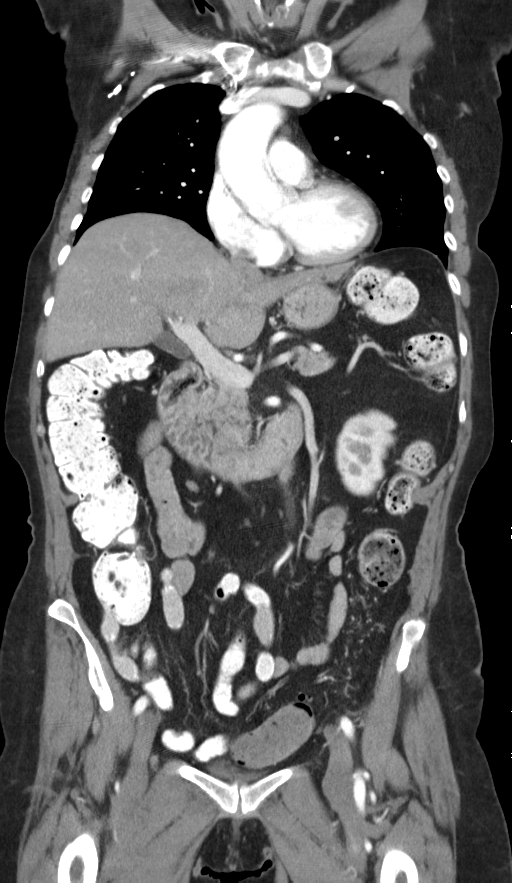
[im 49/89  soft-tissue]
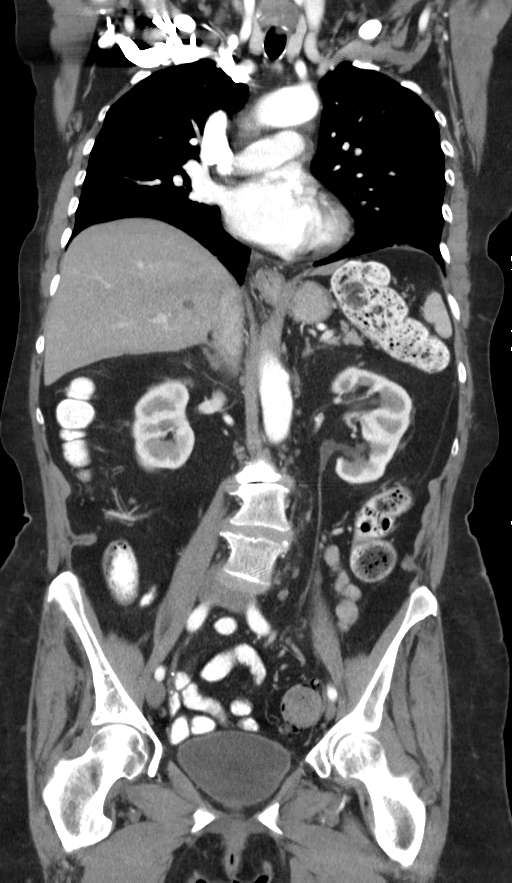

[Series 4: sag · sagittal · 0.57mm/px · 1 of 117 slices shown]
[im 39/117  soft-tissue]
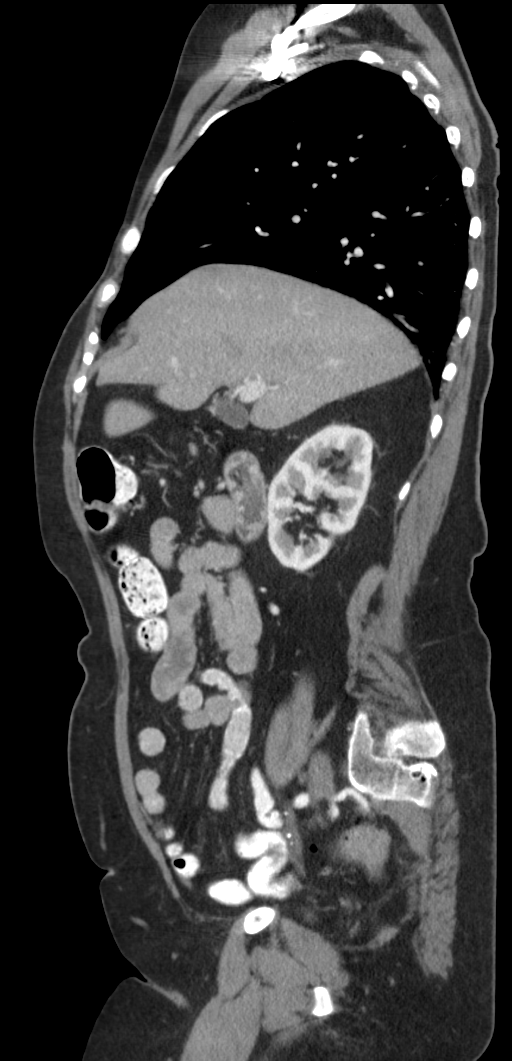

[Series 7: renal delay · axial · delayed · 0.75mm/px · z∈[+833,+1023]mm · 3 of 39 slices shown, 7 images]
[im 1/39  soft-tissue]
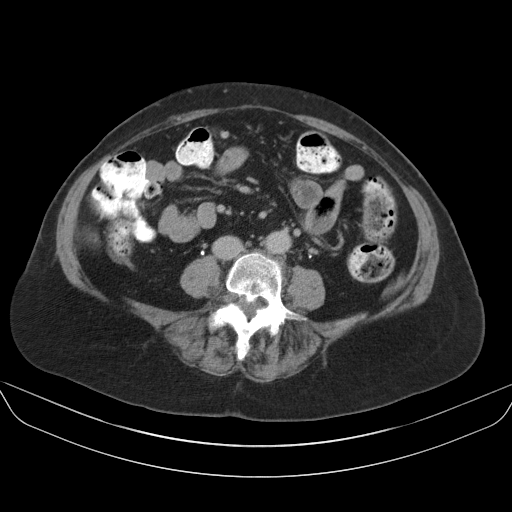
[im 1/39  lung]
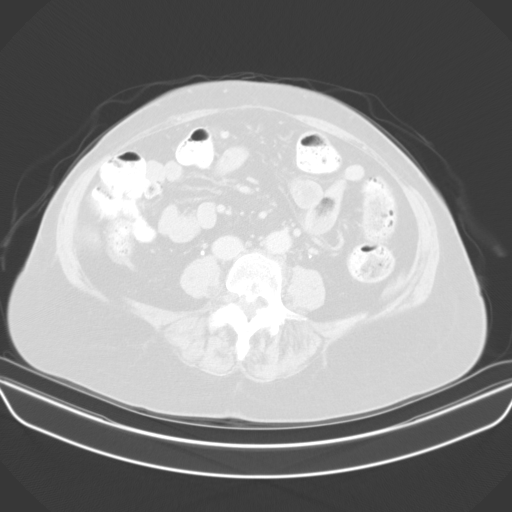
[im 1/39  bone]
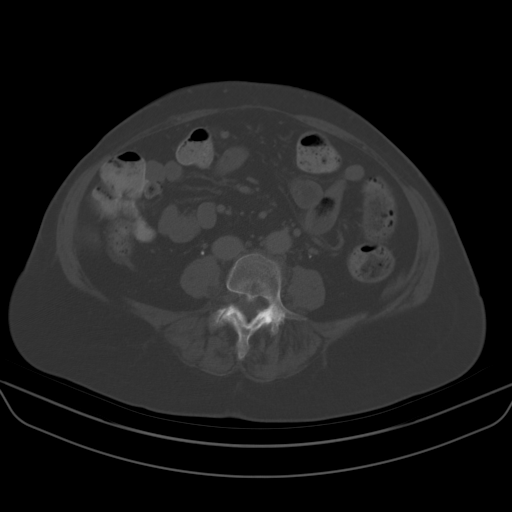
[im 20/39  soft-tissue]
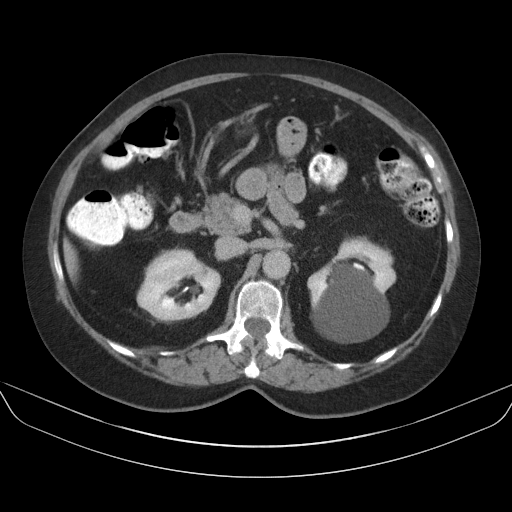
[im 20/39  lung]
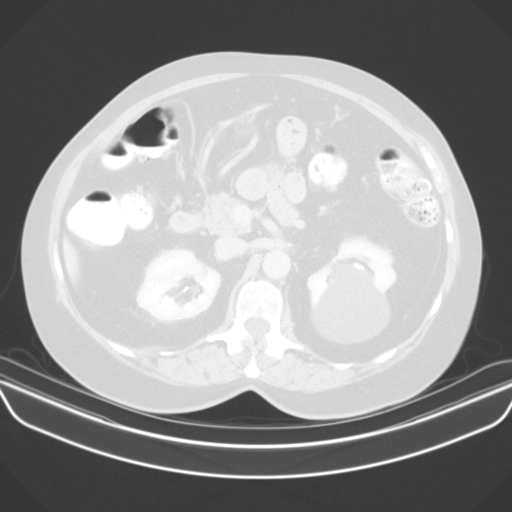
[im 39/39  soft-tissue]
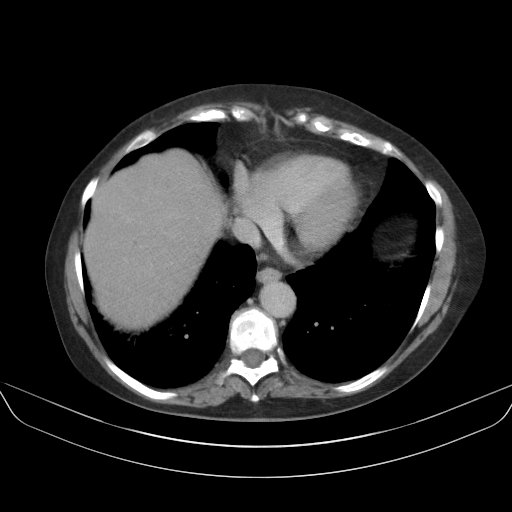
[im 39/39  lung]
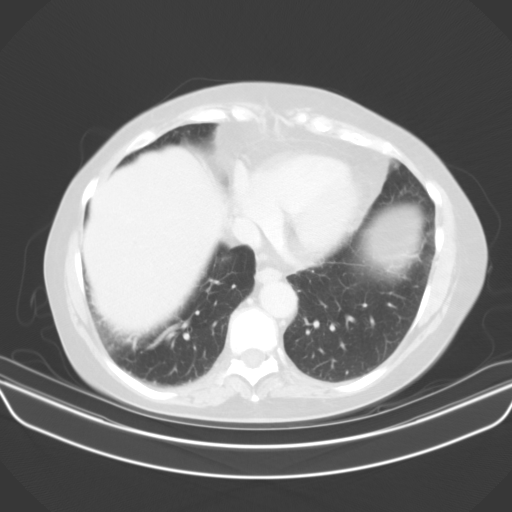

[10 of 46 positions shown; findings below may reference images not displayed]

Multidetector CT imaging of the chest, abdomen and pelvis was
performed following the standard protocol during bolus
administration of intravenous contrast.

Creatinine was obtained on site at [HOSPITAL] at [HOSPITAL].

Results: Creatinine 0.8 mg/dL.

CONTRAST:  100mL 1KM6XH-ZII IOPAMIDOL (1KM6XH-ZII) INJECTION 61%
FINDINGS: FINDINGS

CT CHEST FINDINGS

Cardiovascular: The cardiac structures are normal in size.
Conventional 3 vessel aortic arch. No aneurysm or dissection. No
pericardial effusion. Unremarkable main and central pulmonary
arteries.

Mediastinum/Nodes: Unremarkable CT appearance of the thyroid gland.
No suspicious mediastinal or hilar adenopathy. No soft tissue
mediastinal mass. The thoracic esophagus is unremarkable.

Lungs/Pleura: Numerous bilateral pulmonary nodules ranging in size
from 2-4 mm. No significant interval change in the size, number or
configuration of the pulmonary nodules dating back to at least
Saturday June, 2013. Greater than 2 year stability is consistent with
benignity.

Musculoskeletal: No acute fracture or aggressive appearing lytic or
blastic osseous lesion. Surgical changes of prior bilateral
mastectomy and right axillary nodal dissection. No suspicious
lymphadenopathy.

CT ABDOMEN PELVIS FINDINGS

Hepatobiliary: Stable somewhat ill-defined 1.6 cm low-attenuation
lesion in the medial aspect of hepatic segment 7 dating back to at
least Saturday June, 2013. Greater than 2 year stability is consistent
with benignity. Additional circumscribed low-attenuation lesions
scattered throughout the liver also remain unchanged and are more
consistent with simple cysts. No new or enhancing hepatic lesion
identified. Gallbladder is unremarkable. No intra or extrahepatic
biliary ductal dilatation. Portal veins are patent.

Pancreas: Unremarkable. No pancreatic ductal dilatation or
surrounding inflammatory changes.

Spleen: Normal in size without focal abnormality.

Adrenals/Urinary Tract: Bilateral adrenal glands are normal. The
right kidney is normal. No significant interval change in the simple
large left upper pole cyst compared to the most recent prior
imaging. There has been very slow growth dating back to 1707. The
maximal cyst diameter today is 6.9 cm compared to 5.9 cm previously
peer no new septations or evidence of enhancement. A smaller 1.4 cm
cyst in the interpolar kidney remains stable dating back to 1707 and
is also benign. No enhancing renal mass, hydronephrosis or
nephrolithiasis.

Stomach/Bowel: Colonic diverticular disease without CT evidence of
active inflammation. No evidence of obstruction or focal bowel wall
thickening. Normal appendix in the right lower quadrant. The
terminal ileum is unremarkable.

Vascular/Lymphatic: No significant vascular findings are present. No
enlarged abdominal or pelvic lymph nodes.

Reproductive: Surgical changes of prior hysterectomy. No adnexal
masses.

Other: No abdominal wall hernia or abnormality. No abdominopelvic
ascites.

Musculoskeletal: No acute fracture or aggressive appearing lytic or
blastic osseous lesion. Multilevel degenerative disc disease most
significant at L2-L3 and L4-L5. There is associated mild levoconvex
lumbar scoliosis.
IMPRESSION: IMPRESSION
CT CHEST

1. Greater than 2 year stability of multiple small bilateral
pulmonary nodules consistent with benignity. No further follow-up is
required.
2. Surgical changes of prior bilateral mastectomy and right axillary
nodal dissection. No evidence of re- current or metastatic disease.

CT ABD/PELVIS

1. Benign hepatic and renal cysts with no significant interval
change compared to Sunday September, 2015. The largest left renal cyst
demonstrates slow growth over time dating back to 1707 which is not
unexpected.
2. Extensive sigmoid colonic diverticulosis. No evidence of active
diverticulitis.
3. Levoconvex lumbar scoliosis with multilevel degenerative disc
disease.

## 2016-10-27 MED ORDER — IOPAMIDOL (ISOVUE-300) INJECTION 61%
100.0000 mL | Freq: Once | INTRAVENOUS | Status: AC | PRN
  Administered 2016-10-27: 100 mL via INTRAVENOUS

## 2016-11-01 DIAGNOSIS — E785 Hyperlipidemia, unspecified: Secondary | ICD-10-CM | POA: Diagnosis not present

## 2016-11-01 DIAGNOSIS — M859 Disorder of bone density and structure, unspecified: Secondary | ICD-10-CM | POA: Diagnosis not present

## 2016-11-02 DIAGNOSIS — X32XXXD Exposure to sunlight, subsequent encounter: Secondary | ICD-10-CM | POA: Diagnosis not present

## 2016-11-02 DIAGNOSIS — I871 Compression of vein: Secondary | ICD-10-CM | POA: Diagnosis not present

## 2016-11-02 DIAGNOSIS — L570 Actinic keratosis: Secondary | ICD-10-CM | POA: Diagnosis not present

## 2016-11-02 DIAGNOSIS — B078 Other viral warts: Secondary | ICD-10-CM | POA: Diagnosis not present

## 2016-11-30 DIAGNOSIS — Z1212 Encounter for screening for malignant neoplasm of rectum: Secondary | ICD-10-CM | POA: Diagnosis not present

## 2016-12-04 DIAGNOSIS — N281 Cyst of kidney, acquired: Secondary | ICD-10-CM | POA: Diagnosis not present

## 2016-12-04 DIAGNOSIS — E784 Other hyperlipidemia: Secondary | ICD-10-CM | POA: Diagnosis not present

## 2016-12-04 DIAGNOSIS — Z0000 Encounter for general adult medical examination without abnormal findings: Secondary | ICD-10-CM | POA: Diagnosis not present

## 2016-12-04 DIAGNOSIS — Z23 Encounter for immunization: Secondary | ICD-10-CM | POA: Diagnosis not present

## 2016-12-04 DIAGNOSIS — R918 Other nonspecific abnormal finding of lung field: Secondary | ICD-10-CM | POA: Diagnosis not present

## 2016-12-04 DIAGNOSIS — Z6825 Body mass index (BMI) 25.0-25.9, adult: Secondary | ICD-10-CM | POA: Diagnosis not present

## 2016-12-04 DIAGNOSIS — K7689 Other specified diseases of liver: Secondary | ICD-10-CM | POA: Diagnosis not present

## 2016-12-05 DIAGNOSIS — Z1283 Encounter for screening for malignant neoplasm of skin: Secondary | ICD-10-CM | POA: Diagnosis not present

## 2016-12-05 DIAGNOSIS — X32XXXD Exposure to sunlight, subsequent encounter: Secondary | ICD-10-CM | POA: Diagnosis not present

## 2016-12-05 DIAGNOSIS — L570 Actinic keratosis: Secondary | ICD-10-CM | POA: Diagnosis not present

## 2016-12-05 DIAGNOSIS — D225 Melanocytic nevi of trunk: Secondary | ICD-10-CM | POA: Diagnosis not present

## 2016-12-06 DIAGNOSIS — C50919 Malignant neoplasm of unspecified site of unspecified female breast: Secondary | ICD-10-CM | POA: Diagnosis not present

## 2016-12-12 ENCOUNTER — Encounter: Payer: Medicare Other | Admitting: Gastroenterology

## 2017-02-16 DIAGNOSIS — H5213 Myopia, bilateral: Secondary | ICD-10-CM | POA: Diagnosis not present

## 2017-03-14 DIAGNOSIS — Z800 Family history of malignant neoplasm of digestive organs: Secondary | ICD-10-CM | POA: Diagnosis not present

## 2017-03-14 DIAGNOSIS — C50911 Malignant neoplasm of unspecified site of right female breast: Secondary | ICD-10-CM | POA: Diagnosis not present

## 2017-03-14 DIAGNOSIS — Z803 Family history of malignant neoplasm of breast: Secondary | ICD-10-CM | POA: Diagnosis not present

## 2017-03-20 DIAGNOSIS — Z23 Encounter for immunization: Secondary | ICD-10-CM | POA: Diagnosis not present

## 2017-05-17 DIAGNOSIS — Z9012 Acquired absence of left breast and nipple: Secondary | ICD-10-CM | POA: Diagnosis not present

## 2017-05-17 DIAGNOSIS — C50911 Malignant neoplasm of unspecified site of right female breast: Secondary | ICD-10-CM | POA: Diagnosis not present

## 2017-06-07 DIAGNOSIS — Z9012 Acquired absence of left breast and nipple: Secondary | ICD-10-CM | POA: Diagnosis not present

## 2017-06-07 DIAGNOSIS — C50911 Malignant neoplasm of unspecified site of right female breast: Secondary | ICD-10-CM | POA: Diagnosis not present

## 2017-06-18 DIAGNOSIS — Z1283 Encounter for screening for malignant neoplasm of skin: Secondary | ICD-10-CM | POA: Diagnosis not present

## 2017-06-18 DIAGNOSIS — X32XXXD Exposure to sunlight, subsequent encounter: Secondary | ICD-10-CM | POA: Diagnosis not present

## 2017-06-18 DIAGNOSIS — L821 Other seborrheic keratosis: Secondary | ICD-10-CM | POA: Diagnosis not present

## 2017-06-18 DIAGNOSIS — L570 Actinic keratosis: Secondary | ICD-10-CM | POA: Diagnosis not present

## 2017-06-25 ENCOUNTER — Ambulatory Visit (INDEPENDENT_AMBULATORY_CARE_PROVIDER_SITE_OTHER): Payer: Medicare Other | Admitting: Vascular Surgery

## 2017-07-02 ENCOUNTER — Encounter (INDEPENDENT_AMBULATORY_CARE_PROVIDER_SITE_OTHER): Payer: Self-pay | Admitting: Vascular Surgery

## 2017-07-02 ENCOUNTER — Ambulatory Visit (INDEPENDENT_AMBULATORY_CARE_PROVIDER_SITE_OTHER): Payer: Medicare Other | Admitting: Vascular Surgery

## 2017-07-02 VITALS — BP 106/66 | HR 75 | Resp 15 | Ht 67.0 in | Wt 154.0 lb

## 2017-07-02 DIAGNOSIS — I872 Venous insufficiency (chronic) (peripheral): Secondary | ICD-10-CM | POA: Diagnosis not present

## 2017-07-02 DIAGNOSIS — E782 Mixed hyperlipidemia: Secondary | ICD-10-CM | POA: Diagnosis not present

## 2017-07-02 DIAGNOSIS — I83813 Varicose veins of bilateral lower extremities with pain: Secondary | ICD-10-CM

## 2017-07-02 DIAGNOSIS — I739 Peripheral vascular disease, unspecified: Secondary | ICD-10-CM

## 2017-07-07 ENCOUNTER — Encounter (INDEPENDENT_AMBULATORY_CARE_PROVIDER_SITE_OTHER): Payer: Self-pay | Admitting: Vascular Surgery

## 2017-07-07 NOTE — Progress Notes (Signed)
MRN : [id number]  Diane Ingram is a 72 y.o. (01-05-1946) female who presents with chief complaint of  Chief Complaint  Patient presents with  . Follow-up    Right foot and ankle discoloration  .  History of Present Illness: The patient is seen for evaluation of painful lower extremities. Patient notes the pain is variable and not always associated with activity.  The pain is somewhat consistent day to day occurring on most days. The patient notes the pain also occurs with standing and routinely seems worse as the day wears on. The pain has been progressive over the past several years. The patient states these symptoms are causing  a profound negative impact on quality of life and daily activities.  The patient denies rest pain or dangling of an extremity off the side of the bed during the night for relief. No open wounds or sores at this time. No history of DVT or phlebitis. No prior interventions or surgeries.  There is a  history of back problems and DJD of the lumbar and sacral spine.    Current Meds  Medication Sig  . atorvastatin (LIPITOR) 20 MG tablet Once daily  . raloxifene (EVISTA) 60 MG tablet TAKE 1 TABLET (60 MG TOTAL) BY MOUTH DAILY.    Past Medical History:  Diagnosis Date  . Cancer [facility]) April 2003   right Breast  . Hyperlipidemia   . Leg pain, bilateral   . Varicose veins     Past Surgical History:  Procedure Laterality Date  . ABDOMINAL HYSTERECTOMY  2002  . MASTECTOMY  09/18/01   bilateral    Social History Social History   Tobacco Use  . Smoking status: Never Smoker  . Smokeless tobacco: Never Used  Substance Use Topics  . Alcohol use: No    Alcohol/week: 0.0 oz  . Drug use: No    Family History Family History  Problem Relation Age of Onset  . Cancer Mother        breast  . Cancer Father        melanoma    No Known Allergies   REVIEW OF SYSTEMS (Negative unless checked)  Constitutional: [] Weight loss  [] Fever   [] Chills Cardiac: [] Chest pain   [] Chest pressure   [] Palpitations   [] Shortness of breath when laying flat   [] Shortness of breath with exertion. Vascular:  [x] Pain in legs with walking   [] Pain in legs at rest  [] History of DVT   [] Phlebitis   [x] Swelling in legs   [] Varicose veins   [] Non-healing ulcers Pulmonary:   [] Uses home oxygen   [] Productive cough   [] Hemoptysis   [] Wheeze  [] COPD   [] Asthma Neurologic:  [] Dizziness   [] Seizures   [] History of stroke   [] History of TIA  [] Aphasia   [] Vissual changes   [] Weakness or numbness in arm   [] Weakness or numbness in leg Musculoskeletal:   [] Joint swelling   [] Joint pain   [] Low back pain Hematologic:  [] Easy bruising  [] Easy bleeding   [] Hypercoagulable state   [] Anemic Gastrointestinal:  [] Diarrhea   [] Vomiting  [] Gastroesophageal reflux/heartburn   [] Difficulty swallowing. Genitourinary:  [] Chronic kidney disease   [] Difficult urination  [] Frequent urination   [] Blood in urine Skin:  [] Rashes   [] Ulcers  Psychological:  [] History of anxiety   []  History of major depression.  Physical Examination  Vitals:   07/02/17 1002  BP: 106/66  Pulse: 75  Resp: 15  Weight: 154 lb (69.9 kg)  Height: 5'  7" (1.702 m)   Body mass index is 24.12 kg/m. Gen: WD/WN, NAD Head: [place]/AT, No temporalis wasting.  Ear/Nose/Throat: Hearing grossly intact, nares w/o erythema or drainage Eyes: PER, EOMI, sclera nonicteric.  Neck: Supple, no large masses.   Pulmonary:  Good air movement, no audible wheezing bilaterally, no use of accessory muscles.  Cardiac: RRR, no JVD Vascular: scattered varicosities present bilaterally.  Mild venous stasis changes to the legs bilaterally.  2+ soft pitting edema Vessel Right Left  Radial Palpable Palpable  PT Not Palpable Not Palpable  DP Not Palpable Not Palpable  Gastrointestinal: Non-distended. No guarding/no peritoneal signs.  Musculoskeletal: M/S 5/5 throughout.  No deformity or atrophy.  Neurologic: CN 2-12  intact. Symmetrical.  Speech is fluent. Motor exam as listed above. Psychiatric: Judgment intact, Mood & affect appropriate for pt's clinical situation. Dermatologic: venous rashes no ulcers noted.  No changes consistent with cellulitis. Lymph : No lichenification or skin changes of chronic lymphedema.  CBC Lab Results  Component Value Date   WBC 5.6 07/23/2008   HGB 13.5 07/23/2008   HCT 40.2 07/23/2008   MCV 88.9 07/23/2008   PLT 279 07/23/2008    BMET    Component Value Date/Time   NA 141 04/24/2012 0944   K 3.8 04/24/2012 0944   CL 108 04/24/2012 0944   CO2 26 04/24/2012 0944   GLUCOSE 103 (H) 04/24/2012 0944   GLUCOSE 97 03/08/2006 1252   BUN 15 04/24/2012 0944   CREATININE 0.7 04/24/2012 0944   CALCIUM 8.6 04/24/2012 0944   GFRNONAA 86.44 04/07/2010 1339   GFRAA 130 07/23/2008 0917   CrCl cannot be calculated (Patient's most recent lab result is older than the maximum 21 days allowed.).  COAG No results found for: INR, PROTIME  Radiology No results found.  Assessment/Plan 1. Chronic venous insufficiency  Recommend:  The patient has atypical pain symptoms for pure atherosclerotic disease. However, on physical exam there is evidence of mixed venous and arterial disease, given the diminished pulses and the edema associated with venous changes of the legs.  Noninvasive studies including ABI's and venous ultrasound of the legs will be obtained and the patient will follow up with me to review these studies.  The patient should continue walking and begin a more formal exercise program. The patient should continue his antiplatelet therapy and aggressive treatment of the lipid abnormalities.  The patient should begin wearing graduated compression socks 15-20 mmHg strength to control edema.  - VAS [place] LOWER EXTREMITY VENOUS REFLUX; Future  2. Varicose veins of both lower extremities with pain  Recommend:  The patient has atypical pain symptoms for pure  atherosclerotic disease. However, on physical exam there is evidence of mixed venous and arterial disease, given the diminished pulses and the edema associated with venous changes of the legs.  Noninvasive studies including ABI's and venous ultrasound of the legs will be obtained and the patient will follow up with me to review these studies.  The patient should continue walking and begin a more formal exercise program. The patient should continue his antiplatelet therapy and aggressive treatment of the lipid abnormalities.  The patient should begin wearing graduated compression socks 15-20 mmHg strength to control edema.   3. PAD (peripheral artery disease) (HCC)  Recommend:  The patient has atypical pain symptoms for pure atherosclerotic disease. However, on physical exam there is evidence of mixed venous and arterial disease, given the diminished pulses and the edema associated with venous changes of the legs.  Noninvasive studies  including ABI's and venous ultrasound of the legs will be obtained and the patient will follow up with me to review these studies.  The patient should continue walking and begin a more formal exercise program. The patient should continue his antiplatelet therapy and aggressive treatment of the lipid abnormalities.  The patient should begin wearing graduated compression socks 15-20 mmHg strength to control edema.  - VAS [place] ABI WITH/WO TBI; Future  4. Mixed hyperlipidemia Continue statin as ordered and reviewed, no changes at this time     Hortencia Pilar, MD  07/07/2017 2:54 PM

## 2017-07-25 ENCOUNTER — Ambulatory Visit (INDEPENDENT_AMBULATORY_CARE_PROVIDER_SITE_OTHER): Payer: Medicare Other

## 2017-07-25 ENCOUNTER — Encounter (INDEPENDENT_AMBULATORY_CARE_PROVIDER_SITE_OTHER): Payer: Self-pay | Admitting: Vascular Surgery

## 2017-07-25 ENCOUNTER — Ambulatory Visit (INDEPENDENT_AMBULATORY_CARE_PROVIDER_SITE_OTHER): Payer: Medicare Other | Admitting: Vascular Surgery

## 2017-07-25 ENCOUNTER — Encounter (INDEPENDENT_AMBULATORY_CARE_PROVIDER_SITE_OTHER): Payer: Self-pay

## 2017-07-25 VITALS — BP 114/70 | HR 68 | Resp 14 | Ht 65.0 in | Wt 156.0 lb

## 2017-07-25 DIAGNOSIS — I890 Lymphedema, not elsewhere classified: Secondary | ICD-10-CM

## 2017-07-25 DIAGNOSIS — I872 Venous insufficiency (chronic) (peripheral): Secondary | ICD-10-CM

## 2017-07-25 NOTE — Progress Notes (Signed)
Subjective:    Patient ID: Diane Ingram, female    DOB: [birth year], 72 y.o.   MRN: [id number] Chief Complaint  Patient presents with  . Follow-up    ABI and reflux f/u   Patient last seen on July 02, 2017 for evaluation of right lower extremity edema and discoloration.  The patient presents today to review vascular studies.  The patient underwent a bilateral ABI which was notable for normal triphasic flow in the bilateral distal tibial arteries suggesting normal arterial perfusion to the legs.  The patient underwent a right lower extremity venous duplex which was notable for a ablated right great saphenous vein.  Reflux was noted in the right common femoral and popliteal veins.  No evidence of deep vein or superficial thrombophlebitis to the right lower extremity.  The patient has not been engaging in conservative therapy by wearing her medical grade one compression stockings and elevating her legs on a daily basis.  The patient denies any fever, nausea vomiting.   Review of Systems  Constitutional: Negative.   HENT: Negative.   Eyes: Negative.   Respiratory: Negative.   Cardiovascular: Positive for leg swelling.  Gastrointestinal: Negative.   Endocrine: Negative.   Genitourinary: Negative.   Musculoskeletal: Negative.   Skin: Negative.   Allergic/Immunologic: Negative.   Neurological: Negative.   Hematological: Negative.   Psychiatric/Behavioral: Negative.       Objective:   Physical Exam  Constitutional: She is oriented to person, place, and time. She appears well-developed and well-nourished. No distress.  HENT:  Head: Normocephalic and atraumatic.  Eyes: Conjunctivae are normal. Pupils are equal, round, and reactive to light.  Neck: Normal range of motion.  Cardiovascular: Normal rate, normal heart sounds and intact distal pulses.  Pulses:      Radial pulses are 2+ on the right side, and 2+ on the left side.       Dorsalis pedis pulses are 2+ on the right side, and  2+ on the left side.       Posterior tibial pulses are 2+ on the right side, and 2+ on the left side.  Pulmonary/Chest: Effort normal and breath sounds normal.  Musculoskeletal: Normal range of motion. She exhibits edema (Minimal to mild edema noted to the right lower extremity).  Neurological: She is alert and oriented to person, place, and time.  Skin: She is not diaphoretic.  Slight bluish discoloration to the patient's heel  Psychiatric: She has a normal mood and affect. Her behavior is normal. Judgment and thought content normal.  Vitals reviewed.  BP 114/70 (BP Location: Right Arm, Patient Position: Sitting)   Pulse 68   Resp 14   Ht 5\' 5"  (1.651 m)   Wt 156 lb (70.8 kg)   BMI 25.96 kg/m   Past Medical History:  Diagnosis Date  . Cancer [facility]) April 2003   right Breast  . Hyperlipidemia   . Leg pain, bilateral   . Varicose veins    Social History   Socioeconomic History  . Marital status: Divorced    Spouse name: Not on file  . Number of children: 2  . Years of education: [employer]  . Highest education level: Not on file  Social Needs  . Financial resource strain: Not on file  . Food insecurity - worry: Not on file  . Food insecurity - inability: Not on file  . Transportation needs - medical: Not on file  . Transportation needs - non-medical: Not on file  Occupational History  .  Occupation: Sports administrator    Employer: [employer]  Tobacco Use  . Smoking status: Never Smoker  . Smokeless tobacco: Never Used  Substance and Sexual Activity  . Alcohol use: No    Alcohol/week: 0.0 oz  . Drug use: No  . Sexual activity: Not on file  Other Topics Concern  . Not on file  Social History Narrative   Pt lives at home alone.   She does use caffeine.   Past Surgical History:  Procedure Laterality Date  . ABDOMINAL HYSTERECTOMY  2002  . MASTECTOMY  09/18/01   bilateral   Family History  Problem Relation Age of Onset  . Cancer Mother        breast  .  Cancer Father        melanoma   No Known Allergies     Assessment & Plan:  Patient last seen on July 02, 2017 for evaluation of right lower extremity edema and discoloration.  The patient presents today to review vascular studies.  The patient underwent a bilateral ABI which was notable for normal triphasic flow in the bilateral distal tibial arteries suggesting normal arterial perfusion to the legs.  The patient underwent a right lower extremity venous duplex which was notable for a ablated right great saphenous vein.  Reflux was noted in the right common femoral and popliteal veins.  No evidence of deep vein or superficial thrombophlebitis to the right lower extremity.  The patient has not been engaging in conservative therapy by wearing her medical grade one compression stockings and elevating her legs on a daily basis.  The patient denies any fever, nausea vomiting.  1. Chronic venous insufficiency - Stable The previous laser ablation to the patient's right greater saphenous vein is ablated on today's duplex The patient has venous reflux noted in the right common femoral and popliteal veins Due to the location in the deep system she is not a candidate for any type of laser ablation or sclerotherapy The patient is upset about this and I had a long conversation  explaining to her that due to the location of the patient's reflux being in the deep system she is not a candidate for laser or sclerotherapy.  2. Lymphedema - Stable The patient was encouraged to wear graduated compression stockings (20-30 mmHg) on a daily basis. The patient was instructed to begin wearing the stockings first thing in the morning and removing them in the evening. The patient was instructed specifically not to sleep in the stockings. Prescription given In addition, behavioral modification including elevation during the day will be initiated. Anti-inflammatories for pain. We discussed moving forward with applying for  a lymphedema pump. At this time, the patient is not interested in the addition of this therapy  Patient is to follow-up as needed  Current Outpatient Medications on File Prior to Visit  Medication Sig Dispense Refill  . atorvastatin (LIPITOR) 20 MG tablet Once daily  2  . predniSONE (STERAPRED UNI-PAK 21 TAB) 10 MG (21) TBPK tablet   0  . raloxifene (EVISTA) 60 MG tablet TAKE 1 TABLET (60 MG TOTAL) BY MOUTH DAILY. 30 tablet 9   No current facility-administered medications on file prior to visit.    There are no Patient Instructions on file for this visit. No Follow-up on file.  Brinn Westby A Davie Claud, PA-C

## 2017-08-02 ENCOUNTER — Encounter (INDEPENDENT_AMBULATORY_CARE_PROVIDER_SITE_OTHER): Payer: Medicare Other

## 2017-08-02 ENCOUNTER — Ambulatory Visit (INDEPENDENT_AMBULATORY_CARE_PROVIDER_SITE_OTHER): Payer: Medicare Other | Admitting: Vascular Surgery

## 2017-08-21 DIAGNOSIS — H5712 Ocular pain, left eye: Secondary | ICD-10-CM | POA: Diagnosis not present

## 2017-10-02 ENCOUNTER — Other Ambulatory Visit: Payer: Self-pay | Admitting: Internal Medicine

## 2017-10-02 DIAGNOSIS — N281 Cyst of kidney, acquired: Secondary | ICD-10-CM

## 2017-10-02 DIAGNOSIS — C50919 Malignant neoplasm of unspecified site of unspecified female breast: Secondary | ICD-10-CM

## 2017-10-02 DIAGNOSIS — K7689 Other specified diseases of liver: Secondary | ICD-10-CM

## 2017-10-02 DIAGNOSIS — R911 Solitary pulmonary nodule: Secondary | ICD-10-CM

## 2017-10-08 ENCOUNTER — Ambulatory Visit
Admission: RE | Admit: 2017-10-08 | Discharge: 2017-10-08 | Disposition: A | Discharge status: Home / Self Care | Payer: Medicare Other | Source: Ambulatory Visit | Attending: Internal Medicine | Admitting: Internal Medicine

## 2017-10-08 ENCOUNTER — Other Ambulatory Visit: Payer: Self-pay | Admitting: Internal Medicine

## 2017-10-08 DIAGNOSIS — C50919 Malignant neoplasm of unspecified site of unspecified female breast: Secondary | ICD-10-CM

## 2017-10-08 DIAGNOSIS — K7689 Other specified diseases of liver: Secondary | ICD-10-CM

## 2017-10-08 DIAGNOSIS — N281 Cyst of kidney, acquired: Secondary | ICD-10-CM

## 2017-10-08 DIAGNOSIS — R911 Solitary pulmonary nodule: Secondary | ICD-10-CM

## 2017-10-08 DIAGNOSIS — K5730 Diverticulosis of large intestine without perforation or abscess without bleeding: Secondary | ICD-10-CM | POA: Diagnosis not present

## 2017-10-08 DIAGNOSIS — K769 Liver disease, unspecified: Secondary | ICD-10-CM | POA: Diagnosis not present

## 2017-10-08 DIAGNOSIS — R918 Other nonspecific abnormal finding of lung field: Secondary | ICD-10-CM | POA: Diagnosis not present

## 2017-10-08 IMAGING — CT CT CHEST W/O CM
2 of 4 series · 14 of 46 positions shown, 16 images · IV contrast (agent unspecified)
Comparison: October 27, 2016

CLINICAL DATA: History of breast cancer. Follow-up lung nodule and
renal nodule.

EXAM:
CT CHEST, ABDOMEN, AND PELVIS WITHOUT AND WITH CONTRAST
TECHNIQUE: Multidetector CT imaging of the chest, abdomen and pelvis was
performed following the standard protocol before and during bolus
administration of intravenous contrast.
CONTRAST:  100 mL of Rsovue-XPP

[Series 2: cap 5.00 br40 s3 ax · axial · 0.69mm/px · z∈[+1228,+1748]mm · 11 of 122 slices shown, 13 images]
[im 9/122  soft-tissue]
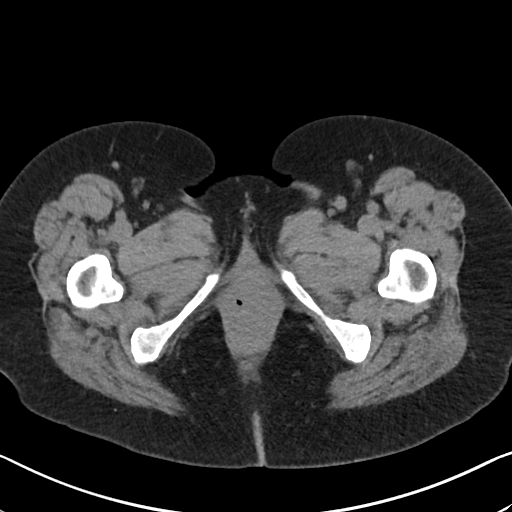
[im 9/122  bone]
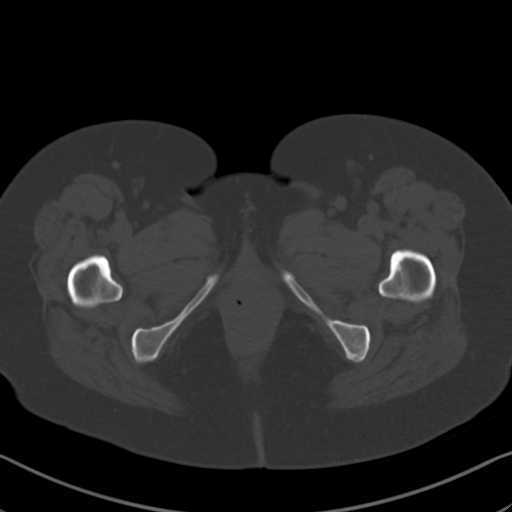
[im 17/122  soft-tissue]
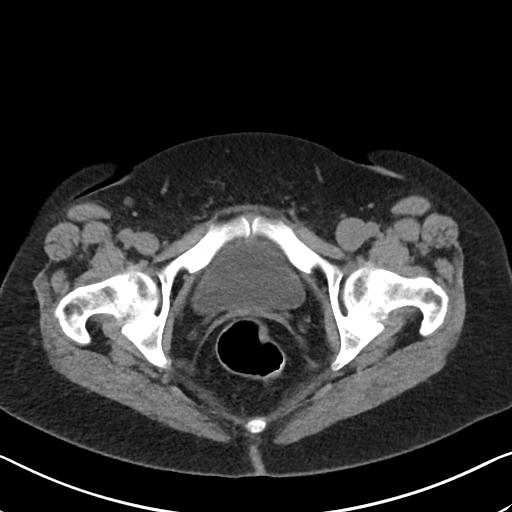
[im 33/122  soft-tissue]
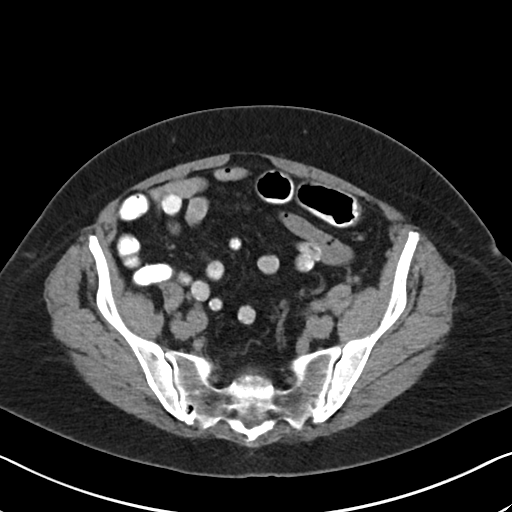
[im 41/122  soft-tissue]
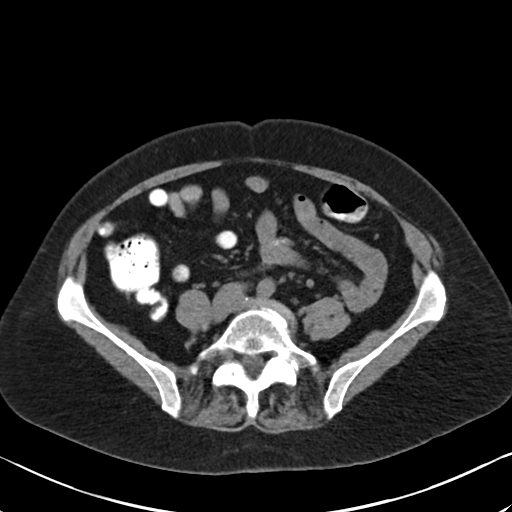
[im 49/122  soft-tissue]
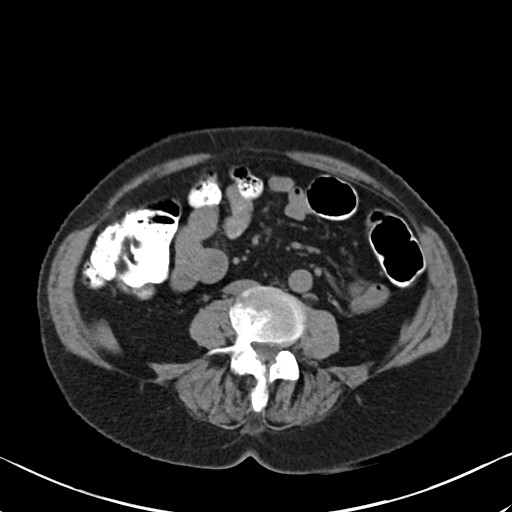
[im 65/122  soft-tissue]
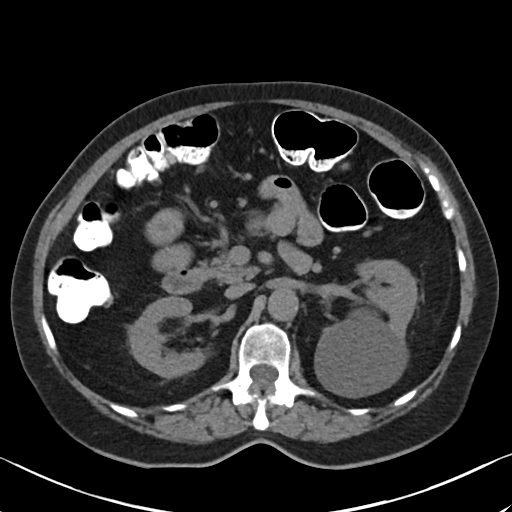
[im 73/122  soft-tissue]
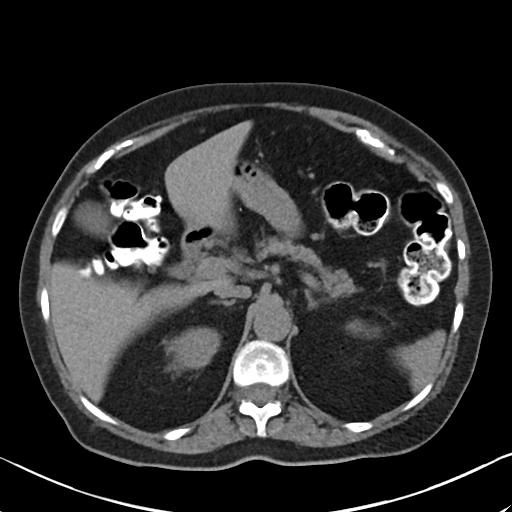
[im 81/122  soft-tissue]
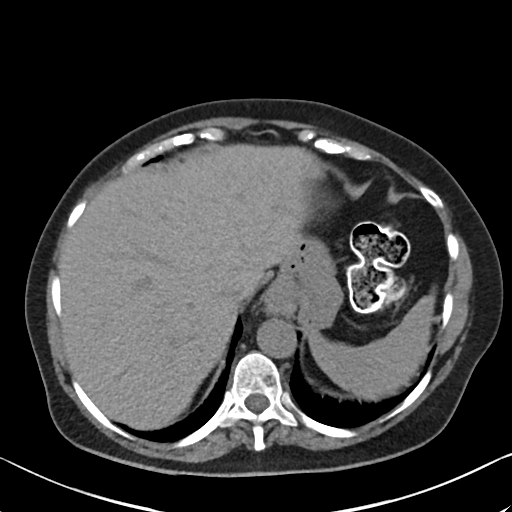
[im 89/122  soft-tissue]
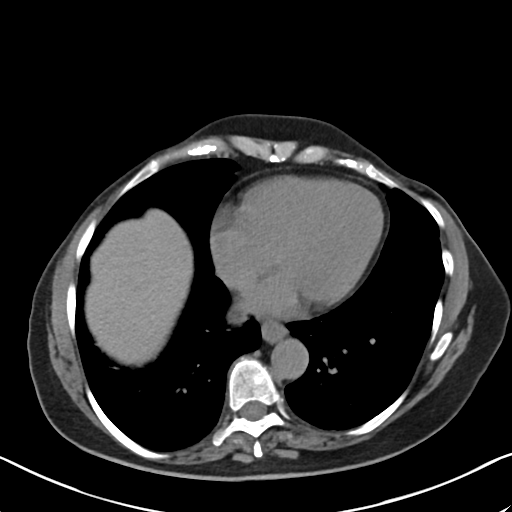
[im 89/122  bone]
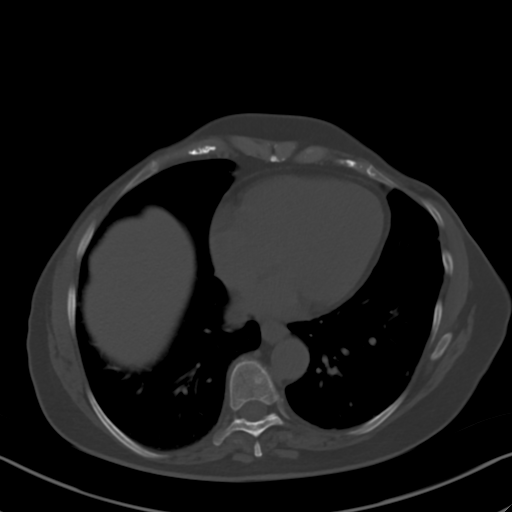
[im 105/122  soft-tissue]
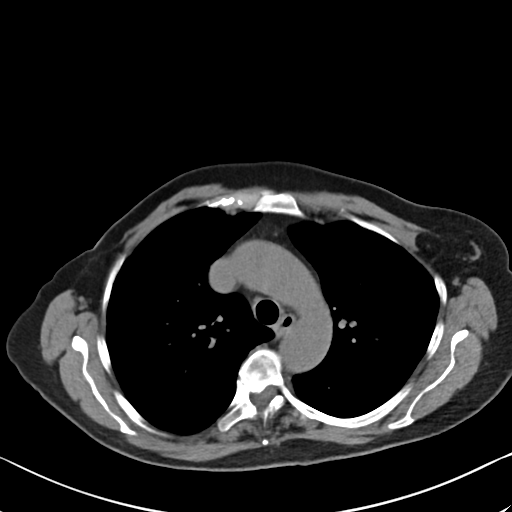
[im 113/122  soft-tissue]
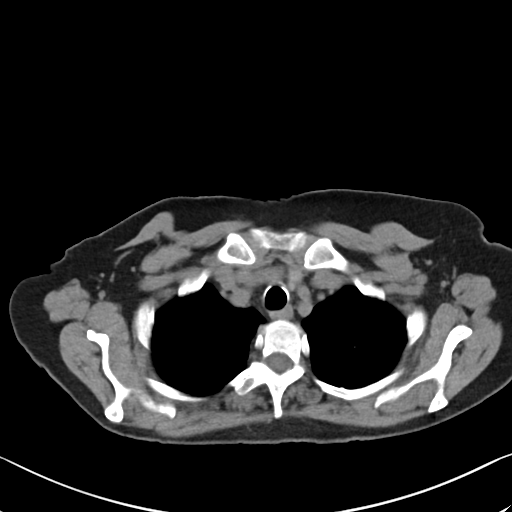

[Series 6: cap 2.00 br40 s3 cor · coronal · 0.69mm/px · 3 of 157 slices shown]
[im 53/157  soft-tissue]
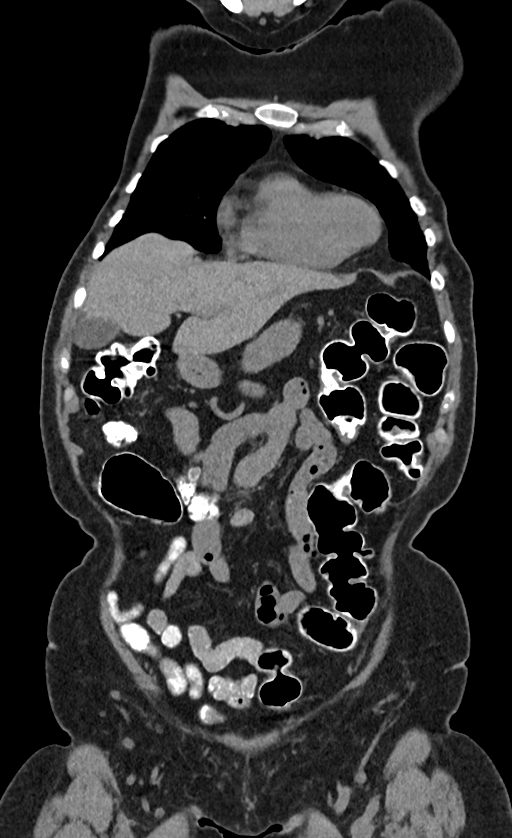
[im 70/157  soft-tissue]
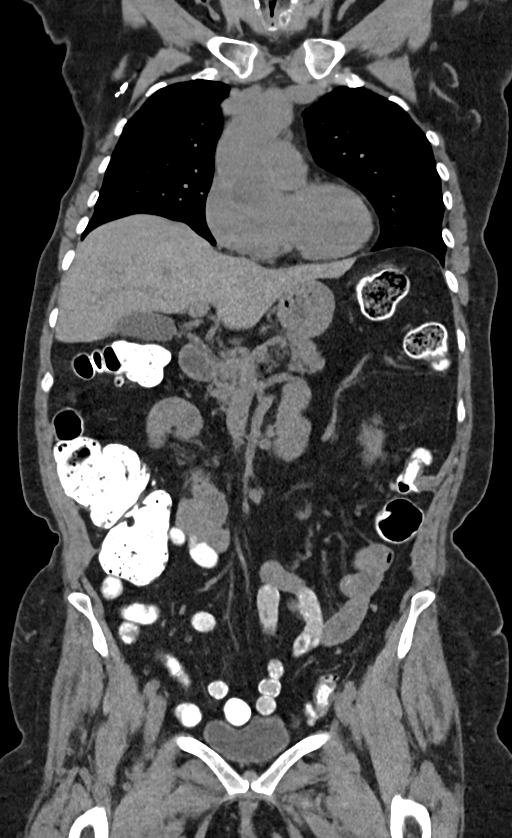
[im 87/157  soft-tissue]
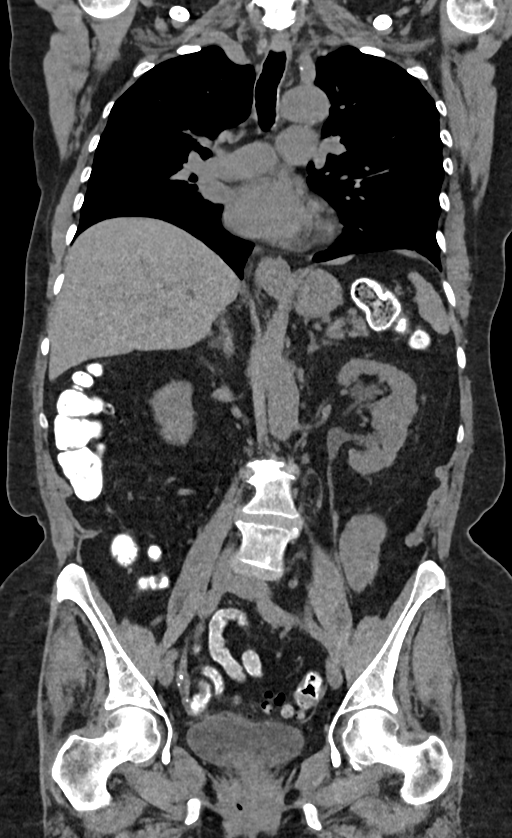

[14 of 46 positions shown; findings below may reference images not displayed]

FINDINGS: CT CHEST FINDINGS

Cardiovascular: The thoracic aorta is stable with no aneurysm or
dissection. No atherosclerotic change. The central pulmonary
arteries are normal. The heart is unchanged with no definitive
coronary artery calcifications.

Mediastinum/Nodes: The thyroid and esophagus are normal. No
adenopathy. The chest wall is stable, status post mastectomies. No
effusions.

Lungs/Pleura: Central airways are normal. No pneumothorax. There is
a subtle ground-glass nodule in the right apex as seen on series 4,
image 25 measuring up to 10 mm. This is difficult to compare to more
remote priors due to difference in slice selection. I do not believe
the finding is acute and there has been no significant change since
Thursday October, 2016. A 3 mm nodule in the right lung on series 4, image 61
is 3 mm and stable. A right-sided 4 mm nodule on series 4, image 65
is stable. Numerous other small nodules on the right are stable. No
new or growing nodules are identified in either lung. No suspicious
infiltrate.

Musculoskeletal: See below.

CT ABDOMEN PELVIS FINDINGS

Hepatobiliary: A low-attenuation lesion in the hepatic dome on
series 3, image 34 measures 16 mm, unchanged. Multiple hepatic cysts
are noted. No suspicious masses. The portal vein is patent. Hepatic
steatosis.

Pancreas: Unremarkable. No pancreatic ductal dilatation or
surrounding inflammatory changes.

Spleen: Normal in size without focal abnormality.

Adrenals/Urinary Tract: Adrenal glands are normal. The largest left
renal cyst has slowly grown since 2305. An interpolar cyst on the
left is unchanged taking difference in slice selection into account
since 2305. No suspicious renal lesions. The ureters and bladder are
normal.

Stomach/Bowel: The esophagus and stomach are normal. The small bowel
is normal. Colonic diverticulosis is seen without diverticulitis.
The colon and appendix are otherwise normal.

Vascular/Lymphatic: The abdominal aorta is nonaneurysmal with no
dissection. No adenopathy.

Reproductive: Status post hysterectomy. No adnexal masses.

Other: No abdominal wall hernia or abnormality. No abdominopelvic
ascites.

Musculoskeletal: Anterior wedging of T12 is stable. No acute
compression fracture. Scoliosis.
IMPRESSION: 1. There is a sub solid ground-glass nodule in the right apex which
is not changed since Thursday October, 2016. It is difficult to compare to
more remote prior studies due to difference in slice selection.
Initial follow-up with CT at 6-12 months is recommended to confirm
persistence. If persistent, repeat CT is recommended every 2 years
until 5 years of stability has been established. This recommendation
follows the consensus statement: Guidelines for Management of
Incidental Pulmonary Nodules Detected on CT Images: From the
2. Numerous tiny solid nodules in the lungs are stable requiring no
additional follow-up.
3. Hepatic lesions, mostly cysts, are stable requiring no additional
follow-up.
4. Renal cysts require no additional follow-up as they have been
stable as well.
5. Colonic diverticulosis

## 2017-10-08 IMAGING — CT CT CHEST W/ CM
2 of 5 series · 13 of 46 positions shown, 15 images · IV contrast (agent unspecified)
Comparison: October 27, 2016

CLINICAL DATA: History of breast cancer. Follow-up lung nodule and
renal nodule.

EXAM:
CT CHEST, ABDOMEN, AND PELVIS WITHOUT AND WITH CONTRAST
TECHNIQUE: Multidetector CT imaging of the chest, abdomen and pelvis was
performed following the standard protocol before and during bolus
administration of intravenous contrast.
CONTRAST:  100 mL of Rsovue-XPP

[Series 3: cap with 5.00 br40 s3 ax · axial · 0.69mm/px · z∈[+1305,+1805]mm · 10 of 118 slices shown, 12 images]
[im 9/118  soft-tissue]
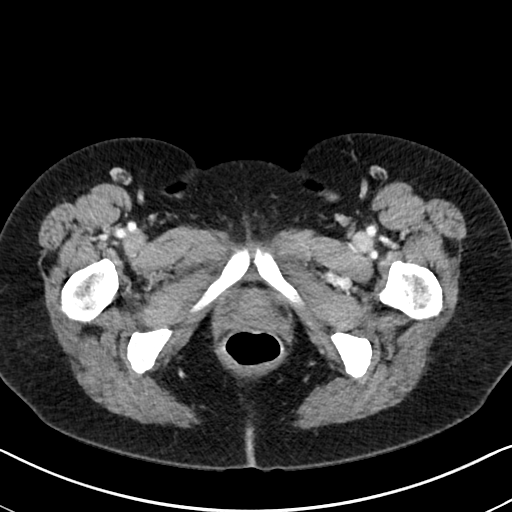
[im 9/118  bone]
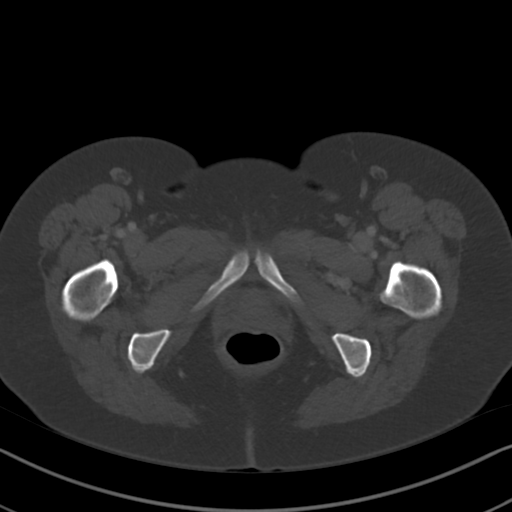
[im 17/118  soft-tissue]
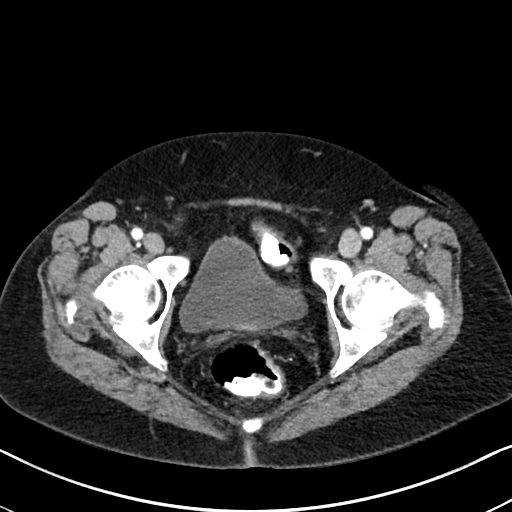
[im 34/118  soft-tissue]
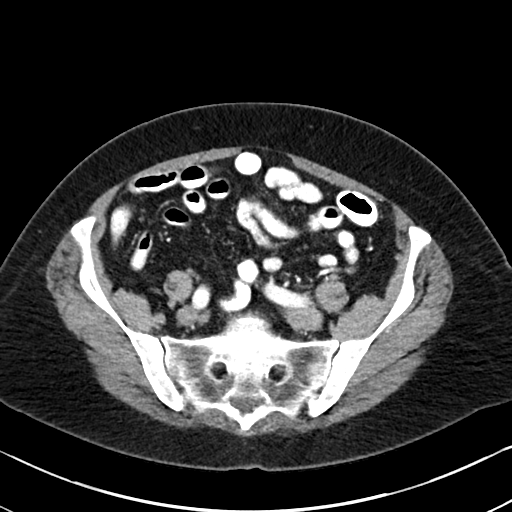
[im 42/118  soft-tissue]
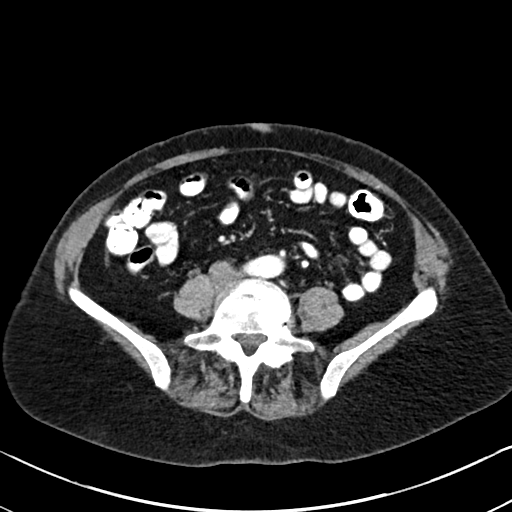
[im 51/118  soft-tissue]
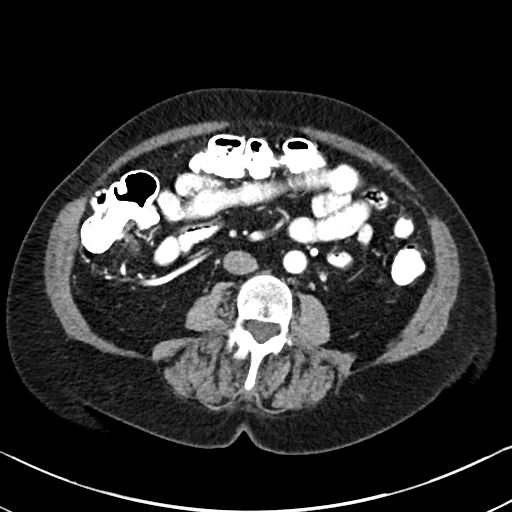
[im 67/118  soft-tissue]
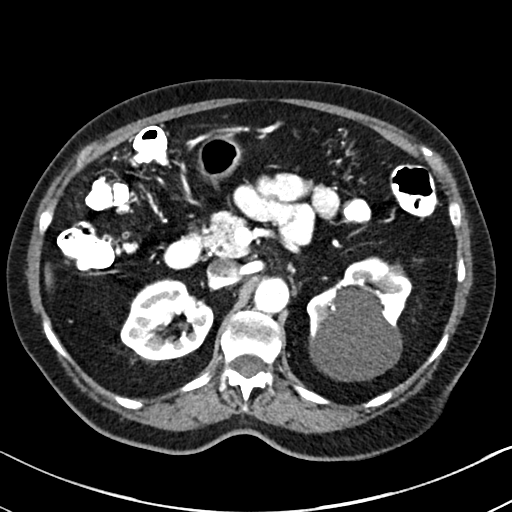
[im 76/118  soft-tissue]
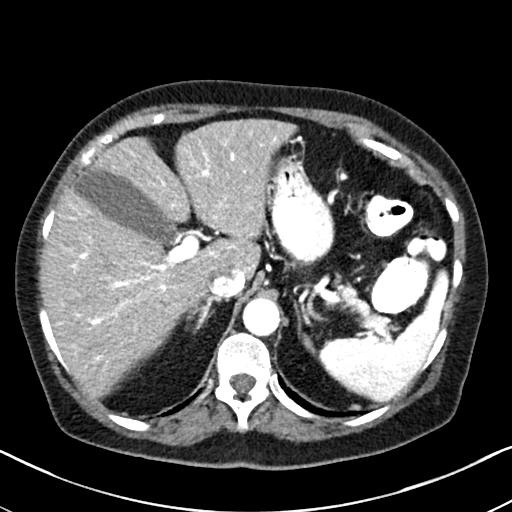
[im 84/118  soft-tissue]
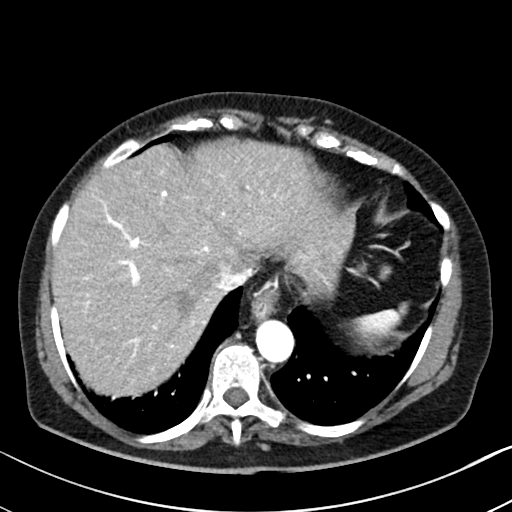
[im 101/118  soft-tissue]
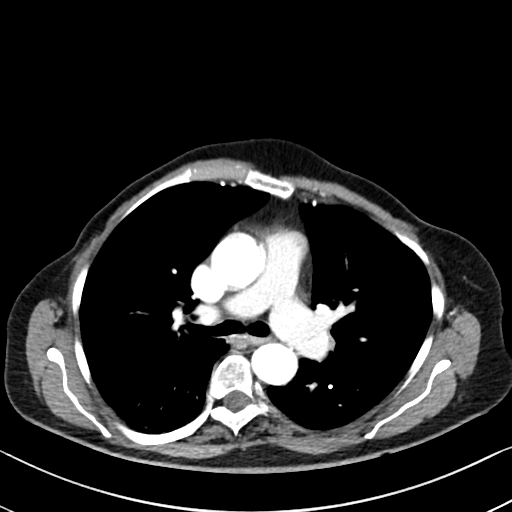
[im 101/118  bone]
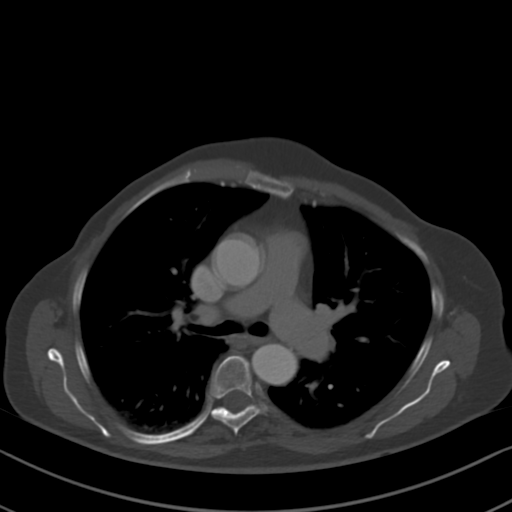
[im 109/118  soft-tissue]
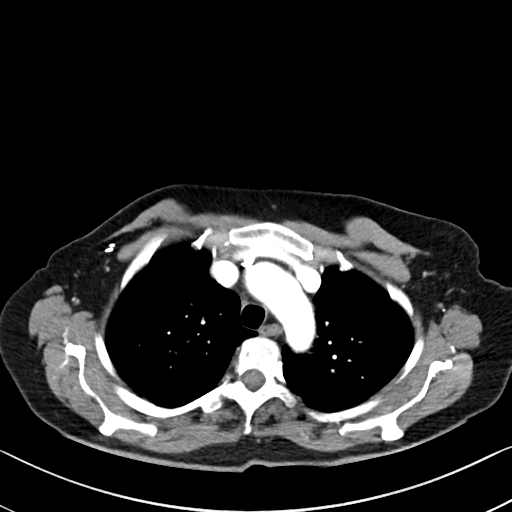

[Series 7: cap with 2.00 br40 s3 cor · coronal · 0.68mm/px · 3 of 167 slices shown]
[im 56/167  soft-tissue]
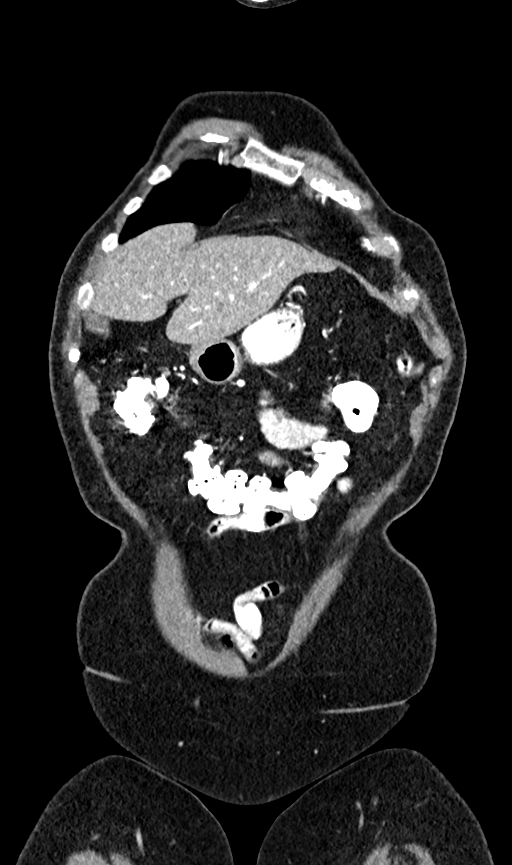
[im 74/167  soft-tissue]
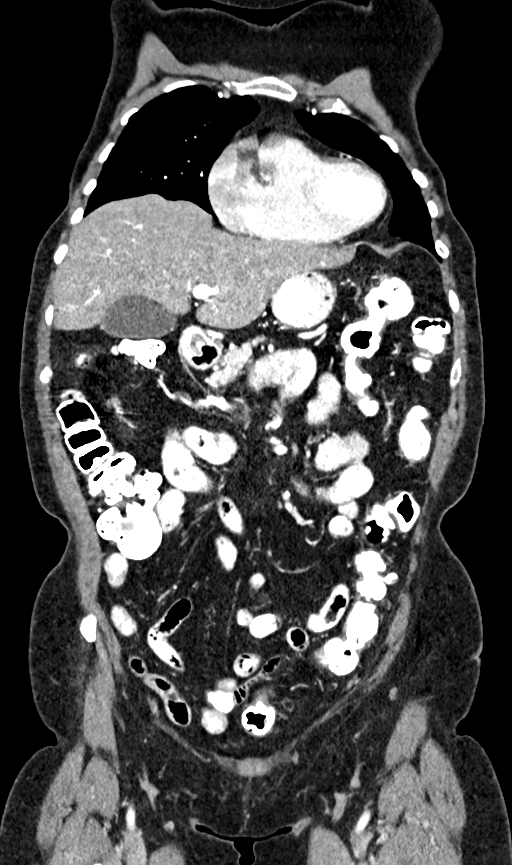
[im 93/167  soft-tissue]
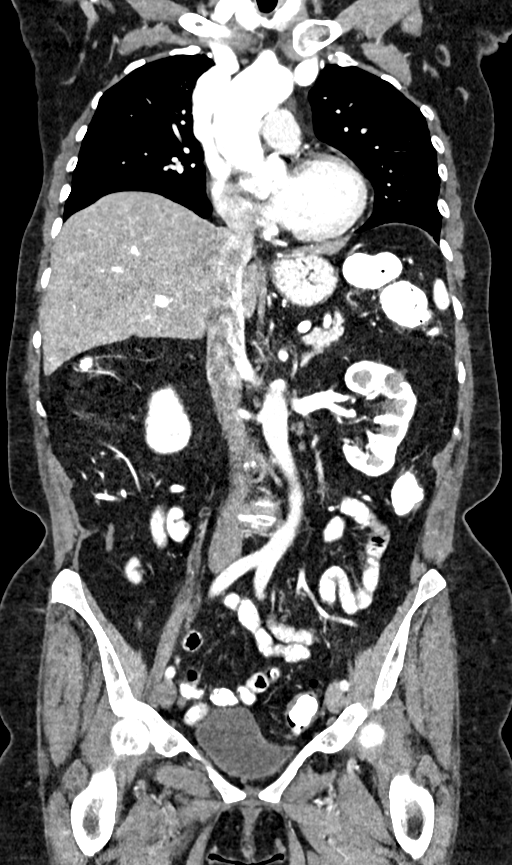

[13 of 46 positions shown; findings below may reference images not displayed]

FINDINGS: CT CHEST FINDINGS

Cardiovascular: The thoracic aorta is stable with no aneurysm or
dissection. No atherosclerotic change. The central pulmonary
arteries are normal. The heart is unchanged with no definitive
coronary artery calcifications.

Mediastinum/Nodes: The thyroid and esophagus are normal. No
adenopathy. The chest wall is stable, status post mastectomies. No
effusions.

Lungs/Pleura: Central airways are normal. No pneumothorax. There is
a subtle ground-glass nodule in the right apex as seen on series 4,
image 25 measuring up to 10 mm. This is difficult to compare to more
remote priors due to difference in slice selection. I do not believe
the finding is acute and there has been no significant change since
Thursday October, 2016. A 3 mm nodule in the right lung on series 4, image 61
is 3 mm and stable. A right-sided 4 mm nodule on series 4, image 65
is stable. Numerous other small nodules on the right are stable. No
new or growing nodules are identified in either lung. No suspicious
infiltrate.

Musculoskeletal: See below.

CT ABDOMEN PELVIS FINDINGS

Hepatobiliary: A low-attenuation lesion in the hepatic dome on
series 3, image 34 measures 16 mm, unchanged. Multiple hepatic cysts
are noted. No suspicious masses. The portal vein is patent. Hepatic
steatosis.

Pancreas: Unremarkable. No pancreatic ductal dilatation or
surrounding inflammatory changes.

Spleen: Normal in size without focal abnormality.

Adrenals/Urinary Tract: Adrenal glands are normal. The largest left
renal cyst has slowly grown since 2305. An interpolar cyst on the
left is unchanged taking difference in slice selection into account
since 2305. No suspicious renal lesions. The ureters and bladder are
normal.

Stomach/Bowel: The esophagus and stomach are normal. The small bowel
is normal. Colonic diverticulosis is seen without diverticulitis.
The colon and appendix are otherwise normal.

Vascular/Lymphatic: The abdominal aorta is nonaneurysmal with no
dissection. No adenopathy.

Reproductive: Status post hysterectomy. No adnexal masses.

Other: No abdominal wall hernia or abnormality. No abdominopelvic
ascites.

Musculoskeletal: Anterior wedging of T12 is stable. No acute
compression fracture. Scoliosis.
IMPRESSION: 1. There is a sub solid ground-glass nodule in the right apex which
is not changed since Thursday October, 2016. It is difficult to compare to
more remote prior studies due to difference in slice selection.
Initial follow-up with CT at 6-12 months is recommended to confirm
persistence. If persistent, repeat CT is recommended every 2 years
until 5 years of stability has been established. This recommendation
follows the consensus statement: Guidelines for Management of
Incidental Pulmonary Nodules Detected on CT Images: From the
2. Numerous tiny solid nodules in the lungs are stable requiring no
additional follow-up.
3. Hepatic lesions, mostly cysts, are stable requiring no additional
follow-up.
4. Renal cysts require no additional follow-up as they have been
stable as well.
5. Colonic diverticulosis

## 2017-10-18 ENCOUNTER — Other Ambulatory Visit (HOSPITAL_COMMUNITY): Payer: Self-pay | Admitting: Internal Medicine

## 2017-10-18 ENCOUNTER — Ambulatory Visit (HOSPITAL_COMMUNITY)
Admission: RE | Admit: 2017-10-18 | Discharge: 2017-10-18 | Disposition: A | Discharge status: Home / Self Care | Payer: Medicare Other | Source: Ambulatory Visit | Attending: Vascular Surgery | Admitting: Vascular Surgery

## 2017-10-18 DIAGNOSIS — I6529 Occlusion and stenosis of unspecified carotid artery: Secondary | ICD-10-CM

## 2017-10-18 DIAGNOSIS — I6523 Occlusion and stenosis of bilateral carotid arteries: Secondary | ICD-10-CM | POA: Diagnosis not present

## 2017-11-19 DIAGNOSIS — L570 Actinic keratosis: Secondary | ICD-10-CM | POA: Diagnosis not present

## 2017-11-19 DIAGNOSIS — X32XXXD Exposure to sunlight, subsequent encounter: Secondary | ICD-10-CM | POA: Diagnosis not present

## 2018-01-22 DIAGNOSIS — L255 Unspecified contact dermatitis due to plants, except food: Secondary | ICD-10-CM | POA: Diagnosis not present

## 2018-02-26 DIAGNOSIS — Z23 Encounter for immunization: Secondary | ICD-10-CM | POA: Diagnosis not present

## 2018-02-26 DIAGNOSIS — E7849 Other hyperlipidemia: Secondary | ICD-10-CM | POA: Diagnosis not present

## 2018-02-26 DIAGNOSIS — R350 Frequency of micturition: Secondary | ICD-10-CM | POA: Diagnosis not present

## 2018-03-20 DIAGNOSIS — H5201 Hypermetropia, right eye: Secondary | ICD-10-CM | POA: Diagnosis not present

## 2018-06-19 DIAGNOSIS — E559 Vitamin D deficiency, unspecified: Secondary | ICD-10-CM | POA: Diagnosis not present

## 2018-06-19 DIAGNOSIS — R82998 Other abnormal findings in urine: Secondary | ICD-10-CM | POA: Diagnosis not present

## 2018-06-19 DIAGNOSIS — E7849 Other hyperlipidemia: Secondary | ICD-10-CM | POA: Diagnosis not present

## 2018-06-28 DIAGNOSIS — R918 Other nonspecific abnormal finding of lung field: Secondary | ICD-10-CM | POA: Diagnosis not present

## 2018-06-28 DIAGNOSIS — Z0000 Encounter for general adult medical examination without abnormal findings: Secondary | ICD-10-CM | POA: Diagnosis not present

## 2018-06-28 DIAGNOSIS — E559 Vitamin D deficiency, unspecified: Secondary | ICD-10-CM | POA: Diagnosis not present

## 2018-06-28 DIAGNOSIS — K7689 Other specified diseases of liver: Secondary | ICD-10-CM | POA: Diagnosis not present

## 2018-06-28 DIAGNOSIS — Z1212 Encounter for screening for malignant neoplasm of rectum: Secondary | ICD-10-CM | POA: Diagnosis not present

## 2018-07-18 ENCOUNTER — Encounter: Payer: Self-pay | Admitting: Gastroenterology

## 2018-07-31 DIAGNOSIS — Z1283 Encounter for screening for malignant neoplasm of skin: Secondary | ICD-10-CM | POA: Diagnosis not present

## 2018-07-31 DIAGNOSIS — L908 Other atrophic disorders of skin: Secondary | ICD-10-CM | POA: Diagnosis not present

## 2018-07-31 DIAGNOSIS — L821 Other seborrheic keratosis: Secondary | ICD-10-CM | POA: Diagnosis not present

## 2018-08-13 ENCOUNTER — Ambulatory Visit: Payer: Medicare Other | Admitting: Gastroenterology

## 2018-10-11 DIAGNOSIS — M25562 Pain in left knee: Secondary | ICD-10-CM | POA: Diagnosis not present

## 2018-10-11 DIAGNOSIS — M7052 Other bursitis of knee, left knee: Secondary | ICD-10-CM | POA: Diagnosis not present

## 2018-10-11 DIAGNOSIS — B373 Candidiasis of vulva and vagina: Secondary | ICD-10-CM | POA: Diagnosis not present

## 2018-10-29 ENCOUNTER — Other Ambulatory Visit: Payer: Self-pay | Admitting: Internal Medicine

## 2018-11-18 DIAGNOSIS — L508 Other urticaria: Secondary | ICD-10-CM | POA: Diagnosis not present

## 2018-12-18 ENCOUNTER — Other Ambulatory Visit: Payer: Self-pay | Admitting: Internal Medicine

## 2018-12-18 DIAGNOSIS — Z853 Personal history of malignant neoplasm of breast: Secondary | ICD-10-CM

## 2018-12-18 DIAGNOSIS — R911 Solitary pulmonary nodule: Secondary | ICD-10-CM

## 2018-12-18 DIAGNOSIS — N281 Cyst of kidney, acquired: Secondary | ICD-10-CM

## 2018-12-31 ENCOUNTER — Ambulatory Visit
Admission: RE | Admit: 2018-12-31 | Discharge: 2018-12-31 | Disposition: A | Discharge status: Home / Self Care | Payer: Medicare Other | Source: Ambulatory Visit | Attending: Internal Medicine | Admitting: Internal Medicine

## 2018-12-31 DIAGNOSIS — K7689 Other specified diseases of liver: Secondary | ICD-10-CM | POA: Diagnosis not present

## 2018-12-31 DIAGNOSIS — N281 Cyst of kidney, acquired: Secondary | ICD-10-CM

## 2018-12-31 DIAGNOSIS — Z853 Personal history of malignant neoplasm of breast: Secondary | ICD-10-CM

## 2018-12-31 DIAGNOSIS — R911 Solitary pulmonary nodule: Secondary | ICD-10-CM | POA: Diagnosis not present

## 2018-12-31 IMAGING — CT CT ABDOMEN AND PELVIS WITH CONTRAST
2 of 5 series · 15 of 46 positions shown, 17 images · IV contrast (iopamidol)
Comparison: Abdominopelvic CT 10/08/2017 and 11/06/2016.

CLINICAL DATA: Follow up renal cystic disease. History of
right-sided breast cancer with bilateral mastectomy.

Creatinine was obtained on site at [HOSPITAL] at [HOSPITAL].
Results: Creatinine 1.0 mg/dL.
EXAM:
CT ABDOMEN AND PELVIS WITH CONTRAST
TECHNIQUE: Multidetector CT imaging of the abdomen and pelvis was performed
using the standard protocol following bolus administration of
intravenous contrast.
CONTRAST:  100mL P0KFYO-C33 IOPAMIDOL (P0KFYO-C33) INJECTION 61%

[Series 2: abd pelvis 5.00 br40 s3 axial · axial · 0.72mm/px · z∈[+1045,+1485]mm · 12 of 100 slices shown, 14 images]
[im 6/100  soft-tissue]
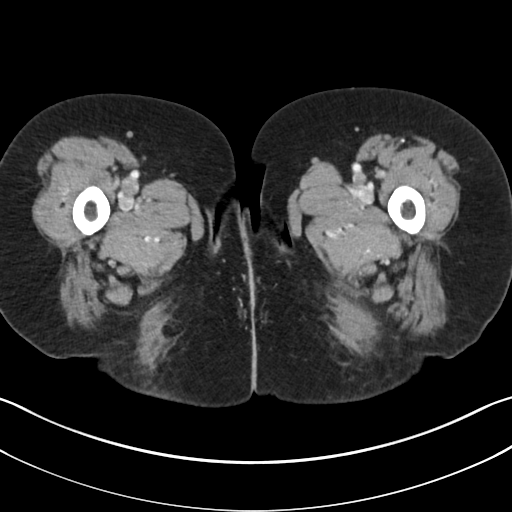
[im 6/100  bone]
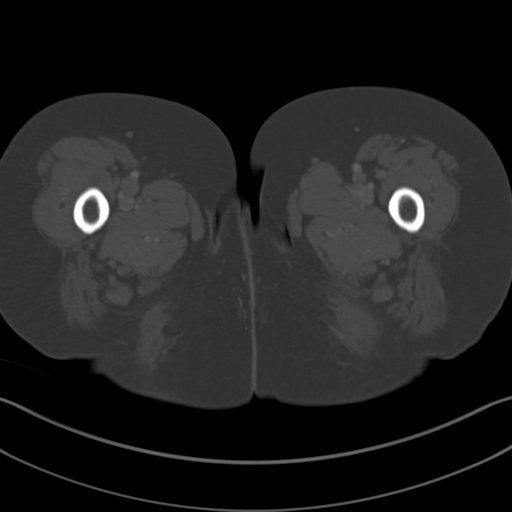
[im 16/100  soft-tissue]
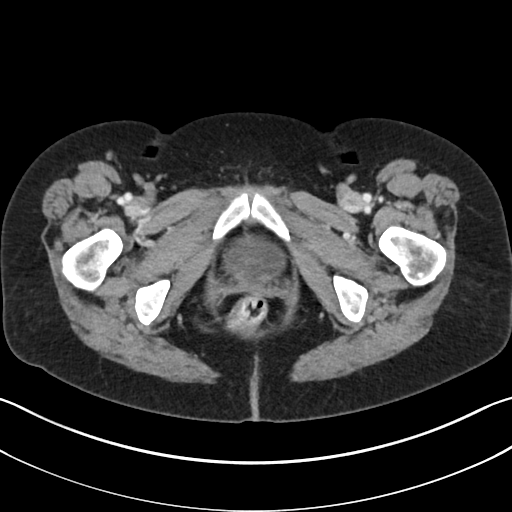
[im 21/100  soft-tissue]
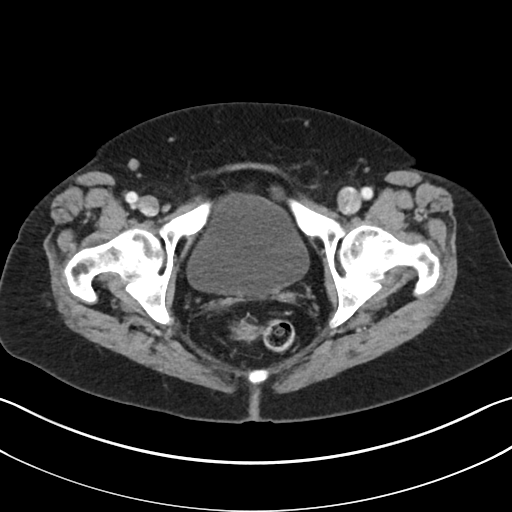
[im 32/100  soft-tissue]
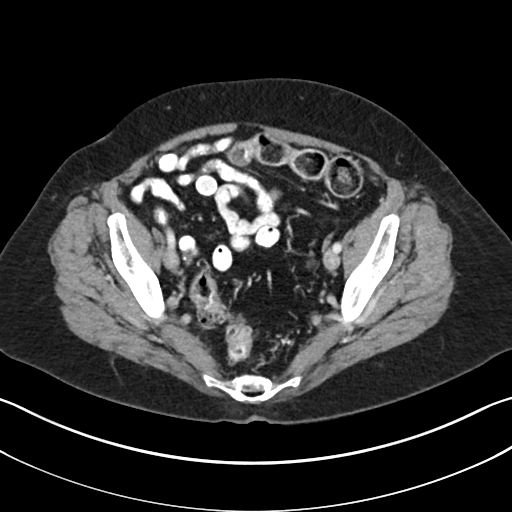
[im 37/100  soft-tissue]
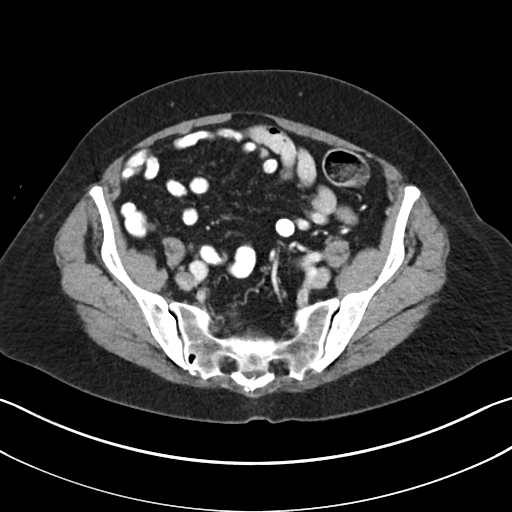
[im 47/100  soft-tissue]
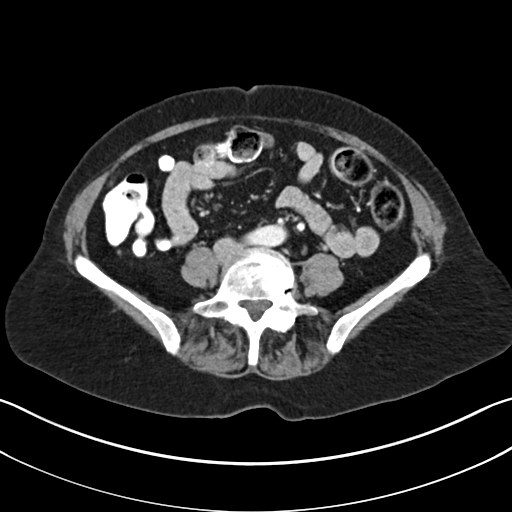
[im 53/100  soft-tissue]
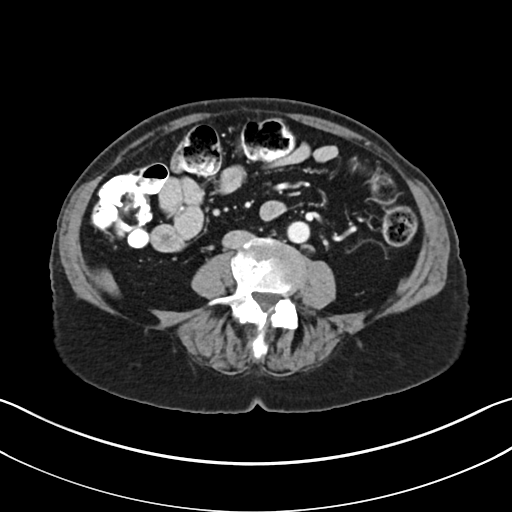
[im 63/100  soft-tissue]
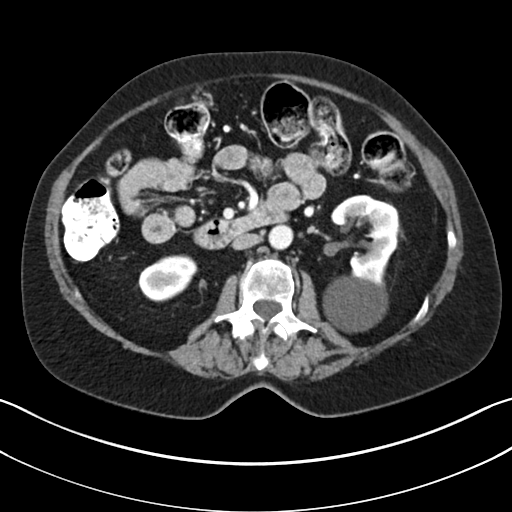
[im 68/100  soft-tissue]
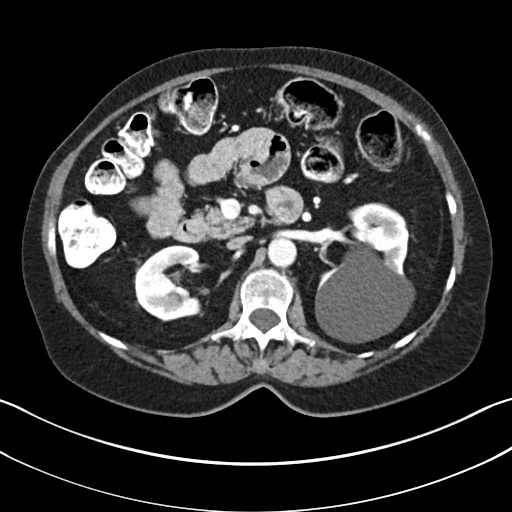
[im 68/100  bone]
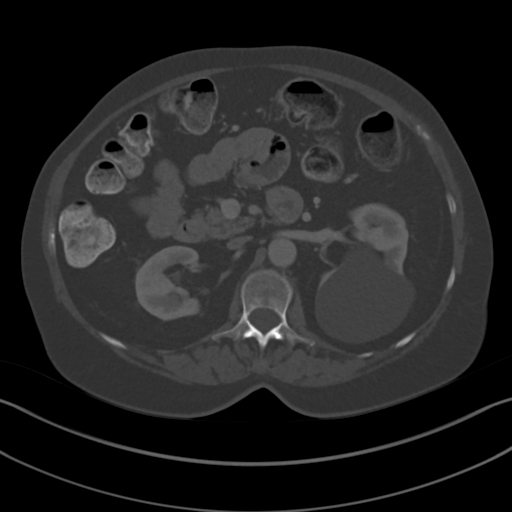
[im 79/100  soft-tissue]
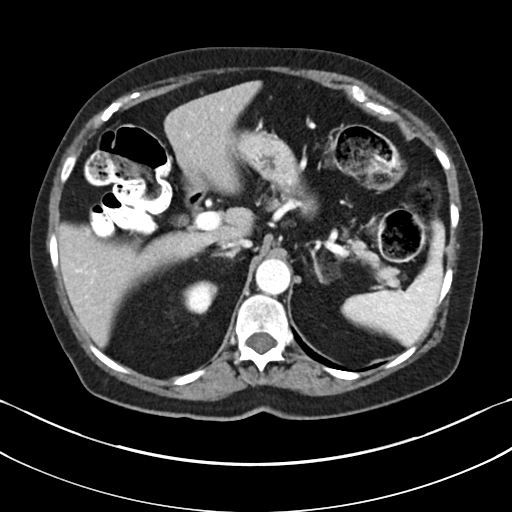
[im 84/100  soft-tissue]
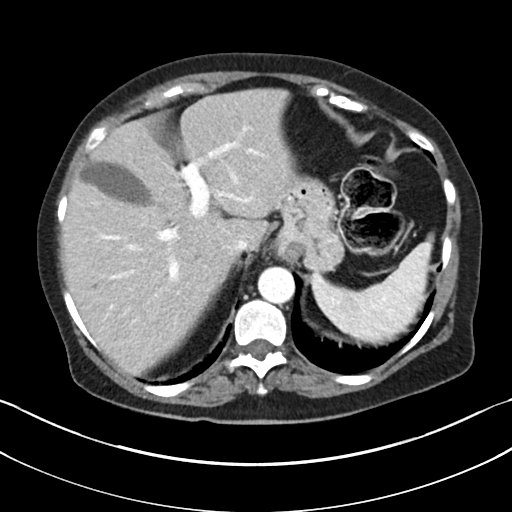
[im 94/100  soft-tissue]
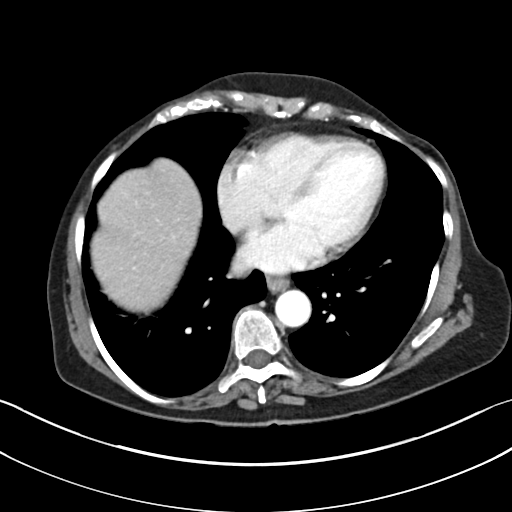

[Series 6: abd pelvis 2.00 br40 s3 cor · coronal · 0.72mm/px · 3 of 148 slices shown]
[im 50/148  soft-tissue]
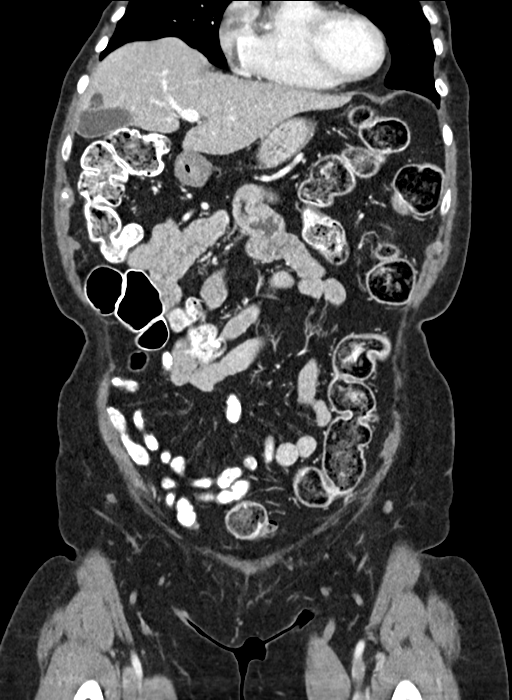
[im 66/148  soft-tissue]
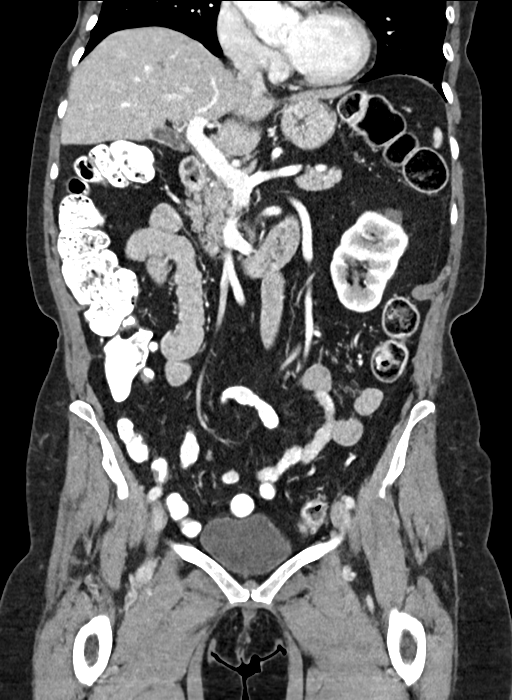
[im 82/148  soft-tissue]
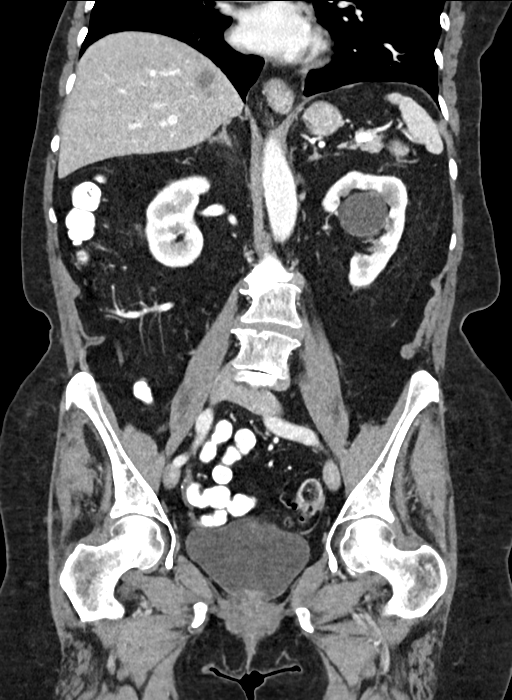

[15 of 46 positions shown; findings below may reference images not displayed]

FINDINGS: Lower chest:  Dictated separately.  No acute findings.

Hepatobiliary: There are stable scattered low-density hepatic
lesions consistent with cysts or other benign lesions. Largest is
located medially in the dome of the right lobe, measuring 16 mm on
image [DATE]. No new or enlarging hepatic lesions. No evidence of
gallstones, gallbladder wall thickening or biliary dilatation.

Pancreas: Unremarkable. No pancreatic ductal dilatation or
surrounding inflammatory changes.

Spleen: Normal in size without focal abnormality.

Adrenals/Urinary Tract: Both adrenal glands appear normal. The right
kidney appears normal. Simple left renal cysts are again noted,
measuring up to 7.6 x 6.8 cm posteriorly in the upper pole. This has
not significantly changed from the most recent study and
demonstrates no concerning features. There is no hydronephrosis or
urinary tract calculus. The bladder appears normal.

Stomach/Bowel: No evidence of bowel wall thickening, distention or
surrounding inflammatory change. The appendix appears normal. Mild
sigmoid diverticulosis.

Vascular/Lymphatic: There are no enlarged abdominal or pelvic lymph
nodes. Minimal aortic and branch vessel atherosclerosis.

Reproductive: Hysterectomy.  No adnexal mass.

Other: No ascites or peritoneal nodularity.

Musculoskeletal: No acute or significant osseous findings. Stable
lumbar spondylosis and convex left scoliosis.
IMPRESSION: 1. Stable simple left renal cysts. These demonstrate no concerning
features and do not require any specific follow-up. (Bosniak 1).
2. Stable hepatic cysts.
3. No evidence of metastatic breast cancer or acute abdominal
findings.

## 2018-12-31 IMAGING — CT CT CHEST WITHOUT CONTRAST
2 of 4 series · 13 of 36 positions shown, 16 images · non-contrast
Comparison: Chest CT 10/08/2017 and 10/27/2016.

CLINICAL DATA: Follow up pulmonary nodule. History of right breast
cancer with bilateral mastectomy.

EXAM:
CT CHEST WITHOUT CONTRAST
TECHNIQUE: Multidetector CT imaging of the chest was performed following the
standard protocol without IV contrast.

[Series 2: chest 2.00 br40 s3 · axial · 0.61mm/px · z∈[+1406,+1626]mm · 10 of 131 slices shown, 13 images (1 of 2)]
[im 11/131  mediastinal]
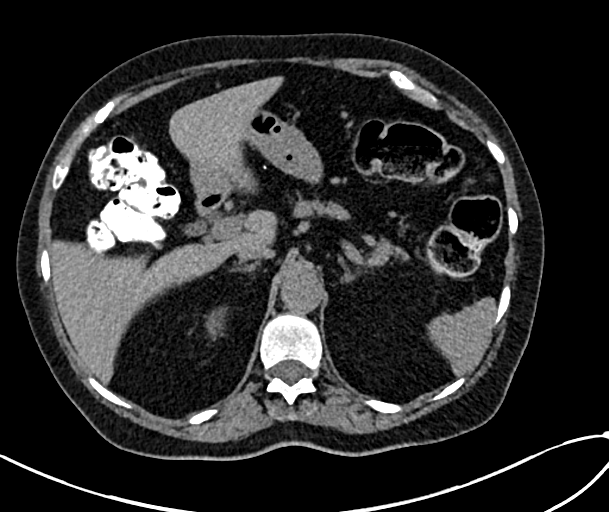
[im 11/131  lung]
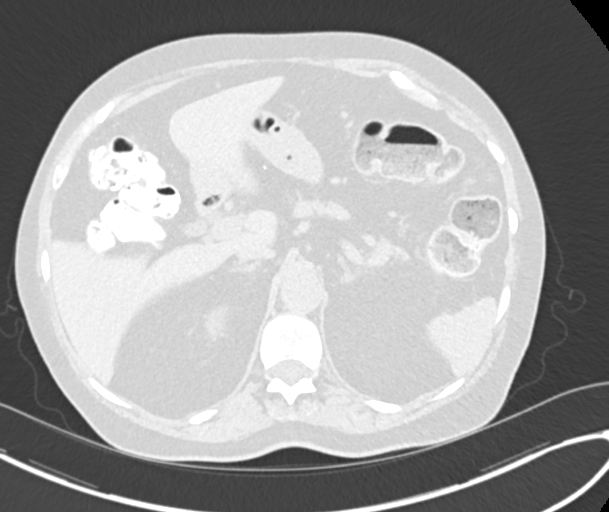
[im 21/131  lung]
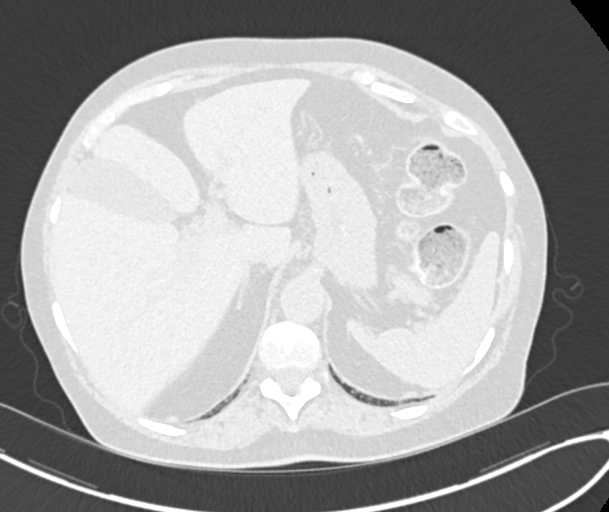
[im 41/131  lung]
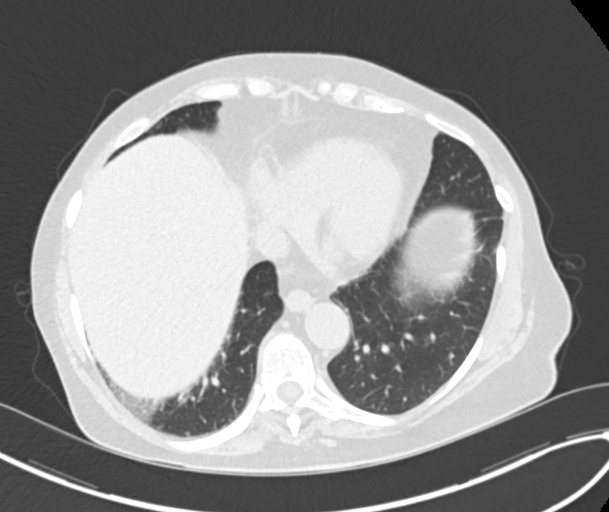
[im 51/131  lung]
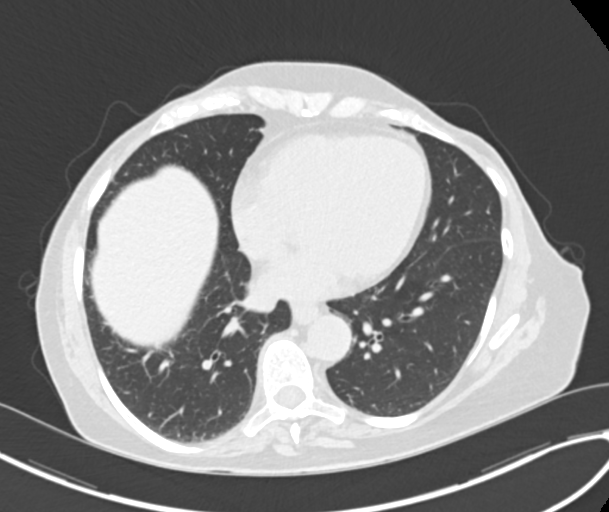
[im 61/131  mediastinal]
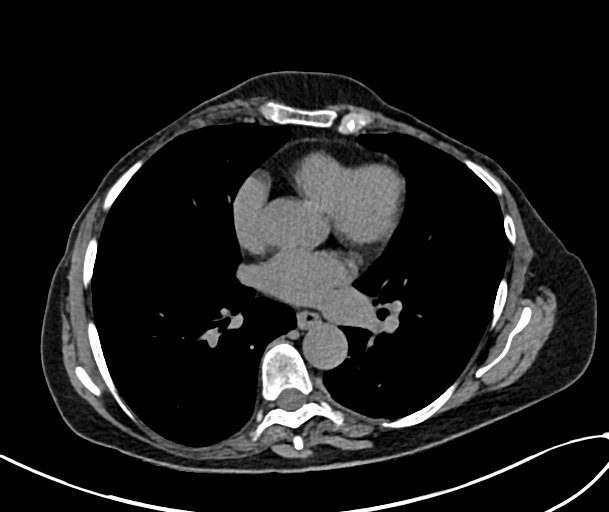
[im 61/131  lung]
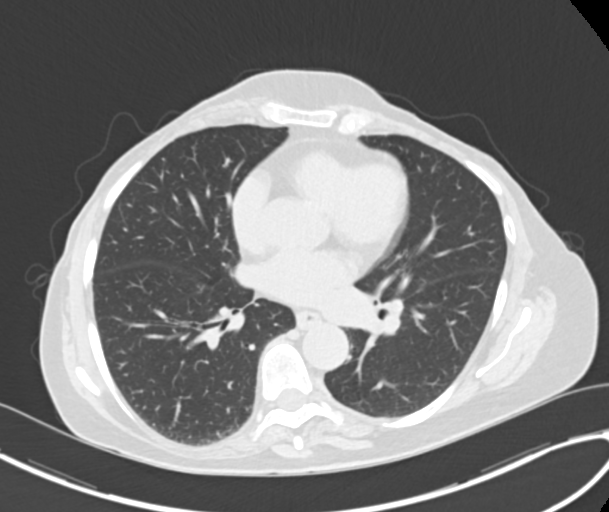
[im 71/131  lung]
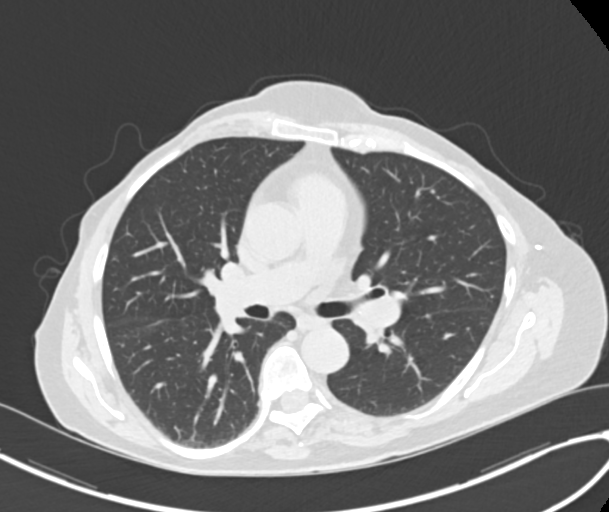
[im 81/131  lung]
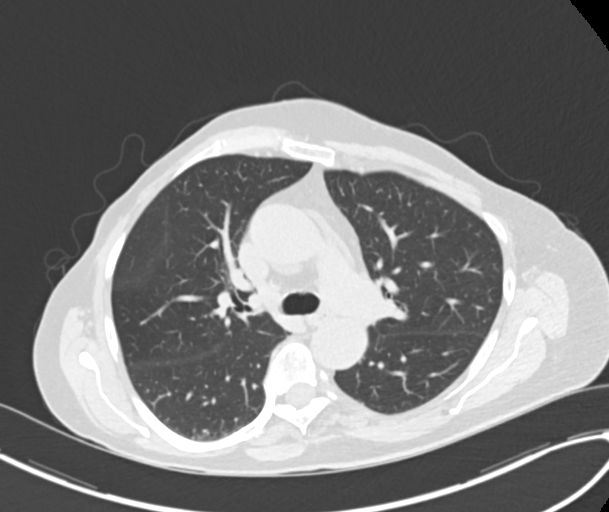
[im 101/131  lung]
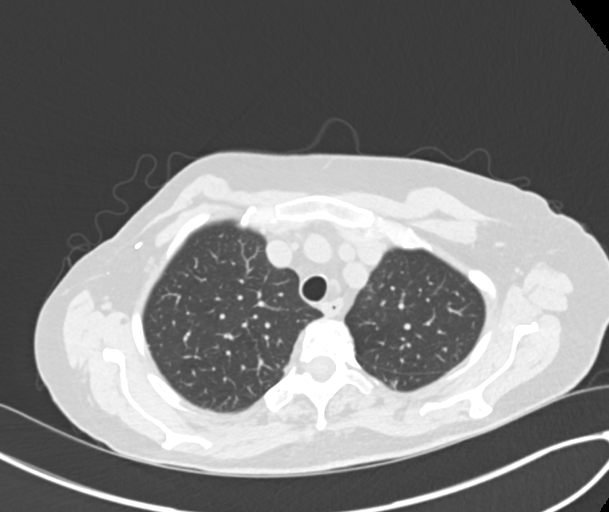
[im 111/131  mediastinal]
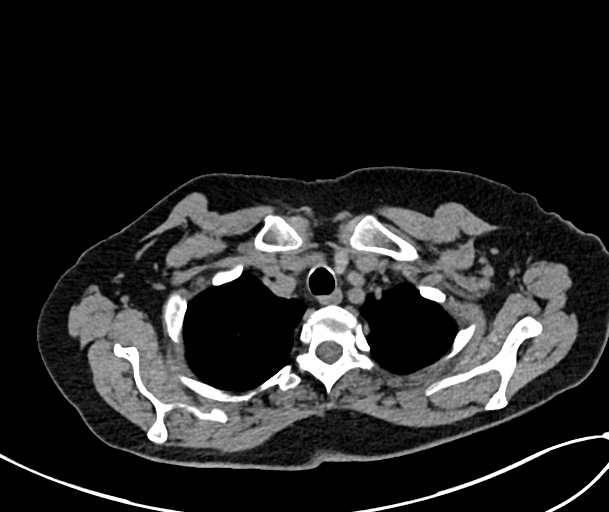
[im 111/131  lung]
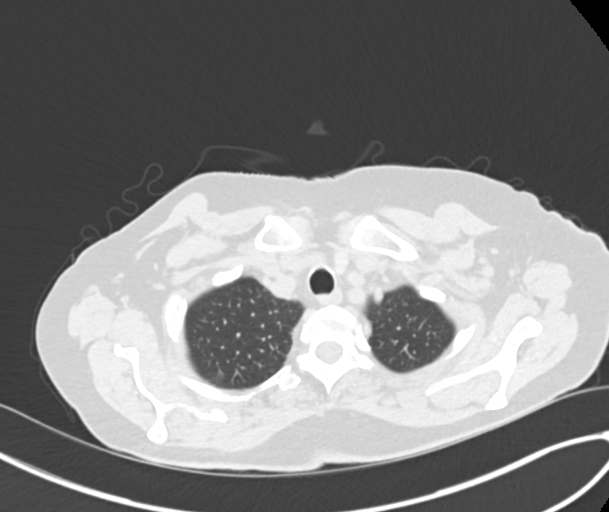
[im 121/131  lung]
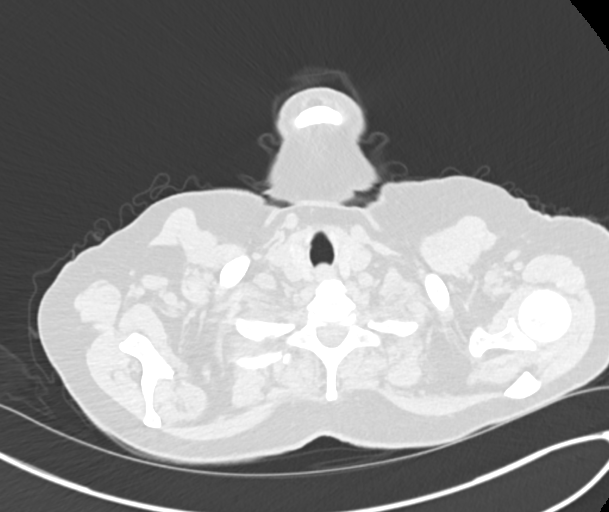

[Series 4: chest 2.00 br40 s3 · coronal · 0.51mm/px · 3 of 155 slices shown (2 of 2)]
[im 31/155  lung]
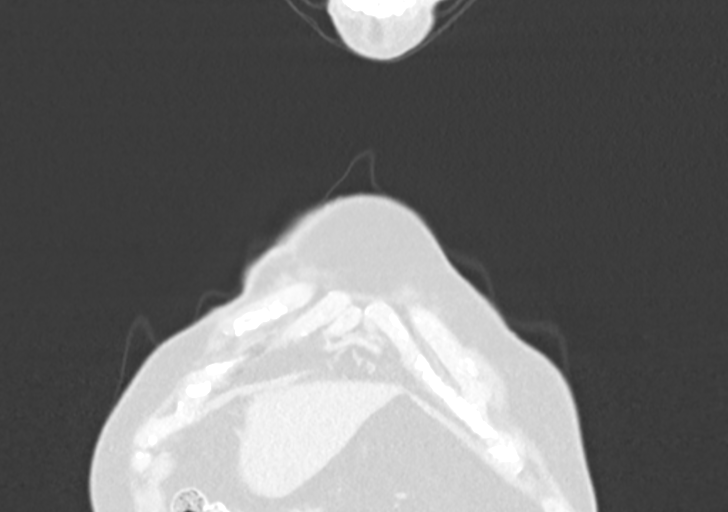
[im 62/155  lung]
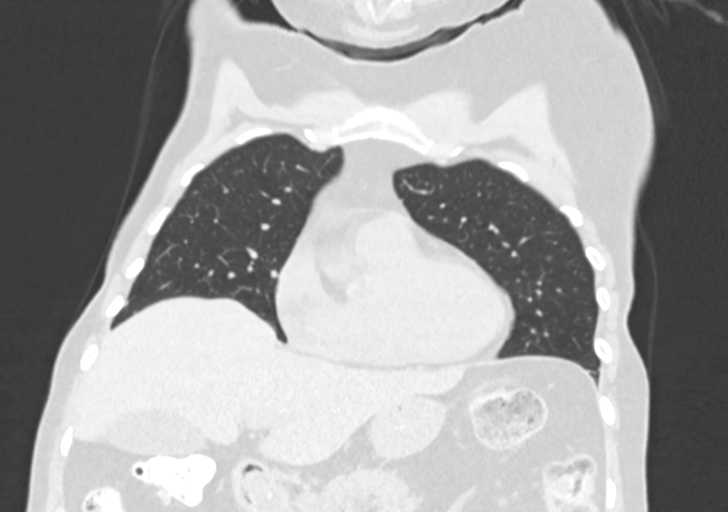
[im 93/155  lung]
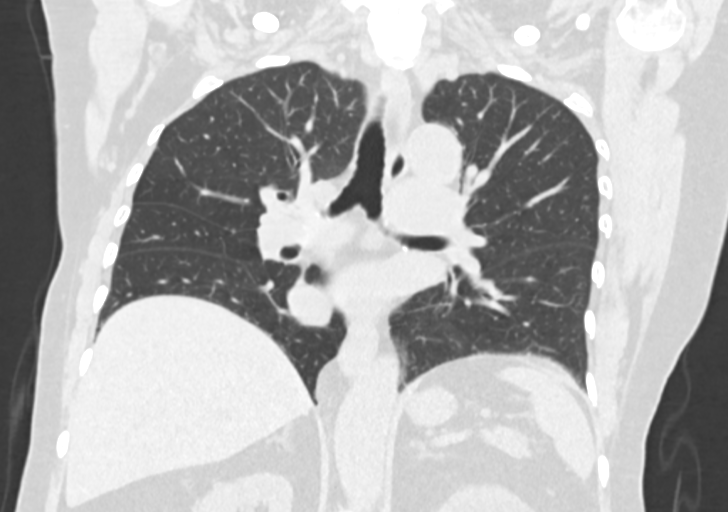

[13 of 36 positions shown; findings below may reference images not displayed]

FINDINGS: Cardiovascular: Minimal atherosclerosis of the aorta and great
vessels. No acute vascular findings on noncontrast imaging. The
heart size is normal. There is no pericardial effusion.

Mediastinum/Nodes: There are no enlarged mediastinal, axillary,
internal mammary or hilar lymph nodes. There are surgical clips in
the right axilla status post right axillary dissection.There is a
small hiatal hernia. The trachea and thyroid gland appear normal.

Lungs/Pleura: There is no pleural effusion or pneumothorax. Right
apical ground-glass opacity measuring 9 mm on image [DATE] is stable.
This has no solid components. There are scattered small solid
pulmonary nodules bilaterally which are stable. Largest is 4 mm in
the left lower lobe on image 85/8. No new or enlarging nodules.

Upper abdomen:  See separate report.

Musculoskeletal/Chest wall: There is no chest wall mass or
suspicious osseous finding. There is a convex right thoracic
scoliosis. Previous bilateral mastectomy.
IMPRESSION: 1. Scattered small pulmonary nodules are stable from at least
10/27/2016, consistent with benign findings. No new or enlarging
nodules.
2. No evidence of chest wall recurrence or thoracic adenopathy.
3. No acute chest findings.

## 2018-12-31 MED ORDER — IOPAMIDOL (ISOVUE-300) INJECTION 61%
100.0000 mL | Freq: Once | INTRAVENOUS | Status: AC | PRN
  Administered 2018-12-31: 100 mL via INTRAVENOUS

## 2019-01-13 DIAGNOSIS — S80862A Insect bite (nonvenomous), left lower leg, initial encounter: Secondary | ICD-10-CM | POA: Diagnosis not present

## 2019-01-13 DIAGNOSIS — S80861A Insect bite (nonvenomous), right lower leg, initial encounter: Secondary | ICD-10-CM | POA: Diagnosis not present

## 2019-01-20 DIAGNOSIS — Z0120 Encounter for dental examination and cleaning without abnormal findings: Secondary | ICD-10-CM | POA: Diagnosis not present

## 2019-02-07 DIAGNOSIS — C50911 Malignant neoplasm of unspecified site of right female breast: Secondary | ICD-10-CM | POA: Diagnosis not present

## 2019-02-07 DIAGNOSIS — Z9012 Acquired absence of left breast and nipple: Secondary | ICD-10-CM | POA: Diagnosis not present

## 2019-02-27 DIAGNOSIS — Z23 Encounter for immunization: Secondary | ICD-10-CM | POA: Diagnosis not present

## 2019-05-05 DIAGNOSIS — Z03818 Encounter for observation for suspected exposure to other biological agents ruled out: Secondary | ICD-10-CM | POA: Diagnosis not present

## 2019-06-13 ENCOUNTER — Ambulatory Visit: Payer: Medicare Other | Attending: Internal Medicine

## 2019-06-13 DIAGNOSIS — Z23 Encounter for immunization: Secondary | ICD-10-CM

## 2019-06-13 NOTE — Progress Notes (Signed)
   Covid-19 Vaccination Clinic  Name:  [name]    MRN: [id number] DOB: 02/25/1946  06/13/2019  [name] was observed post Covid-19 immunization for 15 minutes without incidence. She was provided with Vaccine Information Sheet and instruction to access the V-Safe system.   [name] was instructed to call 911 with any severe reactions post vaccine: Marland Kitchen Difficulty breathing  . Swelling of your face and throat  . A fast heartbeat  . A bad rash all over your body  . Dizziness and weakness    Immunizations Administered    Name Date Dose VIS Date Route   Pfizer COVID-19 Vaccine 06/13/2019  3:21 PM 0.3 mL 05/02/2019 Intramuscular   Manufacturer: [place]   Lot: [id number]   [place]: [id number]

## 2019-07-01 DIAGNOSIS — E7849 Other hyperlipidemia: Secondary | ICD-10-CM | POA: Diagnosis not present

## 2019-07-01 DIAGNOSIS — Z0000 Encounter for general adult medical examination without abnormal findings: Secondary | ICD-10-CM | POA: Diagnosis not present

## 2019-07-01 DIAGNOSIS — M859 Disorder of bone density and structure, unspecified: Secondary | ICD-10-CM | POA: Diagnosis not present

## 2019-07-04 ENCOUNTER — Ambulatory Visit: Payer: Medicare Other | Attending: Internal Medicine

## 2019-07-04 DIAGNOSIS — Z23 Encounter for immunization: Secondary | ICD-10-CM

## 2019-07-04 NOTE — Progress Notes (Signed)
   Covid-19 Vaccination Clinic  Name:  CAMYIA ERTMAN    MRN: [id number] DOB: 1946/01/23  07/04/2019  Ms. Altheide was observed post Covid-19 immunization for 15 minutes without incidence. She was provided with Vaccine Information Sheet and instruction to access the V-Safe system.   Ms. Rheams was instructed to call 911 with any severe reactions post vaccine: Marland Kitchen Difficulty breathing  . Swelling of your face and throat  . A fast heartbeat  . A bad rash all over your body  . Dizziness and weakness    Immunizations Administered    Name Date Dose VIS Date Route   Pfizer COVID-19 Vaccine 07/04/2019 11:35 AM 0.3 mL 05/02/2019 Intramuscular   Manufacturer: [place]   Lot: [id number]   [place]: [id number]

## 2019-07-08 DIAGNOSIS — Z0000 Encounter for general adult medical examination without abnormal findings: Secondary | ICD-10-CM | POA: Diagnosis not present

## 2019-07-08 DIAGNOSIS — W57XXXA Bitten or stung by nonvenomous insect and other nonvenomous arthropods, initial encounter: Secondary | ICD-10-CM | POA: Diagnosis not present

## 2019-07-08 DIAGNOSIS — R82998 Other abnormal findings in urine: Secondary | ICD-10-CM | POA: Diagnosis not present

## 2019-07-08 DIAGNOSIS — Z1331 Encounter for screening for depression: Secondary | ICD-10-CM | POA: Diagnosis not present

## 2019-07-08 DIAGNOSIS — I8390 Asymptomatic varicose veins of unspecified lower extremity: Secondary | ICD-10-CM | POA: Diagnosis not present

## 2019-07-08 DIAGNOSIS — R1310 Dysphagia, unspecified: Secondary | ICD-10-CM | POA: Diagnosis not present

## 2019-07-08 DIAGNOSIS — M8580 Other specified disorders of bone density and structure, unspecified site: Secondary | ICD-10-CM | POA: Diagnosis not present

## 2019-07-16 DIAGNOSIS — Z1212 Encounter for screening for malignant neoplasm of rectum: Secondary | ICD-10-CM | POA: Diagnosis not present

## 2019-07-25 DIAGNOSIS — H5213 Myopia, bilateral: Secondary | ICD-10-CM | POA: Diagnosis not present

## 2019-08-04 DIAGNOSIS — Z1283 Encounter for screening for malignant neoplasm of skin: Secondary | ICD-10-CM | POA: Diagnosis not present

## 2019-08-04 DIAGNOSIS — L820 Inflamed seborrheic keratosis: Secondary | ICD-10-CM | POA: Diagnosis not present

## 2019-08-28 ENCOUNTER — Other Ambulatory Visit: Payer: Self-pay | Admitting: *Deleted

## 2019-08-28 DIAGNOSIS — I83813 Varicose veins of bilateral lower extremities with pain: Secondary | ICD-10-CM

## 2019-09-04 ENCOUNTER — Ambulatory Visit (HOSPITAL_COMMUNITY)
Admission: RE | Admit: 2019-09-04 | Discharge: 2019-09-04 | Disposition: A | Discharge status: Home / Self Care | Payer: Medicare Other | Source: Ambulatory Visit | Attending: Vascular Surgery | Admitting: Vascular Surgery

## 2019-09-04 ENCOUNTER — Other Ambulatory Visit: Payer: Self-pay

## 2019-09-04 ENCOUNTER — Encounter: Payer: Self-pay | Admitting: Vascular Surgery

## 2019-09-04 ENCOUNTER — Ambulatory Visit (INDEPENDENT_AMBULATORY_CARE_PROVIDER_SITE_OTHER): Payer: Medicare Other | Admitting: Vascular Surgery

## 2019-09-04 VITALS — BP 120/73 | HR 73 | Temp 97.7°F | Resp 16 | Ht 62.0 in | Wt 152.0 lb

## 2019-09-04 DIAGNOSIS — I8393 Asymptomatic varicose veins of bilateral lower extremities: Secondary | ICD-10-CM

## 2019-09-04 DIAGNOSIS — I83813 Varicose veins of bilateral lower extremities with pain: Secondary | ICD-10-CM | POA: Diagnosis not present

## 2019-09-04 DIAGNOSIS — I872 Venous insufficiency (chronic) (peripheral): Secondary | ICD-10-CM | POA: Diagnosis not present

## 2019-09-04 NOTE — Progress Notes (Signed)
REASON FOR CONSULT:    Edema and varicose veins.  The consult is requested by Dr. Velna Hatchet.   ASSESSMENT & PLAN:   CHRONIC VENOUS INSUFFICIENCY WITH VENOUS HYPERTENSION: This patient describes significant aching pain and heaviness in both legs which is aggravated by standing.  Her symptoms are relieved with elevation.  This is consistent with venous hypertension.  She does not have any evidence of arterial insufficiency.  We have discussed the importance of intermittent leg elevation and the proper positioning for this.  I have recommended initially knee-high compression stockings with a gradient of 15 to 20 mmHg.  If these are not successful we could consider thigh-high 20-30 stockings.  I have also encouraged her to avoid prolonged sitting and standing.  We have discussed the importance of exercise specifically walking and water aerobics.  In addition we discussed the importance of maintaining a healthy weight.  If her symptoms progress and are not relieved with conservative measures I think she could be a candidate for laser ablation of the anterior accessory saphenous vein on the right.  I also explained that venous disease tends to be progressive and in the future she may develop new issues that we could potentially address.   [name], MD Office: [phone]   HPI:   Diane Ingram is a pleasant 74 y.o. female, who was referred with leg swelling and varicose veins.  I have reviewed the records from the referring office.  The patient was seen on 07/08/2019.  This was for an annual physical exam.  She is referred for valuation of leg swelling and venous disease.  The patient has had previous sclerotherapy in the past and also laser ablation of the right great saphenous vein I believe by [facility].  She is had no previous history of DVT.  She does occasionally wear knee-high compression stockings but finds these somewhat uncomfortable.  I am not sure that the one she has  fit correctly.  She elevates her legs some of this does help with her symptoms.  She requires ibuprofen for pains at times.  Her symptoms are worse when she is walking upstairs and her legs become very heavy.  I do not get any classic history of claudication or rest pain.  She has no real risk factors for peripheral vascular disease.  She denies any history of diabetes, hypertension, hypercholesterolemia, family history of premature cardiovascular disease or tobacco use.  Past Medical History:  Diagnosis Date  . Cancer [facility]) April 2003   right Breast  . Diverticulosis   . Hyperlipidemia   . Leg pain, bilateral   . Varicose veins     Family History  Problem Relation Age of Onset  . Cancer Mother        breast  . Cancer Father        melanoma    SOCIAL HISTORY: Social History   Socioeconomic History  . Marital status: Divorced    Spouse name: Not on file  . Number of children: 2  . Years of education: Busn. Sch.  . Highest education level: Not on file  Occupational History  . Occupation: Sports administrator    Employer: [employer]  Tobacco Use  . Smoking status: Never Smoker  . Smokeless tobacco: Never Used  Substance and Sexual Activity  . Alcohol use: No    Alcohol/week: 0.0 standard drinks  . Drug use: No  . Sexual activity: Not on file  Other Topics Concern  . Not  on file  Social History Narrative   Pt lives at home alone.   She does use caffeine.   Social Determinants of Health   Financial Resource Strain:   . Difficulty of Paying Living Expenses:   Food Insecurity:   . Worried About Charity fundraiser in the Last Year:   . Arboriculturist in the Last Year:   Transportation Needs:   . Film/video editor (Medical):   Marland Kitchen Lack of Transportation (Non-Medical):   Physical Activity:   . Days of Exercise per Week:   . Minutes of Exercise per Session:   Stress:   . Feeling of Stress :   Social Connections:   . Frequency of Communication with Friends and  Family:   . Frequency of Social Gatherings with Friends and Family:   . Attends Religious Services:   . Active Member of Clubs or Organizations:   . Attends Archivist Meetings:   Marland Kitchen Marital Status:   Intimate Partner Violence:   . Fear of Current or Ex-Partner:   . Emotionally Abused:   Marland Kitchen Physically Abused:   . Sexually Abused:     No Known Allergies  Current Outpatient Medications  Medication Sig Dispense Refill  . atorvastatin (LIPITOR) 20 MG tablet Once daily  2  . raloxifene (EVISTA) 60 MG tablet TAKE 1 TABLET (60 MG TOTAL) BY MOUTH DAILY. 30 tablet 9  . predniSONE (STERAPRED UNI-PAK 21 TAB) 10 MG (21) TBPK tablet   0   No current facility-administered medications for this visit.    REVIEW OF SYSTEMS:  [X]  denotes positive finding, [ ]  denotes negative finding Cardiac  Comments:  Chest pain or chest pressure:    Shortness of breath upon exertion:    Short of breath when lying flat:    Irregular heart rhythm:        Vascular    Pain in calf, thigh, or hip brought on by ambulation: x   Pain in feet at night that wakes you up from your sleep:     Blood clot in your veins:    Leg swelling:         Pulmonary    Oxygen at home:    Productive cough:     Wheezing:         Neurologic    Sudden weakness in arms or legs:     Sudden numbness in arms or legs:     Sudden onset of difficulty speaking or slurred speech:    Temporary loss of vision in one eye:     Problems with dizziness:         Gastrointestinal    Blood in stool:     Vomited blood:         Genitourinary    Burning when urinating:     Blood in urine:        Psychiatric    Major depression:         Hematologic    Bleeding problems:    Problems with blood clotting too easily:        Skin    Rashes or ulcers:        Constitutional    Fever or chills:     PHYSICAL EXAM:   Vitals:   09/04/19 1301  BP: 120/73  Pulse: 73  Resp: 16  Temp: 97.7 F (36.5 C)  TempSrc: Temporal    SpO2: 99%  Weight: 152 lb (68.9 kg)  Height: 5\' 2"  (1.575  m)    GENERAL: The patient is a well-nourished female, in no acute distress. The vital signs are documented above. CARDIAC: There is a regular rate and rhythm.  VASCULAR: I do not detect carotid bruits. She has palpable femoral and popliteal pulses bilaterally.  She has a left dorsalis pedis pulse.  She has a biphasic dorsalis pedis and posterior tibial signal bilaterally. She has mild bilateral lower extremity swelling and some mild hyperpigmentation.  She has some varicose veins in her knees and spider veins and telangiectasias in her lower legs.     I did look at the anterior sensory saphenous veins and small saphenous veins bilaterally.  The only vein potentially amenable to laser ablation at this point would be the anterior sensory saphenous vein on the right. PULMONARY: There is good air exchange bilaterally without wheezing or rales. ABDOMEN: Soft and non-tender with normal pitched bowel sounds.  MUSCULOSKELETAL: There are no major deformities or cyanosis. NEUROLOGIC: No focal weakness or paresthesias are detected. SKIN: There are no ulcers or rashes noted. PSYCHIATRIC: The patient has a normal affect.  DATA:    VENOUS DUPLEX: I have independently interpreted her venous duplex scan today.  On the right side there is no evidence of DVT or superficial venous thrombosis.  There is deep venous reflux involving the common femoral vein.  The right great saphenous vein has been ablated.  There is no reflux in the right small saphenous vein.   On the left side, there is no evidence of DVT or superficial venous thrombosis.  There is no reflux in the great saphenous vein.  There is reflux in the small saphenous vein at the popliteal fossa only.  There is an anterior accessory saphenous vein without significant reflux.

## 2019-10-02 DIAGNOSIS — K046 Periapical abscess with sinus: Secondary | ICD-10-CM | POA: Diagnosis not present

## 2019-10-02 DIAGNOSIS — K045 Chronic apical periodontitis: Secondary | ICD-10-CM | POA: Diagnosis not present

## 2019-11-10 DIAGNOSIS — J0100 Acute maxillary sinusitis, unspecified: Secondary | ICD-10-CM | POA: Diagnosis not present

## 2019-12-12 DIAGNOSIS — R21 Rash and other nonspecific skin eruption: Secondary | ICD-10-CM | POA: Diagnosis not present

## 2019-12-12 DIAGNOSIS — B029 Zoster without complications: Secondary | ICD-10-CM | POA: Diagnosis not present

## 2019-12-29 ENCOUNTER — Other Ambulatory Visit: Payer: Self-pay | Admitting: Internal Medicine

## 2019-12-29 DIAGNOSIS — R911 Solitary pulmonary nodule: Secondary | ICD-10-CM

## 2019-12-29 DIAGNOSIS — K7689 Other specified diseases of liver: Secondary | ICD-10-CM

## 2019-12-29 DIAGNOSIS — N281 Cyst of kidney, acquired: Secondary | ICD-10-CM

## 2019-12-29 DIAGNOSIS — C50919 Malignant neoplasm of unspecified site of unspecified female breast: Secondary | ICD-10-CM

## 2020-01-05 DIAGNOSIS — B880 Other acariasis: Secondary | ICD-10-CM | POA: Diagnosis not present

## 2020-01-07 ENCOUNTER — Ambulatory Visit
Admission: RE | Admit: 2020-01-07 | Discharge: 2020-01-07 | Disposition: A | Discharge status: Home / Self Care | Payer: Medicare Other | Source: Ambulatory Visit | Attending: Internal Medicine | Admitting: Internal Medicine

## 2020-01-07 DIAGNOSIS — M47814 Spondylosis without myelopathy or radiculopathy, thoracic region: Secondary | ICD-10-CM | POA: Diagnosis not present

## 2020-01-07 DIAGNOSIS — N281 Cyst of kidney, acquired: Secondary | ICD-10-CM

## 2020-01-07 DIAGNOSIS — M4184 Other forms of scoliosis, thoracic region: Secondary | ICD-10-CM | POA: Diagnosis not present

## 2020-01-07 DIAGNOSIS — R911 Solitary pulmonary nodule: Secondary | ICD-10-CM

## 2020-01-07 DIAGNOSIS — K7689 Other specified diseases of liver: Secondary | ICD-10-CM

## 2020-01-07 DIAGNOSIS — I700 Atherosclerosis of aorta: Secondary | ICD-10-CM | POA: Diagnosis not present

## 2020-01-07 DIAGNOSIS — C50919 Malignant neoplasm of unspecified site of unspecified female breast: Secondary | ICD-10-CM

## 2020-01-07 DIAGNOSIS — M4186 Other forms of scoliosis, lumbar region: Secondary | ICD-10-CM | POA: Diagnosis not present

## 2020-01-07 IMAGING — CT CT ABD-PELV W/O CM
2 of 4 series · 14 of 46 positions shown, 16 images · non-contrast
Comparison: Chest abdomen pelvis CT 12/31/2018. chest CT dating
back to 3375 reviewed.

CLINICAL DATA: Follow-up pulmonary nodules. Follow-up renal cysts.
History of right breast cancer. Bilateral mastectomy.

EXAM:
CT CHEST, ABDOMEN AND PELVIS WITHOUT CONTRAST
TECHNIQUE: Multidetector CT imaging of the chest, abdomen and pelvis was
performed following the standard protocol without IV contrast.

[Series 2: cap 5.00 br40 s3 · axial · 0.79mm/px · z∈[+1253,+1758]mm · 11 of 119 slices shown, 13 images]
[im 9/119  soft-tissue]
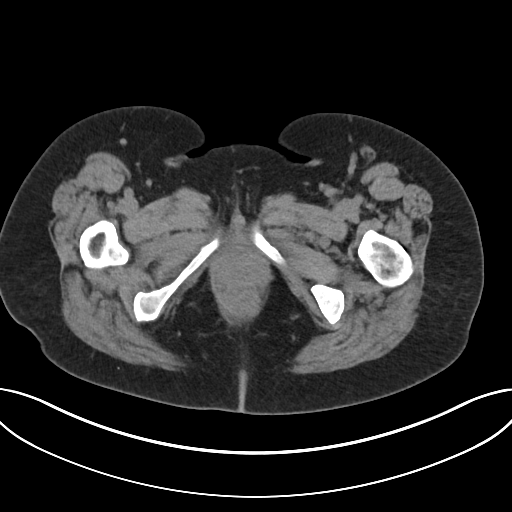
[im 9/119  bone]
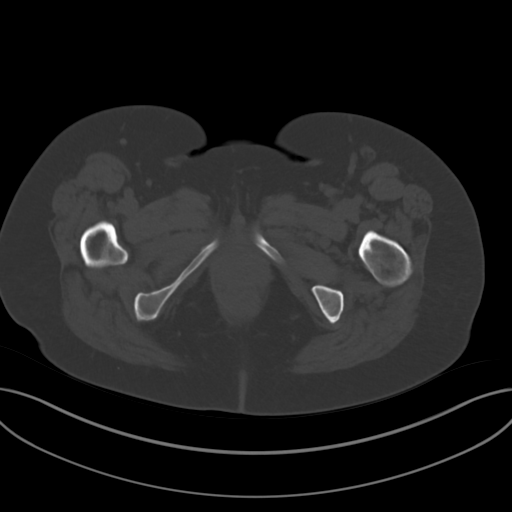
[im 17/119  soft-tissue]
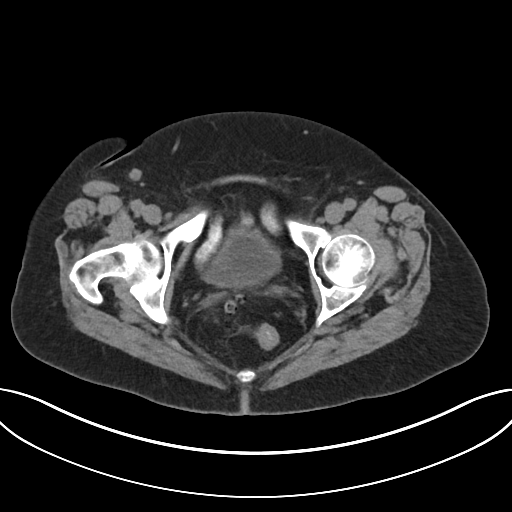
[im 26/119  soft-tissue]
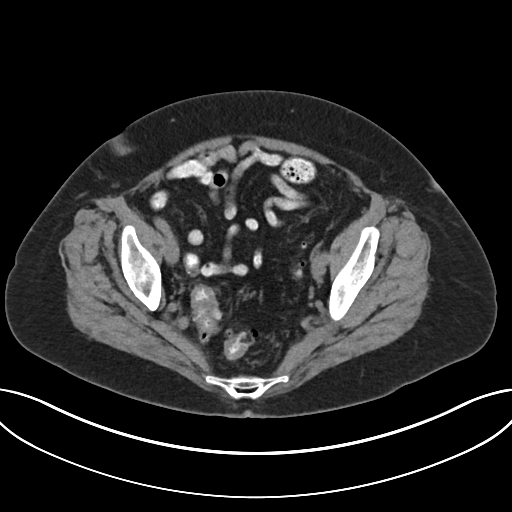
[im 43/119  soft-tissue]
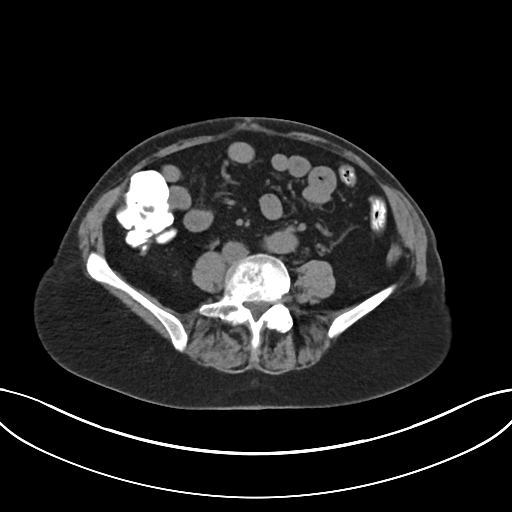
[im 51/119  soft-tissue]
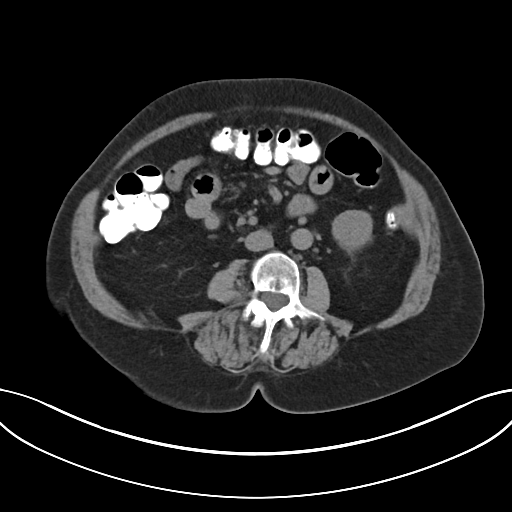
[im 60/119  soft-tissue]
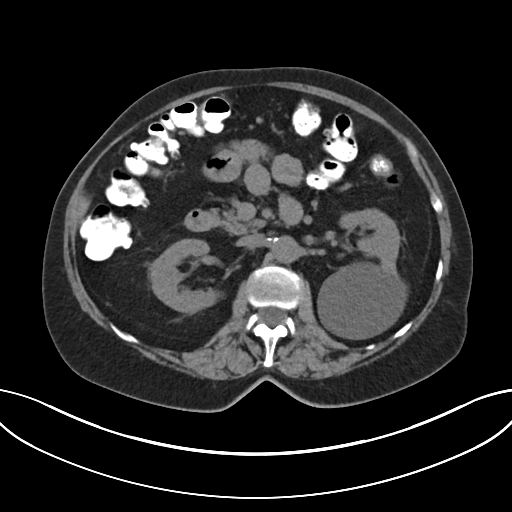
[im 68/119  soft-tissue]
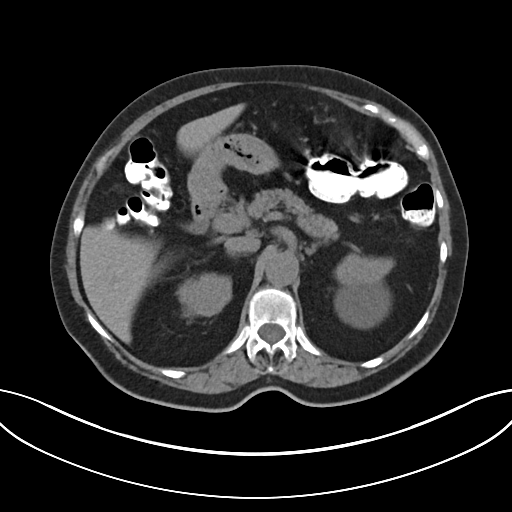
[im 76/119  soft-tissue]
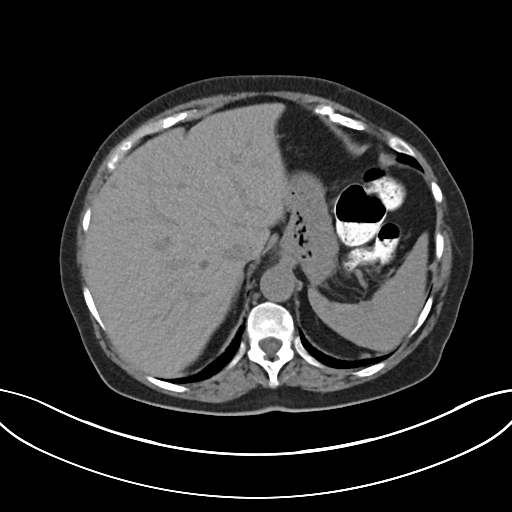
[im 93/119  soft-tissue]
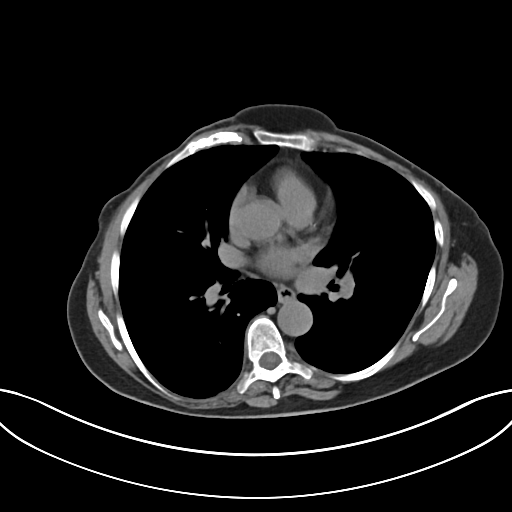
[im 93/119  bone]
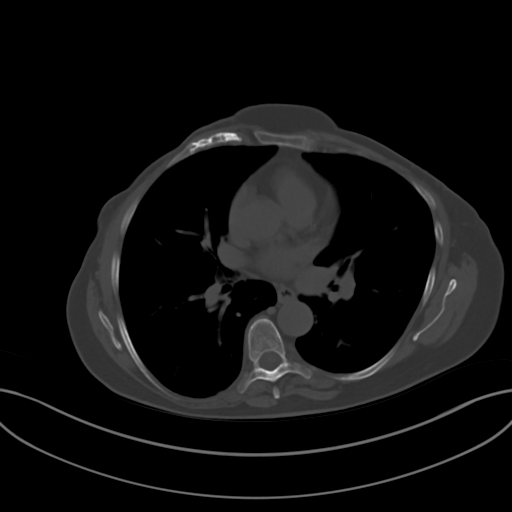
[im 102/119  soft-tissue]
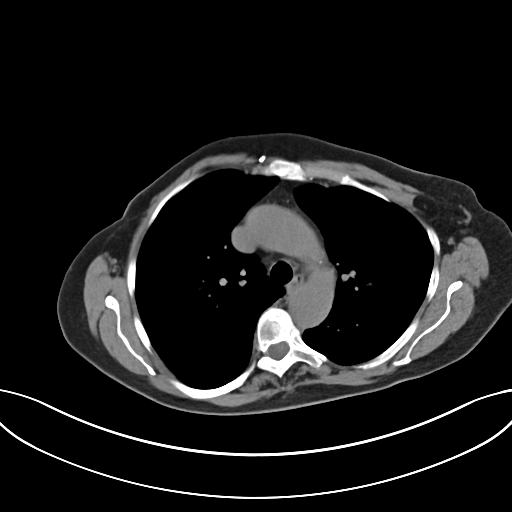
[im 110/119  soft-tissue]
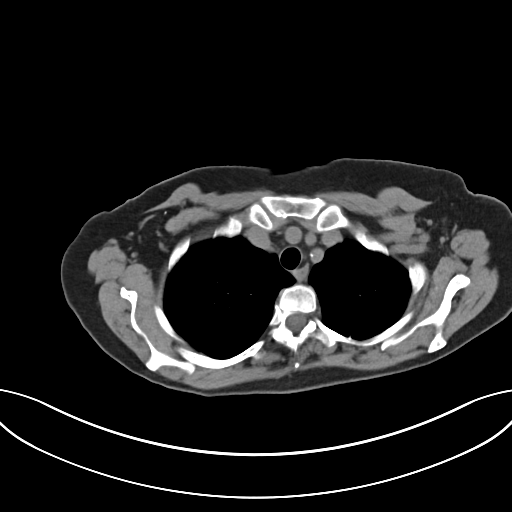

[Series 6: cap 2.00 br40 s3 · coronal · 0.79mm/px · 3 of 199 slices shown]
[im 67/199  soft-tissue]
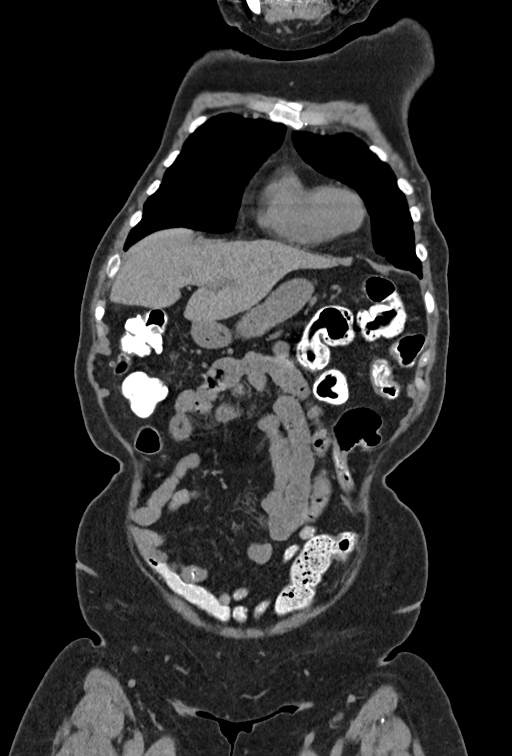
[im 89/199  soft-tissue]
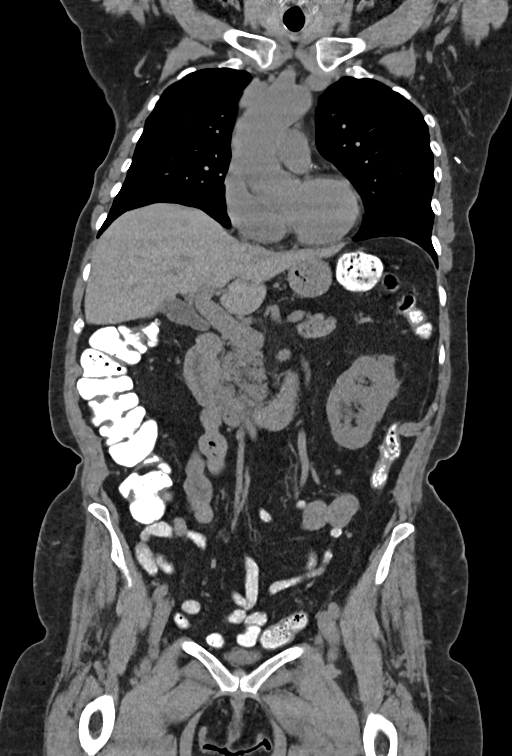
[im 111/199  soft-tissue]
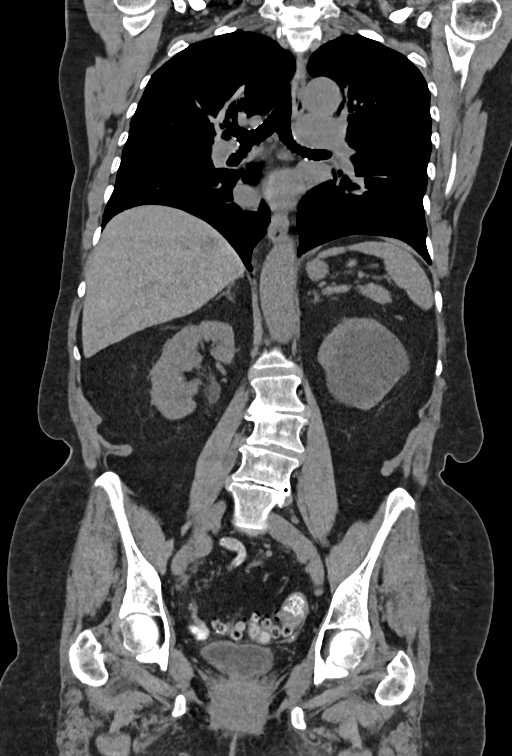

[14 of 46 positions shown; findings below may reference images not displayed]

FINDINGS: CT CHEST FINDINGS

Cardiovascular: Minimal aortic atherosclerosis. No aortic aneurysm.
Mild aortic tortuosity, stable. The heart is normal in size. No
pericardial effusion.

Mediastinum/Nodes: No enlarged mediastinal lymph nodes. No enlarged
axillary lymph nodes. No evidence of hilar adenopathy on noncontrast
exam. The esophagus is decompressed without wall thickening. No
thyroid nodule.

Lungs/Pleura: Right apical ground-glass opacity measuring
approximately 9 mild mid there is, series 4 image 23, unchanged from
prior exam. No solid component. Stable bilateral small pulmonary
nodules all less than 5 mm. On the right this includes small nodules
on images 64 (2 nodules same slice), 75, and 80. On the left this
includes images 28, 37, 71, and 82. There is no new pulmonary
nodule. No acute or focal airspace disease. No pleural fluid. The
trachea and central bronchi are patent.

Musculoskeletal: No focal bone lesion or acute osseous abnormality.
Scoliosis and degenerative change in the thoracic spine. Stable
prominent Schmorl's node superior endplate of T12. Bilateral
mastectomy.

CT ABDOMEN PELVIS FINDINGS

Hepatobiliary: Scattered low-density lesions in the liver, stable
from prior exam, some of these less well-defined in the absence of
IV contrast on the current exam. No evidence of new hepatic lesion.
Gallbladder physiologically distended, no calcified stone. No
biliary dilatation.

Pancreas: No ductal dilatation or inflammation.

Spleen: Normal in size without focal abnormality.

Adrenals/Urinary Tract: Normal adrenal glands without adrenal
nodule. Simple left renal cyst measuring 7.4 cm. No change from
prior exam. No evidence of septation or nodularity. No evidence of
solid renal lesion in either kidney. There is no hydronephrosis. No
renal calculi. The urinary bladder is partially distended.

Stomach/Bowel: The stomach is unremarkable. There is no small bowel
obstruction or wall thickening. Administered enteric contrast is
seen within distal small bowel and colon. Normal appendix.
Diverticulosis involving in the distal colon without diverticulitis.

Vascular/Lymphatic: Minor aortic atherosclerosis without aneurysm.
There are no enlarged lymph nodes in the abdomen or pelvis.

Reproductive: Status post hysterectomy. No adnexal masses.

Other: No free fluid or focal fluid collection. No evidence of
peritoneal disease. No subcutaneous lesion. No body wall hernia.

Musculoskeletal: No focal bone lesion. Scoliosis and degenerative
change in the lumbar spine.
IMPRESSION: 1. Stable bilateral small pulmonary nodules, all less than 5 mm. No
new or suspicious pulmonary nodules. These have been stable dating
back to 9854 and considered benign. No dedicated follow-up is needed
of these nodules.
2. Stable 9 mm ground-glass opacity at the right lung apex to 9854
and likely 3375 allowing for differences in slice selection.
Recommend CT follow-up in 2 years to ensure 5 year stability.
3. Stable left renal cyst without concerning imaging features. No
dedicated follow-up is needed. No evidence of suspicious renal
lesion.
4. Colonic diverticulosis without diverticulitis.
5. No findings to suggest metastatic disease in the chest, abdomen,
or pelvis.

Aortic Atherosclerosis (7M37R-VBI.I).

## 2020-01-07 IMAGING — CT CT CHEST W/O CM
2 of 4 series · 14 of 46 positions shown, 16 images · non-contrast
Comparison: Chest abdomen pelvis CT 12/31/2018. chest CT dating
back to 3375 reviewed.

CLINICAL DATA: Follow-up pulmonary nodules. Follow-up renal cysts.
History of right breast cancer. Bilateral mastectomy.

EXAM:
CT CHEST, ABDOMEN AND PELVIS WITHOUT CONTRAST
TECHNIQUE: Multidetector CT imaging of the chest, abdomen and pelvis was
performed following the standard protocol without IV contrast.

[Series 2: cap 5.00 br40 s3 · axial · 0.79mm/px · z∈[+1253,+1758]mm · 11 of 119 slices shown, 13 images]
[im 9/119  soft-tissue]
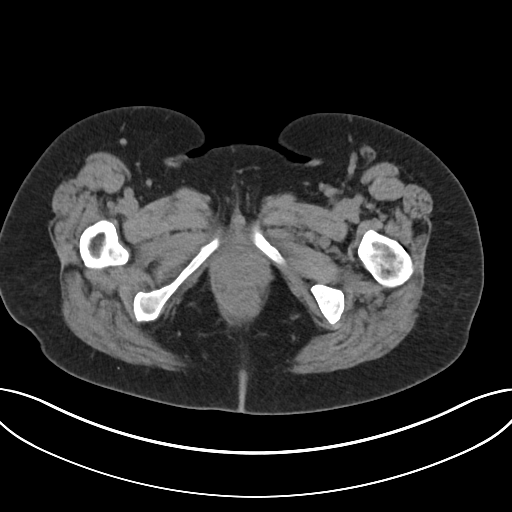
[im 9/119  bone]
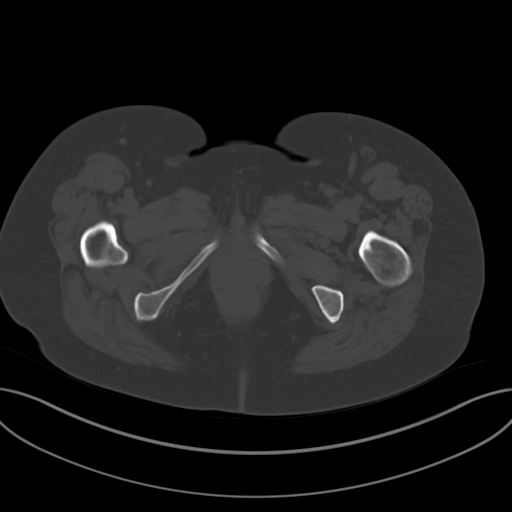
[im 17/119  soft-tissue]
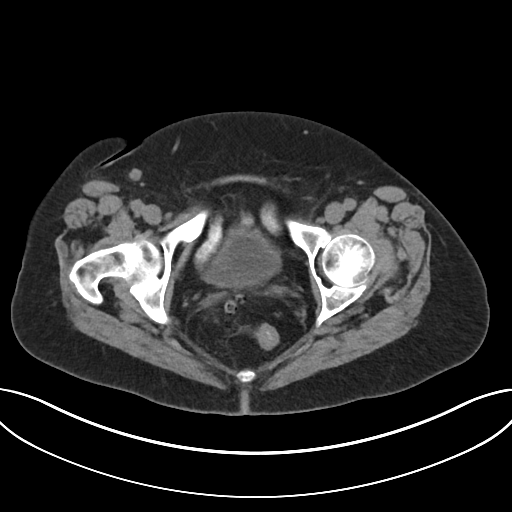
[im 26/119  soft-tissue]
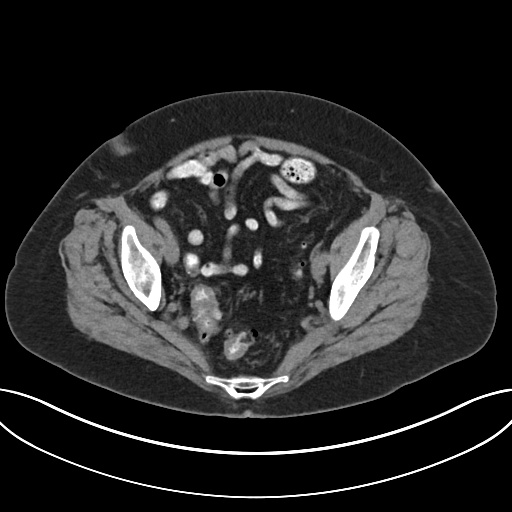
[im 43/119  soft-tissue]
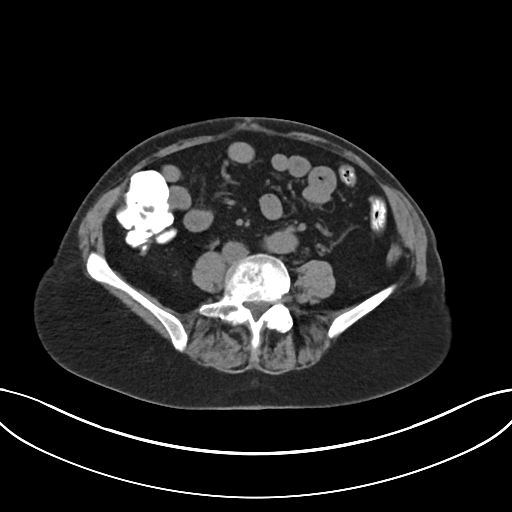
[im 51/119  soft-tissue]
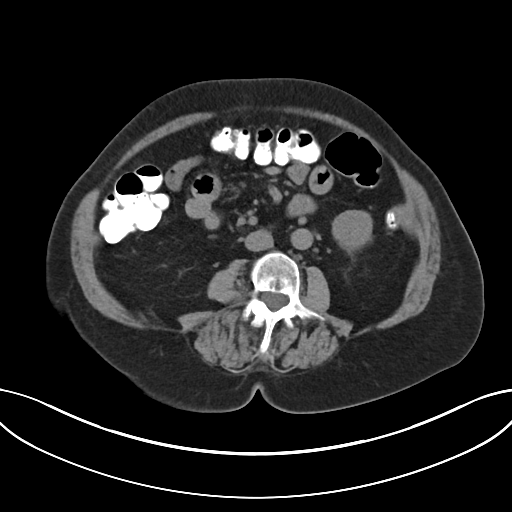
[im 60/119  soft-tissue]
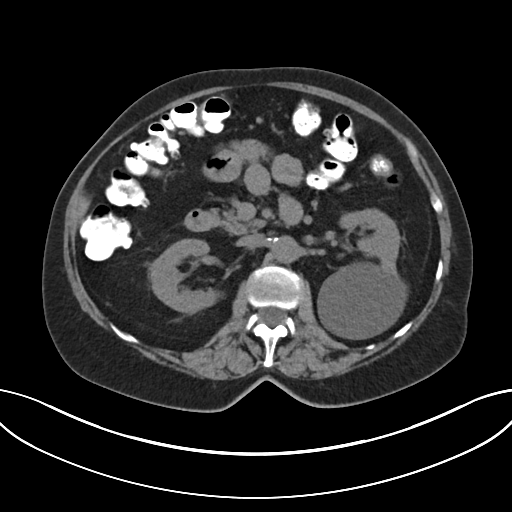
[im 68/119  soft-tissue]
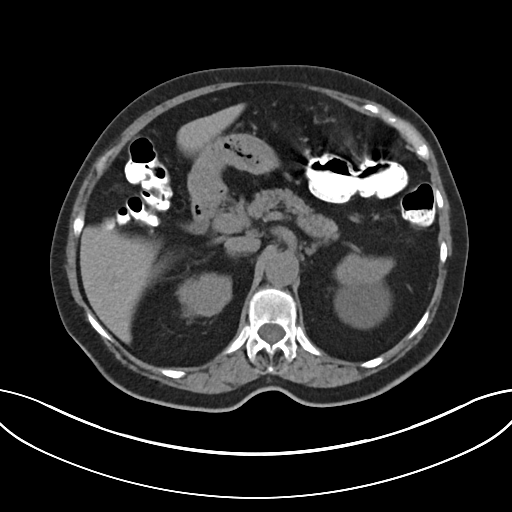
[im 76/119  soft-tissue]
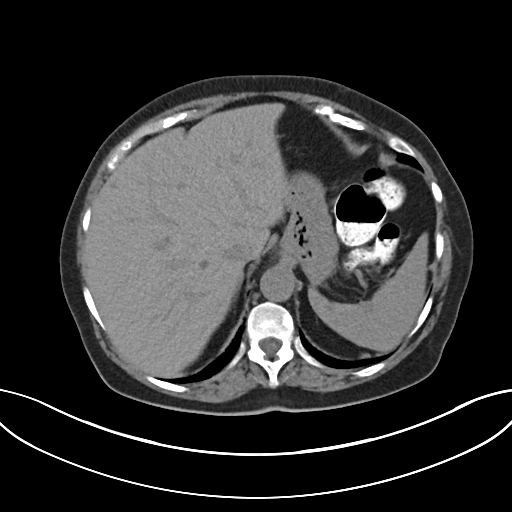
[im 93/119  soft-tissue]
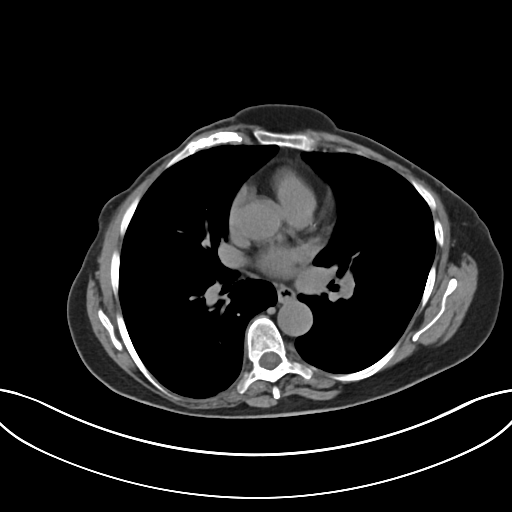
[im 93/119  bone]
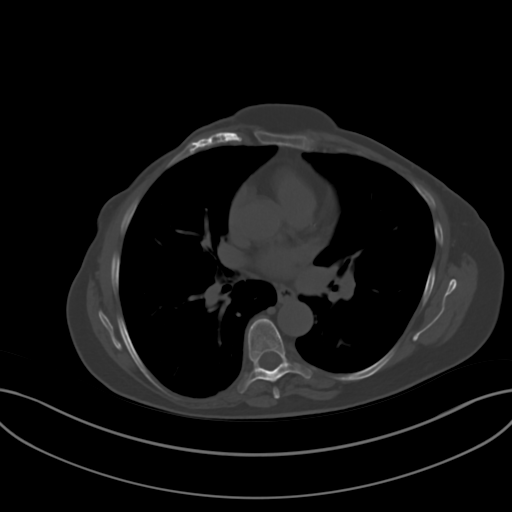
[im 102/119  soft-tissue]
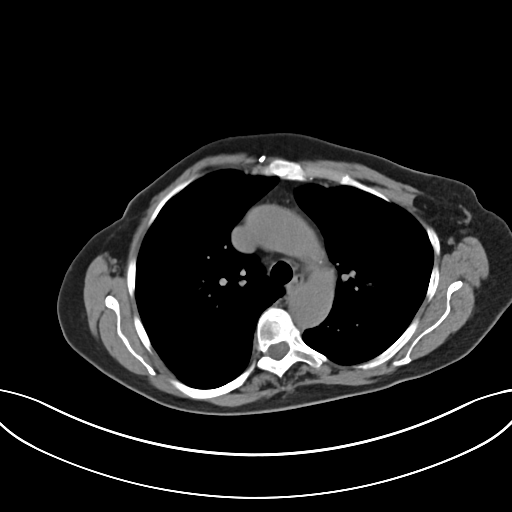
[im 110/119  soft-tissue]
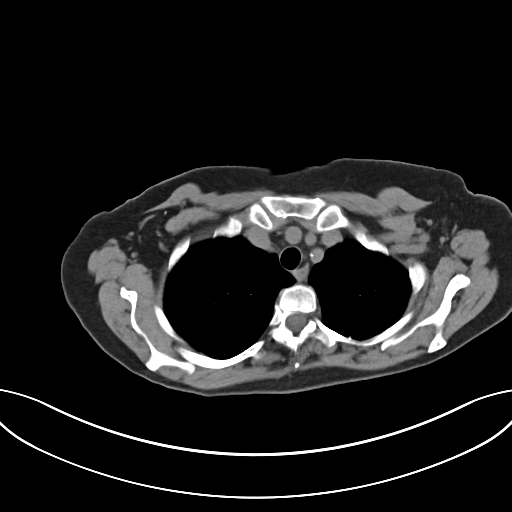

[Series 6: cap 2.00 br40 s3 · coronal · 0.79mm/px · 3 of 199 slices shown]
[im 67/199  soft-tissue]
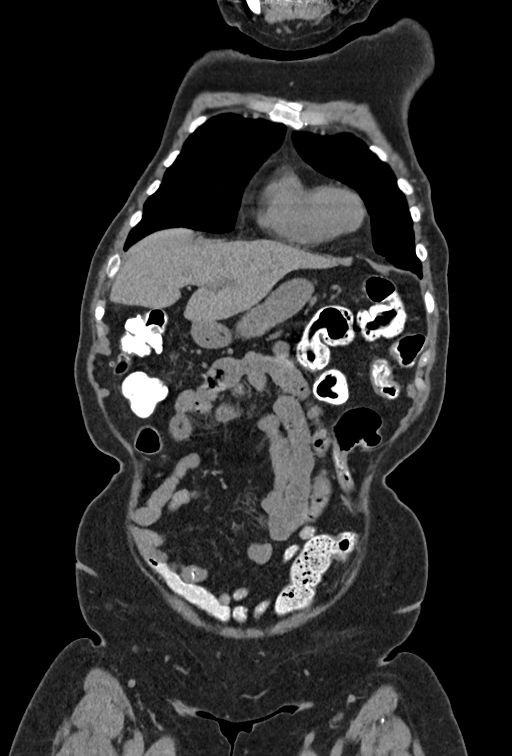
[im 89/199  soft-tissue]
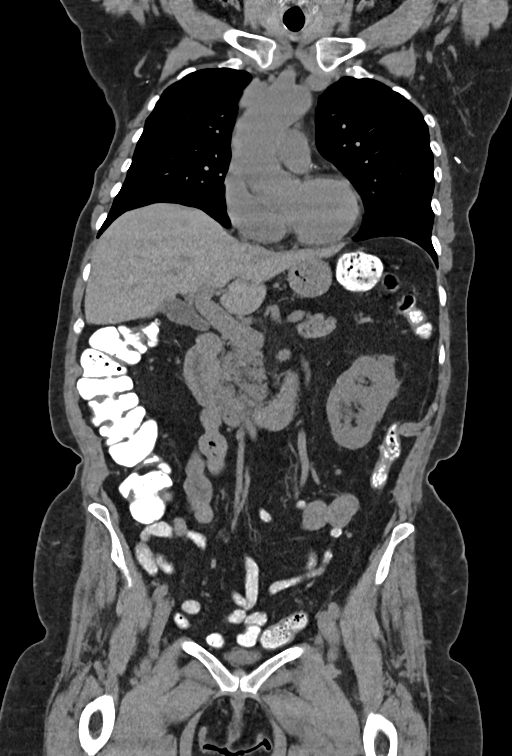
[im 111/199  soft-tissue]
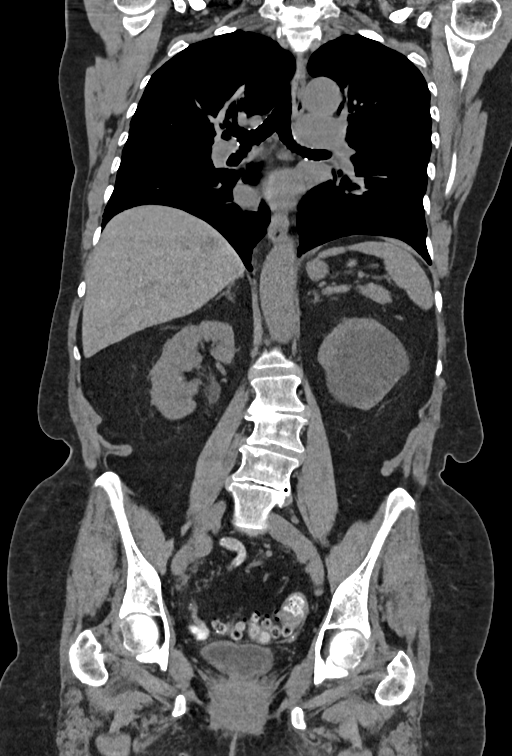

[14 of 46 positions shown; findings below may reference images not displayed]

FINDINGS: CT CHEST FINDINGS

Cardiovascular: Minimal aortic atherosclerosis. No aortic aneurysm.
Mild aortic tortuosity, stable. The heart is normal in size. No
pericardial effusion.

Mediastinum/Nodes: No enlarged mediastinal lymph nodes. No enlarged
axillary lymph nodes. No evidence of hilar adenopathy on noncontrast
exam. The esophagus is decompressed without wall thickening. No
thyroid nodule.

Lungs/Pleura: Right apical ground-glass opacity measuring
approximately 9 mild mid there is, series 4 image 23, unchanged from
prior exam. No solid component. Stable bilateral small pulmonary
nodules all less than 5 mm. On the right this includes small nodules
on images 64 (2 nodules same slice), 75, and 80. On the left this
includes images 28, 37, 71, and 82. There is no new pulmonary
nodule. No acute or focal airspace disease. No pleural fluid. The
trachea and central bronchi are patent.

Musculoskeletal: No focal bone lesion or acute osseous abnormality.
Scoliosis and degenerative change in the thoracic spine. Stable
prominent Schmorl's node superior endplate of T12. Bilateral
mastectomy.

CT ABDOMEN PELVIS FINDINGS

Hepatobiliary: Scattered low-density lesions in the liver, stable
from prior exam, some of these less well-defined in the absence of
IV contrast on the current exam. No evidence of new hepatic lesion.
Gallbladder physiologically distended, no calcified stone. No
biliary dilatation.

Pancreas: No ductal dilatation or inflammation.

Spleen: Normal in size without focal abnormality.

Adrenals/Urinary Tract: Normal adrenal glands without adrenal
nodule. Simple left renal cyst measuring 7.4 cm. No change from
prior exam. No evidence of septation or nodularity. No evidence of
solid renal lesion in either kidney. There is no hydronephrosis. No
renal calculi. The urinary bladder is partially distended.

Stomach/Bowel: The stomach is unremarkable. There is no small bowel
obstruction or wall thickening. Administered enteric contrast is
seen within distal small bowel and colon. Normal appendix.
Diverticulosis involving in the distal colon without diverticulitis.

Vascular/Lymphatic: Minor aortic atherosclerosis without aneurysm.
There are no enlarged lymph nodes in the abdomen or pelvis.

Reproductive: Status post hysterectomy. No adnexal masses.

Other: No free fluid or focal fluid collection. No evidence of
peritoneal disease. No subcutaneous lesion. No body wall hernia.

Musculoskeletal: No focal bone lesion. Scoliosis and degenerative
change in the lumbar spine.
IMPRESSION: 1. Stable bilateral small pulmonary nodules, all less than 5 mm. No
new or suspicious pulmonary nodules. These have been stable dating
back to 9854 and considered benign. No dedicated follow-up is needed
of these nodules.
2. Stable 9 mm ground-glass opacity at the right lung apex to 9854
and likely 3375 allowing for differences in slice selection.
Recommend CT follow-up in 2 years to ensure 5 year stability.
3. Stable left renal cyst without concerning imaging features. No
dedicated follow-up is needed. No evidence of suspicious renal
lesion.
4. Colonic diverticulosis without diverticulitis.
5. No findings to suggest metastatic disease in the chest, abdomen,
or pelvis.

Aortic Atherosclerosis (7M37R-VBI.I).

## 2020-01-20 DIAGNOSIS — S80861A Insect bite (nonvenomous), right lower leg, initial encounter: Secondary | ICD-10-CM | POA: Diagnosis not present

## 2020-01-20 DIAGNOSIS — X32XXXD Exposure to sunlight, subsequent encounter: Secondary | ICD-10-CM | POA: Diagnosis not present

## 2020-01-20 DIAGNOSIS — M792 Neuralgia and neuritis, unspecified: Secondary | ICD-10-CM | POA: Diagnosis not present

## 2020-01-20 DIAGNOSIS — L570 Actinic keratosis: Secondary | ICD-10-CM | POA: Diagnosis not present

## 2020-02-28 DIAGNOSIS — Z23 Encounter for immunization: Secondary | ICD-10-CM | POA: Diagnosis not present

## 2020-05-14 DIAGNOSIS — Z20822 Contact with and (suspected) exposure to covid-19: Secondary | ICD-10-CM | POA: Diagnosis not present

## 2020-07-05 DIAGNOSIS — B078 Other viral warts: Secondary | ICD-10-CM | POA: Diagnosis not present

## 2020-07-05 DIAGNOSIS — M792 Neuralgia and neuritis, unspecified: Secondary | ICD-10-CM | POA: Diagnosis not present

## 2020-07-05 DIAGNOSIS — L821 Other seborrheic keratosis: Secondary | ICD-10-CM | POA: Diagnosis not present

## 2020-07-07 DIAGNOSIS — E785 Hyperlipidemia, unspecified: Secondary | ICD-10-CM | POA: Diagnosis not present

## 2020-07-07 DIAGNOSIS — E559 Vitamin D deficiency, unspecified: Secondary | ICD-10-CM | POA: Diagnosis not present

## 2020-07-14 DIAGNOSIS — R82998 Other abnormal findings in urine: Secondary | ICD-10-CM | POA: Diagnosis not present

## 2020-07-19 DIAGNOSIS — Z1212 Encounter for screening for malignant neoplasm of rectum: Secondary | ICD-10-CM | POA: Diagnosis not present

## 2020-08-04 DIAGNOSIS — H5203 Hypermetropia, bilateral: Secondary | ICD-10-CM | POA: Diagnosis not present

## 2020-08-25 DIAGNOSIS — H5213 Myopia, bilateral: Secondary | ICD-10-CM | POA: Diagnosis not present

## 2020-08-31 DIAGNOSIS — M25562 Pain in left knee: Secondary | ICD-10-CM | POA: Diagnosis not present

## 2020-08-31 DIAGNOSIS — M25561 Pain in right knee: Secondary | ICD-10-CM | POA: Diagnosis not present

## 2020-09-01 DIAGNOSIS — M6281 Muscle weakness (generalized): Secondary | ICD-10-CM | POA: Diagnosis not present

## 2020-09-07 DIAGNOSIS — M6281 Muscle weakness (generalized): Secondary | ICD-10-CM | POA: Diagnosis not present

## 2020-09-09 DIAGNOSIS — M25521 Pain in right elbow: Secondary | ICD-10-CM | POA: Diagnosis not present

## 2020-09-09 DIAGNOSIS — M7021 Olecranon bursitis, right elbow: Secondary | ICD-10-CM | POA: Diagnosis not present

## 2020-09-13 DIAGNOSIS — M7021 Olecranon bursitis, right elbow: Secondary | ICD-10-CM | POA: Diagnosis not present

## 2020-09-13 DIAGNOSIS — M6281 Muscle weakness (generalized): Secondary | ICD-10-CM | POA: Diagnosis not present

## 2020-09-16 DIAGNOSIS — M6281 Muscle weakness (generalized): Secondary | ICD-10-CM | POA: Diagnosis not present

## 2020-09-29 DIAGNOSIS — M6281 Muscle weakness (generalized): Secondary | ICD-10-CM | POA: Diagnosis not present

## 2020-09-30 DIAGNOSIS — S80261A Insect bite (nonvenomous), right knee, initial encounter: Secondary | ICD-10-CM | POA: Diagnosis not present

## 2020-09-30 DIAGNOSIS — D225 Melanocytic nevi of trunk: Secondary | ICD-10-CM | POA: Diagnosis not present

## 2020-10-28 DIAGNOSIS — U071 COVID-19: Secondary | ICD-10-CM | POA: Diagnosis not present

## 2020-11-10 DIAGNOSIS — U071 COVID-19: Secondary | ICD-10-CM | POA: Diagnosis not present

## 2020-11-10 DIAGNOSIS — R5383 Other fatigue: Secondary | ICD-10-CM | POA: Diagnosis not present

## 2020-11-10 DIAGNOSIS — N281 Cyst of kidney, acquired: Secondary | ICD-10-CM | POA: Diagnosis not present

## 2020-11-10 DIAGNOSIS — J069 Acute upper respiratory infection, unspecified: Secondary | ICD-10-CM | POA: Diagnosis not present

## 2020-12-16 ENCOUNTER — Other Ambulatory Visit: Payer: Self-pay | Admitting: Registered Nurse

## 2020-12-17 ENCOUNTER — Other Ambulatory Visit: Payer: Self-pay | Admitting: Registered Nurse

## 2020-12-17 DIAGNOSIS — N281 Cyst of kidney, acquired: Secondary | ICD-10-CM

## 2020-12-17 DIAGNOSIS — R059 Cough, unspecified: Secondary | ICD-10-CM

## 2020-12-28 ENCOUNTER — Other Ambulatory Visit: Payer: Self-pay

## 2020-12-28 ENCOUNTER — Ambulatory Visit
Admission: RE | Admit: 2020-12-28 | Discharge: 2020-12-28 | Disposition: A | Discharge status: Home / Self Care | Payer: Medicare Other | Source: Ambulatory Visit | Attending: Registered Nurse | Admitting: Registered Nurse

## 2020-12-28 DIAGNOSIS — R059 Cough, unspecified: Secondary | ICD-10-CM

## 2020-12-28 DIAGNOSIS — N281 Cyst of kidney, acquired: Secondary | ICD-10-CM

## 2020-12-28 DIAGNOSIS — K5730 Diverticulosis of large intestine without perforation or abscess without bleeding: Secondary | ICD-10-CM | POA: Diagnosis not present

## 2020-12-28 DIAGNOSIS — Z853 Personal history of malignant neoplasm of breast: Secondary | ICD-10-CM | POA: Diagnosis not present

## 2020-12-28 DIAGNOSIS — K7689 Other specified diseases of liver: Secondary | ICD-10-CM | POA: Diagnosis not present

## 2020-12-28 IMAGING — CT CT CHEST W/ CM
1 series · 1 of 1 positions shown · IV contrast (iopamidol)
Comparison: 01/07/2020

CLINICAL DATA: Cyst of kidney. Bilateral mastectomy. History of
right breast cancer. Follow-up pulmonary nodule and renal cyst.

EXAM:
CT CHEST, ABDOMEN, AND PELVIS WITH CONTRAST
TECHNIQUE: Multidetector CT imaging of the chest, abdomen and pelvis was
performed following the standard protocol during bolus
administration of intravenous contrast.
CONTRAST:  80mL 1AIBJ2-FTS IOPAMIDOL (1AIBJ2-FTS) INJECTION 76%

[Series 1: topogram 0.70 tr20 · coronal · 1.50mm/px · 1 of 1 slices shown]
[im 1/1]
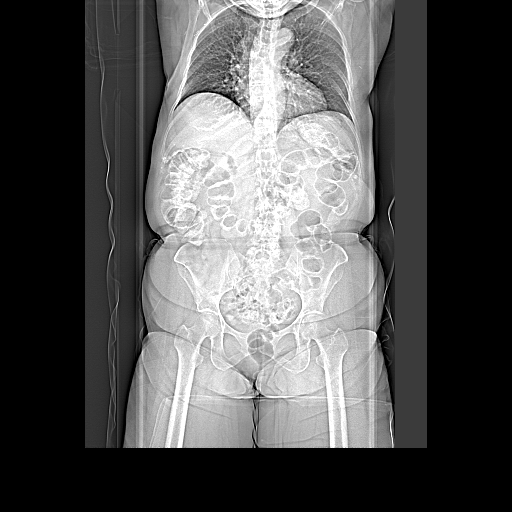

[1 of 1 positions shown; findings below may reference images not displayed]

FINDINGS: CT CHEST FINDINGS

Cardiovascular: Heart size is normal. No visible coronary artery
calcification. No pleural effusion. No aortic atherosclerotic
calcification.

Mediastinum/Nodes: No mass or lymphadenopathy.

Lungs/Pleura: Stable 1 cm region of ground-glass opacity in the
right upper lobe axial image 23. No developing solid component.
Stable small bilateral pulmonary nodules as seen previously, all
less than 5 mm. These include nodules in the right lung image 65 and
66, medial right lung image 76 and medial right lung image 82. In
the left, nodules are stable as seen on image twenty-two, 30 for, 69
and 82. Small nodule at the left base image 89 is also stable.

Musculoskeletal: Ordinary spinal curvature and degenerative change.

CT ABDOMEN PELVIS FINDINGS

Hepatobiliary: Multiple liver cysts are stable, better seen with
contrast administration. No calcified gallstones. No worrisome liver
finding.

Pancreas: Normal

Spleen: Normal

Adrenals/Urinary Tract: Adrenal glands are normal. Right kidney is
normal. Simple cyst of the dorsal left kidney with maximal axial
dimension 7.5 cm, increased from 7 cm previously. No obstruction.
Bladder appears normal.

Stomach/Bowel: Stomach and small intestine are normal.
Diverticulosis of the colon without evidence of diverticulitis.

Vascular/Lymphatic: Aortic atherosclerosis. No aneurysm. IVC is
normal.

Reproductive: Previous hysterectomy.  No pelvic mass.

Other: No free fluid or air.  No evidence of abdominal wall hernia.

Musculoskeletal: Curvature and chronic degenerative changes of the
lumbar spine.
IMPRESSION: Multiple bilateral small pulmonary nodules less than 5 mm stable,
compared to 1112, without further follow-up for these findings.

Stable 1 cm ground-glass opacity at the right apex compared with
1112. Probably no change since 0811, allowing for different slice
positioning. Additional follow-up in 1 year recommended to ensure 5
year stability.

Left renal cyst, enlarged slightly since the previous study, maximal
axial dimension 7.5 cm compared with 7 cm last year. Maximal
dimension in the coronal plane increased from 8.7 cm to 8.9 cm. No
worrisome features.

Aortic Atherosclerosis (I4RZ0-4TS.S).

## 2020-12-28 IMAGING — CT CT ABD-PELV W/ CM
2 of 5 series · 13 of 46 positions shown, 15 images · IV contrast (iopamidol)
Comparison: 01/07/2020

CLINICAL DATA: Cyst of kidney. Bilateral mastectomy. History of
right breast cancer. Follow-up pulmonary nodule and renal cyst.

EXAM:
CT CHEST, ABDOMEN, AND PELVIS WITH CONTRAST
TECHNIQUE: Multidetector CT imaging of the chest, abdomen and pelvis was
performed following the standard protocol during bolus
administration of intravenous contrast.
CONTRAST:  80mL 1AIBJ2-FTS IOPAMIDOL (1AIBJ2-FTS) INJECTION 76%

[Series 2: cap with 5.00 br40 s3 axial · axial · 0.68mm/px · z∈[+1348,+1843]mm · 10 of 119 slices shown, 12 images]
[im 10/119  soft-tissue]
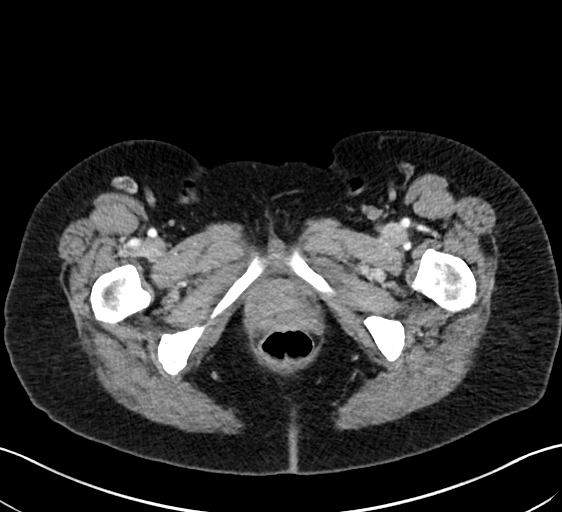
[im 10/119  bone]
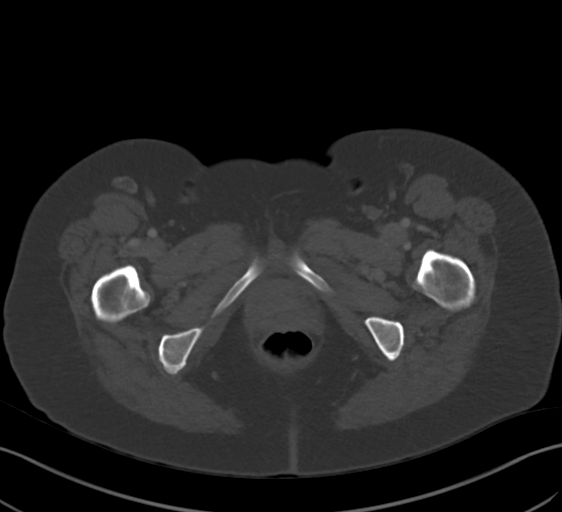
[im 19/119  soft-tissue]
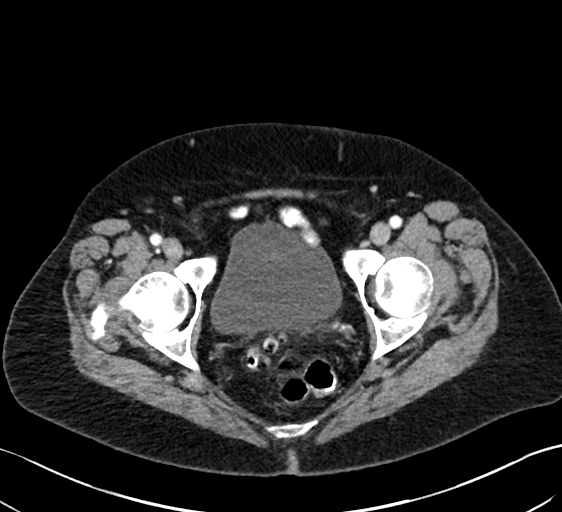
[im 37/119  soft-tissue]
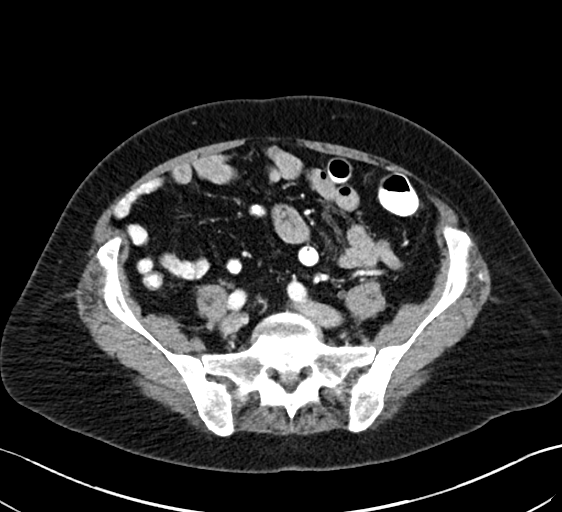
[im 46/119  soft-tissue]
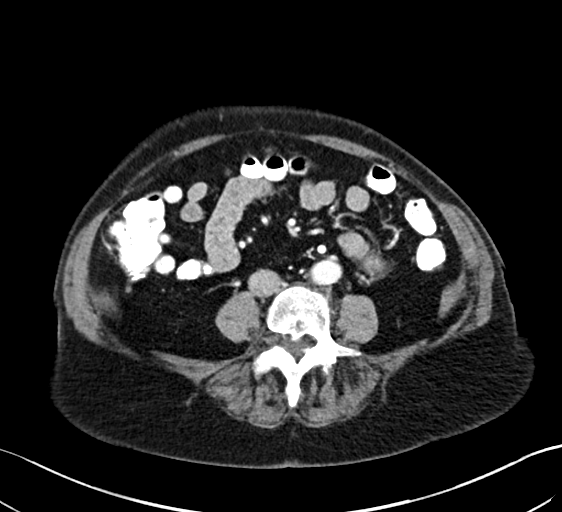
[im 55/119  soft-tissue]
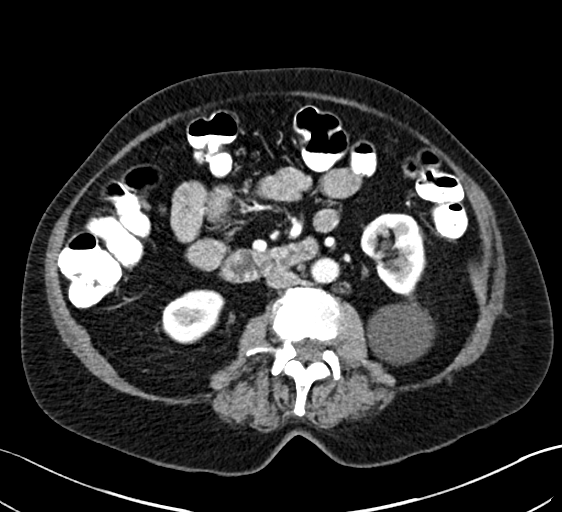
[im 64/119  soft-tissue]
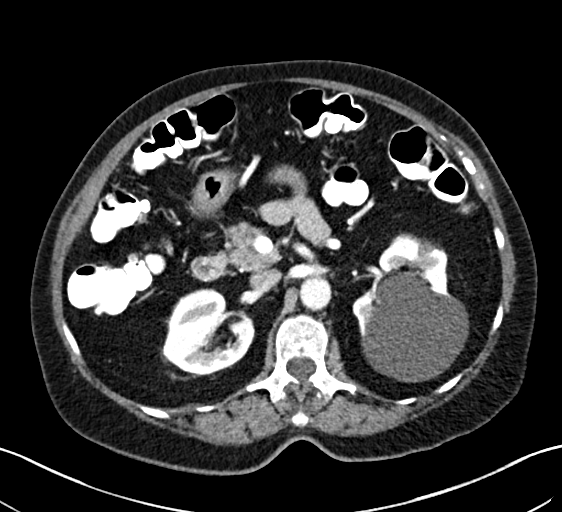
[im 73/119  soft-tissue]
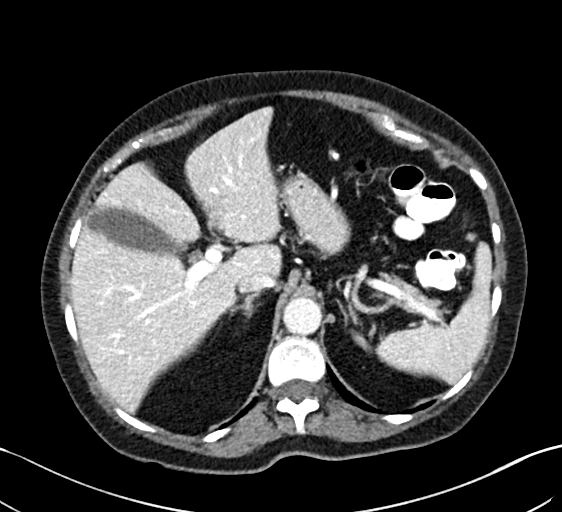
[im 91/119  soft-tissue]
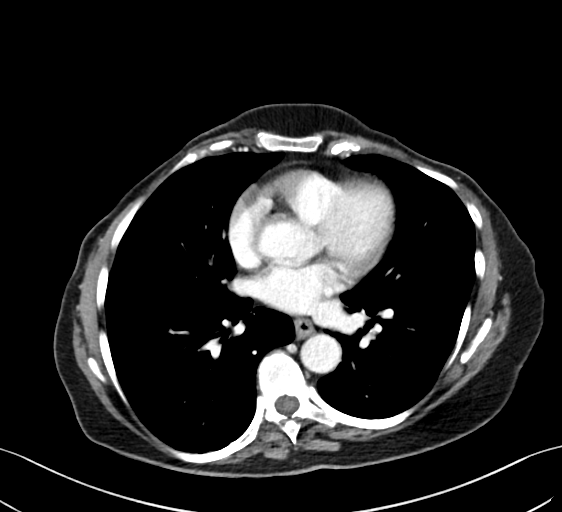
[im 100/119  soft-tissue]
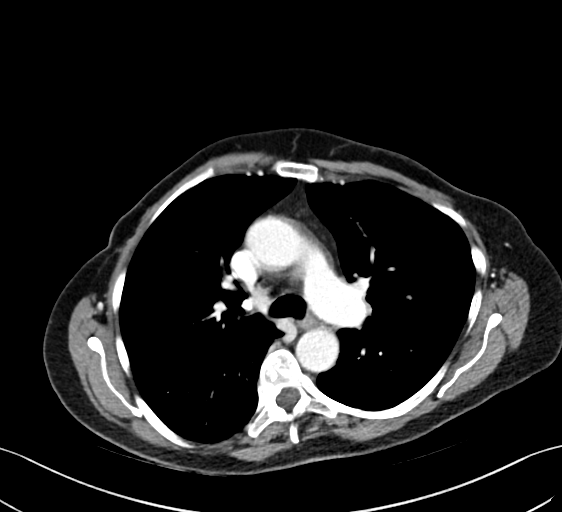
[im 100/119  bone]
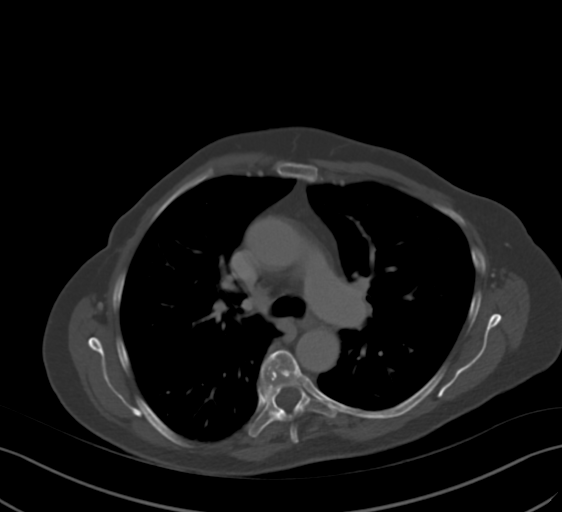
[im 109/119  soft-tissue]
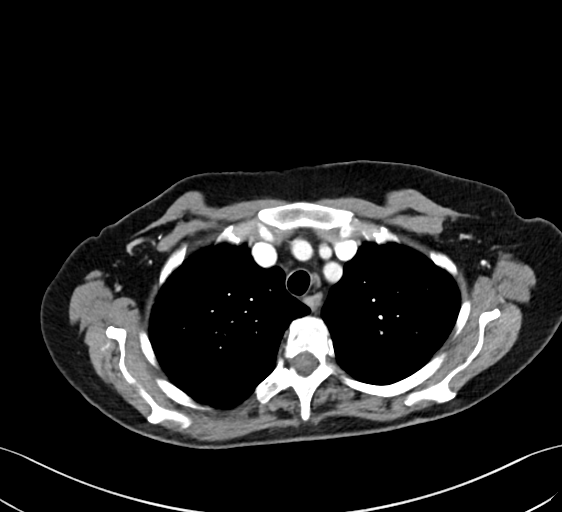

[Series 6: cap with 2.00 br40 s3 cor · coronal · 0.74mm/px · 3 of 169 slices shown]
[im 57/169  soft-tissue]
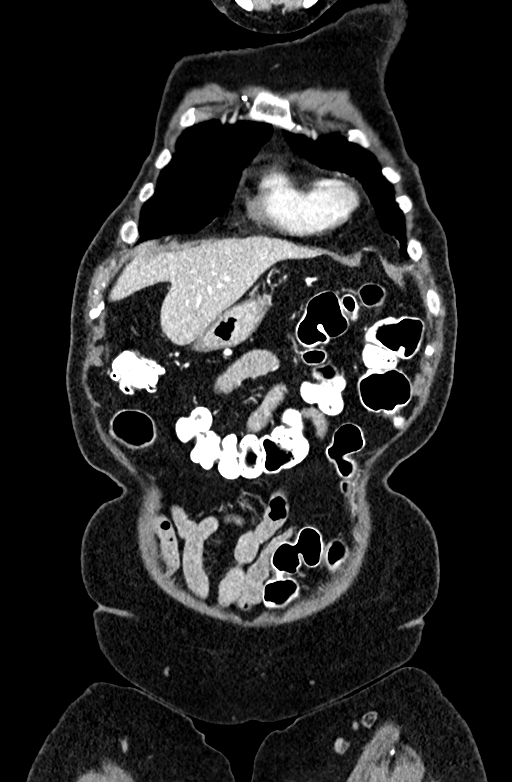
[im 75/169  soft-tissue]
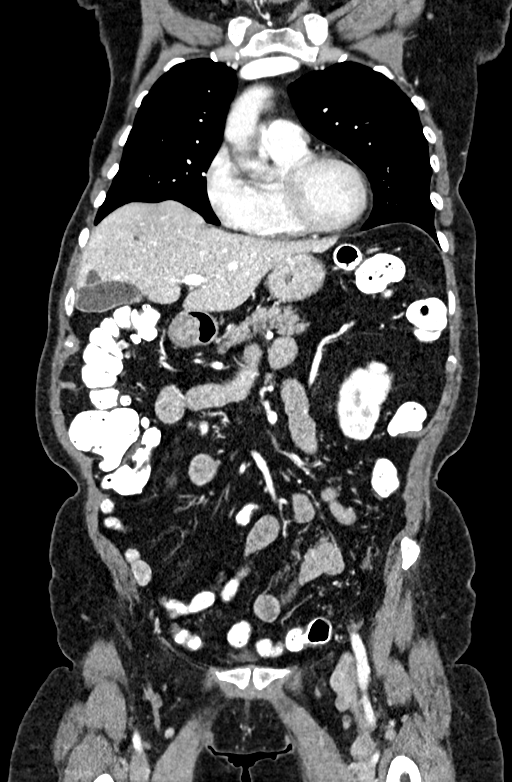
[im 94/169  soft-tissue]
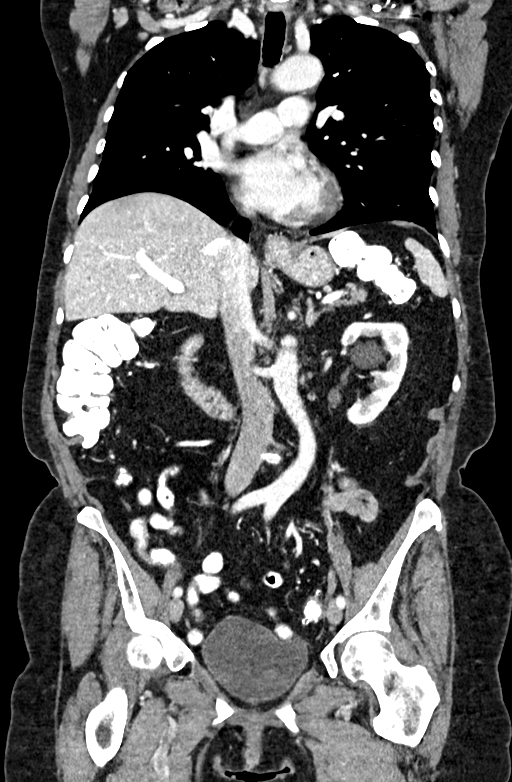

[13 of 46 positions shown; findings below may reference images not displayed]

FINDINGS: CT CHEST FINDINGS

Cardiovascular: Heart size is normal. No visible coronary artery
calcification. No pleural effusion. No aortic atherosclerotic
calcification.

Mediastinum/Nodes: No mass or lymphadenopathy.

Lungs/Pleura: Stable 1 cm region of ground-glass opacity in the
right upper lobe axial image 23. No developing solid component.
Stable small bilateral pulmonary nodules as seen previously, all
less than 5 mm. These include nodules in the right lung image 65 and
66, medial right lung image 76 and medial right lung image 82. In
the left, nodules are stable as seen on image twenty-two, 30 for, 69
and 82. Small nodule at the left base image 89 is also stable.

Musculoskeletal: Ordinary spinal curvature and degenerative change.

CT ABDOMEN PELVIS FINDINGS

Hepatobiliary: Multiple liver cysts are stable, better seen with
contrast administration. No calcified gallstones. No worrisome liver
finding.

Pancreas: Normal

Spleen: Normal

Adrenals/Urinary Tract: Adrenal glands are normal. Right kidney is
normal. Simple cyst of the dorsal left kidney with maximal axial
dimension 7.5 cm, increased from 7 cm previously. No obstruction.
Bladder appears normal.

Stomach/Bowel: Stomach and small intestine are normal.
Diverticulosis of the colon without evidence of diverticulitis.

Vascular/Lymphatic: Aortic atherosclerosis. No aneurysm. IVC is
normal.

Reproductive: Previous hysterectomy.  No pelvic mass.

Other: No free fluid or air.  No evidence of abdominal wall hernia.

Musculoskeletal: Curvature and chronic degenerative changes of the
lumbar spine.
IMPRESSION: Multiple bilateral small pulmonary nodules less than 5 mm stable,
compared to 1112, without further follow-up for these findings.

Stable 1 cm ground-glass opacity at the right apex compared with
1112. Probably no change since 0811, allowing for different slice
positioning. Additional follow-up in 1 year recommended to ensure 5
year stability.

Left renal cyst, enlarged slightly since the previous study, maximal
axial dimension 7.5 cm compared with 7 cm last year. Maximal
dimension in the coronal plane increased from 8.7 cm to 8.9 cm. No
worrisome features.

Aortic Atherosclerosis (I4RZ0-4TS.S).

## 2020-12-28 MED ORDER — IOPAMIDOL (ISOVUE-370) INJECTION 76%
80.0000 mL | Freq: Once | INTRAVENOUS | Status: AC | PRN
  Administered 2020-12-28: 80 mL via INTRAVENOUS

## 2021-01-20 DIAGNOSIS — S90562A Insect bite (nonvenomous), left ankle, initial encounter: Secondary | ICD-10-CM | POA: Diagnosis not present

## 2021-01-20 DIAGNOSIS — M25572 Pain in left ankle and joints of left foot: Secondary | ICD-10-CM | POA: Diagnosis not present

## 2021-01-20 DIAGNOSIS — S93492A Sprain of other ligament of left ankle, initial encounter: Secondary | ICD-10-CM | POA: Diagnosis not present

## 2021-01-20 DIAGNOSIS — W57XXXA Bitten or stung by nonvenomous insect and other nonvenomous arthropods, initial encounter: Secondary | ICD-10-CM | POA: Diagnosis not present

## 2021-01-25 DIAGNOSIS — M25572 Pain in left ankle and joints of left foot: Secondary | ICD-10-CM | POA: Diagnosis not present

## 2021-02-02 DIAGNOSIS — M25572 Pain in left ankle and joints of left foot: Secondary | ICD-10-CM | POA: Diagnosis not present

## 2021-03-12 DIAGNOSIS — Z23 Encounter for immunization: Secondary | ICD-10-CM | POA: Diagnosis not present

## 2021-06-14 DIAGNOSIS — R195 Other fecal abnormalities: Secondary | ICD-10-CM | POA: Diagnosis not present

## 2021-06-14 DIAGNOSIS — Z1211 Encounter for screening for malignant neoplasm of colon: Secondary | ICD-10-CM | POA: Diagnosis not present

## 2021-07-05 DIAGNOSIS — J40 Bronchitis, not specified as acute or chronic: Secondary | ICD-10-CM | POA: Diagnosis not present

## 2021-07-05 DIAGNOSIS — R051 Acute cough: Secondary | ICD-10-CM | POA: Diagnosis not present

## 2021-07-05 DIAGNOSIS — R5383 Other fatigue: Secondary | ICD-10-CM | POA: Diagnosis not present

## 2021-07-05 DIAGNOSIS — R0981 Nasal congestion: Secondary | ICD-10-CM | POA: Diagnosis not present

## 2021-07-13 DIAGNOSIS — Z1211 Encounter for screening for malignant neoplasm of colon: Secondary | ICD-10-CM | POA: Diagnosis not present

## 2021-07-21 DIAGNOSIS — E559 Vitamin D deficiency, unspecified: Secondary | ICD-10-CM | POA: Diagnosis not present

## 2021-07-21 DIAGNOSIS — E785 Hyperlipidemia, unspecified: Secondary | ICD-10-CM | POA: Diagnosis not present

## 2021-07-26 DIAGNOSIS — N281 Cyst of kidney, acquired: Secondary | ICD-10-CM | POA: Diagnosis not present

## 2021-07-26 DIAGNOSIS — R82998 Other abnormal findings in urine: Secondary | ICD-10-CM | POA: Diagnosis not present

## 2021-07-26 DIAGNOSIS — E785 Hyperlipidemia, unspecified: Secondary | ICD-10-CM | POA: Diagnosis not present

## 2021-08-09 DIAGNOSIS — H5213 Myopia, bilateral: Secondary | ICD-10-CM | POA: Diagnosis not present

## 2021-08-10 DIAGNOSIS — H5201 Hypermetropia, right eye: Secondary | ICD-10-CM | POA: Diagnosis not present

## 2021-08-10 DIAGNOSIS — H2513 Age-related nuclear cataract, bilateral: Secondary | ICD-10-CM | POA: Diagnosis not present

## 2021-08-10 DIAGNOSIS — H52223 Regular astigmatism, bilateral: Secondary | ICD-10-CM | POA: Diagnosis not present

## 2021-08-10 DIAGNOSIS — H524 Presbyopia: Secondary | ICD-10-CM | POA: Diagnosis not present

## 2021-08-19 DIAGNOSIS — H5213 Myopia, bilateral: Secondary | ICD-10-CM | POA: Diagnosis not present

## 2021-08-30 DIAGNOSIS — M8589 Other specified disorders of bone density and structure, multiple sites: Secondary | ICD-10-CM | POA: Diagnosis not present

## 2021-09-07 DIAGNOSIS — Z1283 Encounter for screening for malignant neoplasm of skin: Secondary | ICD-10-CM | POA: Diagnosis not present

## 2021-09-07 DIAGNOSIS — L570 Actinic keratosis: Secondary | ICD-10-CM | POA: Diagnosis not present

## 2021-09-07 DIAGNOSIS — D225 Melanocytic nevi of trunk: Secondary | ICD-10-CM | POA: Diagnosis not present

## 2021-09-07 DIAGNOSIS — L908 Other atrophic disorders of skin: Secondary | ICD-10-CM | POA: Diagnosis not present

## 2021-09-28 DIAGNOSIS — S81851A Open bite, right lower leg, initial encounter: Secondary | ICD-10-CM | POA: Diagnosis not present

## 2021-09-28 DIAGNOSIS — I872 Venous insufficiency (chronic) (peripheral): Secondary | ICD-10-CM | POA: Diagnosis not present

## 2021-12-29 ENCOUNTER — Other Ambulatory Visit: Payer: Self-pay | Admitting: Internal Medicine

## 2021-12-29 DIAGNOSIS — N281 Cyst of kidney, acquired: Secondary | ICD-10-CM

## 2021-12-29 DIAGNOSIS — R911 Solitary pulmonary nodule: Secondary | ICD-10-CM

## 2021-12-29 DIAGNOSIS — K7689 Other specified diseases of liver: Secondary | ICD-10-CM

## 2022-01-27 ENCOUNTER — Ambulatory Visit
Admission: RE | Admit: 2022-01-27 | Discharge: 2022-01-27 | Disposition: A | Discharge status: Home / Self Care | Payer: Medicare Other | Source: Ambulatory Visit | Attending: Internal Medicine | Admitting: Internal Medicine

## 2022-01-27 DIAGNOSIS — Z8505 Personal history of malignant neoplasm of liver: Secondary | ICD-10-CM | POA: Diagnosis not present

## 2022-01-27 DIAGNOSIS — I700 Atherosclerosis of aorta: Secondary | ICD-10-CM | POA: Diagnosis not present

## 2022-01-27 DIAGNOSIS — R918 Other nonspecific abnormal finding of lung field: Secondary | ICD-10-CM | POA: Diagnosis not present

## 2022-01-27 DIAGNOSIS — N281 Cyst of kidney, acquired: Secondary | ICD-10-CM

## 2022-01-27 DIAGNOSIS — K7689 Other specified diseases of liver: Secondary | ICD-10-CM

## 2022-01-27 DIAGNOSIS — R911 Solitary pulmonary nodule: Secondary | ICD-10-CM

## 2022-01-27 DIAGNOSIS — Z853 Personal history of malignant neoplasm of breast: Secondary | ICD-10-CM | POA: Diagnosis not present

## 2022-01-27 MED ORDER — IOPAMIDOL (ISOVUE-300) INJECTION 61%
100.0000 mL | Freq: Once | INTRAVENOUS | Status: AC | PRN
  Administered 2022-01-27: 100 mL via INTRAVENOUS

## 2022-02-25 DIAGNOSIS — Z23 Encounter for immunization: Secondary | ICD-10-CM | POA: Diagnosis not present

## 2022-06-08 DIAGNOSIS — L304 Erythema intertrigo: Secondary | ICD-10-CM | POA: Diagnosis not present

## 2022-06-08 DIAGNOSIS — L668 Other cicatricial alopecia: Secondary | ICD-10-CM | POA: Diagnosis not present

## 2022-06-08 DIAGNOSIS — L570 Actinic keratosis: Secondary | ICD-10-CM | POA: Diagnosis not present

## 2022-06-08 DIAGNOSIS — X32XXXD Exposure to sunlight, subsequent encounter: Secondary | ICD-10-CM | POA: Diagnosis not present

## 2022-06-08 DIAGNOSIS — L661 Lichen planopilaris: Secondary | ICD-10-CM | POA: Diagnosis not present

## 2022-06-08 DIAGNOSIS — D225 Melanocytic nevi of trunk: Secondary | ICD-10-CM | POA: Diagnosis not present

## 2022-06-08 DIAGNOSIS — Z1283 Encounter for screening for malignant neoplasm of skin: Secondary | ICD-10-CM | POA: Diagnosis not present

## 2022-06-21 DIAGNOSIS — L661 Lichen planopilaris: Secondary | ICD-10-CM | POA: Diagnosis not present

## 2022-06-29 DIAGNOSIS — M25511 Pain in right shoulder: Secondary | ICD-10-CM | POA: Diagnosis not present

## 2022-06-29 DIAGNOSIS — M25449 Effusion, unspecified hand: Secondary | ICD-10-CM | POA: Diagnosis not present

## 2022-06-29 DIAGNOSIS — G8929 Other chronic pain: Secondary | ICD-10-CM | POA: Diagnosis not present

## 2022-06-29 DIAGNOSIS — R21 Rash and other nonspecific skin eruption: Secondary | ICD-10-CM | POA: Diagnosis not present

## 2022-06-29 DIAGNOSIS — M25561 Pain in right knee: Secondary | ICD-10-CM | POA: Diagnosis not present

## 2022-07-06 DIAGNOSIS — M25562 Pain in left knee: Secondary | ICD-10-CM | POA: Diagnosis not present

## 2022-07-06 DIAGNOSIS — M79642 Pain in left hand: Secondary | ICD-10-CM | POA: Diagnosis not present

## 2022-07-06 DIAGNOSIS — M25561 Pain in right knee: Secondary | ICD-10-CM | POA: Diagnosis not present

## 2022-07-06 DIAGNOSIS — M25511 Pain in right shoulder: Secondary | ICD-10-CM | POA: Diagnosis not present

## 2022-07-19 DIAGNOSIS — M7541 Impingement syndrome of right shoulder: Secondary | ICD-10-CM | POA: Diagnosis not present

## 2022-07-19 DIAGNOSIS — L661 Lichen planopilaris: Secondary | ICD-10-CM | POA: Diagnosis not present

## 2022-07-19 DIAGNOSIS — M25561 Pain in right knee: Secondary | ICD-10-CM | POA: Diagnosis not present

## 2022-07-19 DIAGNOSIS — M25562 Pain in left knee: Secondary | ICD-10-CM | POA: Diagnosis not present

## 2022-07-26 DIAGNOSIS — M7541 Impingement syndrome of right shoulder: Secondary | ICD-10-CM | POA: Diagnosis not present

## 2022-07-26 DIAGNOSIS — M25561 Pain in right knee: Secondary | ICD-10-CM | POA: Diagnosis not present

## 2022-07-26 DIAGNOSIS — M25562 Pain in left knee: Secondary | ICD-10-CM | POA: Diagnosis not present

## 2022-07-31 DIAGNOSIS — M25561 Pain in right knee: Secondary | ICD-10-CM | POA: Diagnosis not present

## 2022-07-31 DIAGNOSIS — M25562 Pain in left knee: Secondary | ICD-10-CM | POA: Diagnosis not present

## 2022-07-31 DIAGNOSIS — M7541 Impingement syndrome of right shoulder: Secondary | ICD-10-CM | POA: Diagnosis not present

## 2022-08-01 DIAGNOSIS — E785 Hyperlipidemia, unspecified: Secondary | ICD-10-CM | POA: Diagnosis not present

## 2022-08-01 DIAGNOSIS — E559 Vitamin D deficiency, unspecified: Secondary | ICD-10-CM | POA: Diagnosis not present

## 2022-08-08 DIAGNOSIS — M25562 Pain in left knee: Secondary | ICD-10-CM | POA: Diagnosis not present

## 2022-08-08 DIAGNOSIS — M25561 Pain in right knee: Secondary | ICD-10-CM | POA: Diagnosis not present

## 2022-08-08 DIAGNOSIS — M7541 Impingement syndrome of right shoulder: Secondary | ICD-10-CM | POA: Diagnosis not present

## 2022-08-09 DIAGNOSIS — R413 Other amnesia: Secondary | ICD-10-CM | POA: Diagnosis not present

## 2022-08-09 DIAGNOSIS — E785 Hyperlipidemia, unspecified: Secondary | ICD-10-CM | POA: Diagnosis not present

## 2022-08-09 DIAGNOSIS — I8390 Asymptomatic varicose veins of unspecified lower extremity: Secondary | ICD-10-CM | POA: Diagnosis not present

## 2022-08-09 DIAGNOSIS — Z1331 Encounter for screening for depression: Secondary | ICD-10-CM | POA: Diagnosis not present

## 2022-08-09 DIAGNOSIS — Z0000 Encounter for general adult medical examination without abnormal findings: Secondary | ICD-10-CM | POA: Diagnosis not present

## 2022-08-09 DIAGNOSIS — Z23 Encounter for immunization: Secondary | ICD-10-CM | POA: Diagnosis not present

## 2022-08-09 DIAGNOSIS — R82998 Other abnormal findings in urine: Secondary | ICD-10-CM | POA: Diagnosis not present

## 2022-08-09 DIAGNOSIS — Z1339 Encounter for screening examination for other mental health and behavioral disorders: Secondary | ICD-10-CM | POA: Diagnosis not present

## 2022-08-09 DIAGNOSIS — C50919 Malignant neoplasm of unspecified site of unspecified female breast: Secondary | ICD-10-CM | POA: Diagnosis not present

## 2022-08-09 DIAGNOSIS — N281 Cyst of kidney, acquired: Secondary | ICD-10-CM | POA: Diagnosis not present

## 2022-08-11 DIAGNOSIS — H2513 Age-related nuclear cataract, bilateral: Secondary | ICD-10-CM | POA: Diagnosis not present

## 2022-08-16 DIAGNOSIS — L661 Lichen planopilaris: Secondary | ICD-10-CM | POA: Diagnosis not present

## 2022-08-17 DIAGNOSIS — M7541 Impingement syndrome of right shoulder: Secondary | ICD-10-CM | POA: Diagnosis not present

## 2022-08-17 DIAGNOSIS — M25561 Pain in right knee: Secondary | ICD-10-CM | POA: Diagnosis not present

## 2022-08-17 DIAGNOSIS — M25562 Pain in left knee: Secondary | ICD-10-CM | POA: Diagnosis not present

## 2022-08-21 DIAGNOSIS — M7541 Impingement syndrome of right shoulder: Secondary | ICD-10-CM | POA: Diagnosis not present

## 2022-08-21 DIAGNOSIS — M25562 Pain in left knee: Secondary | ICD-10-CM | POA: Diagnosis not present

## 2022-08-21 DIAGNOSIS — M25561 Pain in right knee: Secondary | ICD-10-CM | POA: Diagnosis not present

## 2022-08-29 DIAGNOSIS — M25562 Pain in left knee: Secondary | ICD-10-CM | POA: Diagnosis not present

## 2022-08-29 DIAGNOSIS — M7541 Impingement syndrome of right shoulder: Secondary | ICD-10-CM | POA: Diagnosis not present

## 2022-08-29 DIAGNOSIS — M25561 Pain in right knee: Secondary | ICD-10-CM | POA: Diagnosis not present

## 2022-08-30 DIAGNOSIS — M79645 Pain in left finger(s): Secondary | ICD-10-CM | POA: Diagnosis not present

## 2022-08-31 ENCOUNTER — Other Ambulatory Visit: Payer: Self-pay | Admitting: Orthopedic Surgery

## 2022-09-20 ENCOUNTER — Encounter (HOSPITAL_BASED_OUTPATIENT_CLINIC_OR_DEPARTMENT_OTHER): Payer: Self-pay | Admitting: Orthopedic Surgery

## 2022-09-20 ENCOUNTER — Other Ambulatory Visit: Payer: Self-pay

## 2022-09-26 ENCOUNTER — Encounter (HOSPITAL_BASED_OUTPATIENT_CLINIC_OR_DEPARTMENT_OTHER): Payer: Self-pay | Admitting: Orthopedic Surgery

## 2022-09-26 ENCOUNTER — Ambulatory Visit (HOSPITAL_BASED_OUTPATIENT_CLINIC_OR_DEPARTMENT_OTHER): Payer: Medicare Other | Admitting: Anesthesiology

## 2022-09-26 ENCOUNTER — Encounter (HOSPITAL_BASED_OUTPATIENT_CLINIC_OR_DEPARTMENT_OTHER)
Admission: RE | Disposition: A | Discharge status: Home / Self Care | Payer: Self-pay | Source: Home / Self Care | Attending: Orthopedic Surgery

## 2022-09-26 ENCOUNTER — Ambulatory Visit (HOSPITAL_BASED_OUTPATIENT_CLINIC_OR_DEPARTMENT_OTHER)
Admission: RE | Admit: 2022-09-26 | Discharge: 2022-09-26 | Disposition: A | Discharge status: Home / Self Care | Payer: Medicare Other | Attending: Orthopedic Surgery | Admitting: Orthopedic Surgery

## 2022-09-26 ENCOUNTER — Other Ambulatory Visit: Payer: Self-pay

## 2022-09-26 DIAGNOSIS — Z01818 Encounter for other preprocedural examination: Secondary | ICD-10-CM

## 2022-09-26 DIAGNOSIS — Z79899 Other long term (current) drug therapy: Secondary | ICD-10-CM | POA: Insufficient documentation

## 2022-09-26 DIAGNOSIS — M67442 Ganglion, left hand: Secondary | ICD-10-CM

## 2022-09-26 DIAGNOSIS — Z853 Personal history of malignant neoplasm of breast: Secondary | ICD-10-CM | POA: Diagnosis not present

## 2022-09-26 DIAGNOSIS — M71342 Other bursal cyst, left hand: Secondary | ICD-10-CM | POA: Diagnosis not present

## 2022-09-26 DIAGNOSIS — C50919 Malignant neoplasm of unspecified site of unspecified female breast: Secondary | ICD-10-CM | POA: Diagnosis not present

## 2022-09-26 DIAGNOSIS — M151 Heberden's nodes (with arthropathy): Secondary | ICD-10-CM

## 2022-09-26 DIAGNOSIS — H57059 Tonic pupil, unspecified eye: Secondary | ICD-10-CM

## 2022-09-26 DIAGNOSIS — M19042 Primary osteoarthritis, left hand: Secondary | ICD-10-CM | POA: Insufficient documentation

## 2022-09-26 DIAGNOSIS — E785 Hyperlipidemia, unspecified: Secondary | ICD-10-CM | POA: Insufficient documentation

## 2022-09-26 DIAGNOSIS — L729 Follicular cyst of the skin and subcutaneous tissue, unspecified: Secondary | ICD-10-CM | POA: Diagnosis not present

## 2022-09-26 HISTORY — DX: Other specified postprocedural states: Z98.890

## 2022-09-26 HISTORY — DX: Unspecified osteoarthritis, unspecified site: M19.90

## 2022-09-26 HISTORY — PX: CYST EXCISION: SHX5701

## 2022-09-26 HISTORY — DX: Other specified postprocedural states: R11.2

## 2022-09-26 HISTORY — DX: Other complications of anesthesia, initial encounter: T88.59XA

## 2022-09-26 SURGERY — CYST REMOVAL
Anesthesia: Regional | Laterality: Left

## 2022-09-26 MED ORDER — LACTATED RINGERS IV SOLN
INTRAVENOUS | Status: DC
Start: 2022-09-26 — End: 2022-09-26

## 2022-09-26 MED ORDER — PROPOFOL 500 MG/50ML IV EMUL
INTRAVENOUS | Status: AC
  Filled 2022-09-26: qty 50

## 2022-09-26 MED ORDER — PROPOFOL 500 MG/50ML IV EMUL
INTRAVENOUS | Status: DC | PRN
  Administered 2022-09-26: 75 ug/kg/min via INTRAVENOUS

## 2022-09-26 MED ORDER — BUPIVACAINE HCL (PF) 0.25 % IJ SOLN
INTRAMUSCULAR | Status: DC | PRN
  Administered 2022-09-26: 9 mL

## 2022-09-26 MED ORDER — FENTANYL CITRATE (PF) 100 MCG/2ML IJ SOLN
INTRAMUSCULAR | Status: DC | PRN
  Administered 2022-09-26: 50 ug via INTRAVENOUS

## 2022-09-26 MED ORDER — OXYCODONE HCL 5 MG PO TABS: 5.0000 mg | ORAL_TABLET | Freq: Once | ORAL | Status: DC | PRN

## 2022-09-26 MED ORDER — CEFAZOLIN SODIUM-DEXTROSE 2-4 GM/100ML-% IV SOLN
INTRAVENOUS | Status: AC
  Filled 2022-09-26: qty 100

## 2022-09-26 MED ORDER — TRAMADOL HCL 50 MG PO TABS: ORAL_TABLET | ORAL | 0 refills | Status: AC

## 2022-09-26 MED ORDER — CEFAZOLIN SODIUM-DEXTROSE 2-4 GM/100ML-% IV SOLN
2.0000 g | INTRAVENOUS | Status: AC
  Administered 2022-09-26: 2 g via INTRAVENOUS

## 2022-09-26 MED ORDER — ONDANSETRON HCL 4 MG/2ML IJ SOLN
INTRAMUSCULAR | Status: DC | PRN
  Administered 2022-09-26: 4 mg via INTRAVENOUS

## 2022-09-26 MED ORDER — FENTANYL CITRATE (PF) 100 MCG/2ML IJ SOLN
INTRAMUSCULAR | Status: AC
  Filled 2022-09-26: qty 2

## 2022-09-26 MED ORDER — ACETAMINOPHEN 500 MG PO TABS
1000.0000 mg | ORAL_TABLET | Freq: Once | ORAL | Status: AC
  Administered 2022-09-26: 1000 mg via ORAL

## 2022-09-26 MED ORDER — PROPOFOL 10 MG/ML IV BOLUS
INTRAVENOUS | Status: DC | PRN
  Administered 2022-09-26: 20 mg via INTRAVENOUS
  Administered 2022-09-26: 10 mg via INTRAVENOUS

## 2022-09-26 MED ORDER — OXYCODONE HCL 5 MG/5ML PO SOLN: 5.0000 mg | Freq: Once | ORAL | Status: DC | PRN

## 2022-09-26 MED ORDER — ONDANSETRON HCL 4 MG/2ML IJ SOLN: 4.0000 mg | Freq: Once | INTRAMUSCULAR | Status: DC | PRN

## 2022-09-26 MED ORDER — ONDANSETRON HCL 4 MG/2ML IJ SOLN
INTRAMUSCULAR | Status: AC
  Filled 2022-09-26: qty 2

## 2022-09-26 MED ORDER — FENTANYL CITRATE (PF) 100 MCG/2ML IJ SOLN: 25.0000 ug | INTRAMUSCULAR | Status: DC | PRN

## 2022-09-26 MED ORDER — BUPIVACAINE HCL (PF) 0.25 % IJ SOLN
INTRAMUSCULAR | Status: AC
  Filled 2022-09-26: qty 30

## 2022-09-26 MED ORDER — ACETAMINOPHEN 500 MG PO TABS
ORAL_TABLET | ORAL | Status: AC
  Filled 2022-09-26: qty 2

## 2022-09-26 MED ORDER — LIDOCAINE HCL (PF) 0.5 % IJ SOLN
INTRAMUSCULAR | Status: DC | PRN
  Administered 2022-09-26: 30 mL via INTRAVENOUS

## 2022-09-26 SURGICAL SUPPLY — 54 items
APL PRP STRL LF DISP 70% ISPRP (MISCELLANEOUS) ×1
APL SKNCLS STERI-STRIP NONHPOA (GAUZE/BANDAGES/DRESSINGS)
BANDAGE GAUZE 1X75IN STRL (MISCELLANEOUS) IMPLANT
BENZOIN TINCTURE PRP APPL 2/3 (GAUZE/BANDAGES/DRESSINGS) IMPLANT
BLADE MINI RND TIP GREEN BEAV (BLADE) IMPLANT
BLADE SURG 15 STRL LF DISP TIS (BLADE) ×2 IMPLANT
BLADE SURG 15 STRL SS (BLADE) ×2
BNDG CMPR 5X2 CHSV 1 LYR STRL (GAUZE/BANDAGES/DRESSINGS)
BNDG CMPR 5X2 KNTD ELC UNQ LF (GAUZE/BANDAGES/DRESSINGS)
BNDG CMPR 5X3 KNIT ELC UNQ LF (GAUZE/BANDAGES/DRESSINGS)
BNDG CMPR 75X11 PLY HI ABS (MISCELLANEOUS)
BNDG CMPR 75X21 PLY HI ABS (MISCELLANEOUS)
BNDG CMPR 9X4 STRL LF SNTH (GAUZE/BANDAGES/DRESSINGS)
BNDG COHESIVE 1X5 TAN STRL LF (GAUZE/BANDAGES/DRESSINGS) IMPLANT
BNDG COHESIVE 2X5 TAN ST LF (GAUZE/BANDAGES/DRESSINGS) IMPLANT
BNDG ELASTIC 2INX 5YD STR LF (GAUZE/BANDAGES/DRESSINGS) IMPLANT
BNDG ELASTIC 3INX 5YD STR LF (GAUZE/BANDAGES/DRESSINGS) IMPLANT
BNDG ESMARK 4X9 LF (GAUZE/BANDAGES/DRESSINGS) IMPLANT
BNDG GAUZE 1X75IN STRL (MISCELLANEOUS)
BNDG GAUZE DERMACEA FLUFF 4 (GAUZE/BANDAGES/DRESSINGS) IMPLANT
BNDG GZE DERMACEA 4 6PLY (GAUZE/BANDAGES/DRESSINGS)
BNDG PLASTER X FAST 3X3 WHT LF (CAST SUPPLIES) IMPLANT
BNDG PLSTR 9X3 FST ST WHT (CAST SUPPLIES)
CHLORAPREP W/TINT 26 (MISCELLANEOUS) ×1 IMPLANT
CORD BIPOLAR FORCEPS 12FT (ELECTRODE) ×1 IMPLANT
COVER BACK TABLE 60X90IN (DRAPES) ×1 IMPLANT
COVER MAYO STAND STRL (DRAPES) ×1 IMPLANT
CUFF TOURN SGL QUICK 18X4 (TOURNIQUET CUFF) ×1 IMPLANT
DRAPE EXTREMITY T 121X128X90 (DISPOSABLE) ×1 IMPLANT
DRAPE SURG 17X23 STRL (DRAPES) ×1 IMPLANT
GAUZE SPONGE 4X4 12PLY STRL (GAUZE/BANDAGES/DRESSINGS) ×1 IMPLANT
GAUZE STRETCH 2X75IN STRL (MISCELLANEOUS) IMPLANT
GAUZE XEROFORM 1X8 LF (GAUZE/BANDAGES/DRESSINGS) ×1 IMPLANT
GLOVE BIO SURGEON STRL SZ7.5 (GLOVE) ×1 IMPLANT
GLOVE BIOGEL PI IND STRL 8 (GLOVE) ×1 IMPLANT
GOWN STRL REUS W/ TWL LRG LVL3 (GOWN DISPOSABLE) ×1 IMPLANT
GOWN STRL REUS W/TWL LRG LVL3 (GOWN DISPOSABLE) ×1
NDL HYPO 25X1 1.5 SAFETY (NEEDLE) ×1 IMPLANT
NEEDLE HYPO 25X1 1.5 SAFETY (NEEDLE) ×1 IMPLANT
NS IRRIG 1000ML POUR BTL (IV SOLUTION) ×1 IMPLANT
PACK BASIN DAY SURGERY FS (CUSTOM PROCEDURE TRAY) ×1 IMPLANT
PAD CAST 3X4 CTTN HI CHSV (CAST SUPPLIES) IMPLANT
PAD CAST 4YDX4 CTTN HI CHSV (CAST SUPPLIES) IMPLANT
PADDING CAST ABS COTTON 4X4 ST (CAST SUPPLIES) ×1 IMPLANT
PADDING CAST COTTON 3X4 STRL (CAST SUPPLIES)
PADDING CAST COTTON 4X4 STRL (CAST SUPPLIES)
STOCKINETTE 4X48 STRL (DRAPES) ×1 IMPLANT
STRIP CLOSURE SKIN 1/2X4 (GAUZE/BANDAGES/DRESSINGS) IMPLANT
SUT ETHILON 3 0 PS 1 (SUTURE) IMPLANT
SUT ETHILON 4 0 PS 2 18 (SUTURE) ×1 IMPLANT
SYR BULB EAR ULCER 3OZ GRN STR (SYRINGE) ×1 IMPLANT
SYR CONTROL 10ML LL (SYRINGE) ×1 IMPLANT
TOWEL GREEN STERILE FF (TOWEL DISPOSABLE) ×2 IMPLANT
UNDERPAD 30X36 HEAVY ABSORB (UNDERPADS AND DIAPERS) ×1 IMPLANT

## 2022-09-26 NOTE — Anesthesia Postprocedure Evaluation (Signed)
Anesthesia Post Note  Patient: TISHAWNA KIMBRELL  Procedure(s) Performed: EXCISION MUCOID CYST WITH DISTAL INTERPHALANGEAL JOINT ARTHROTOMY LEFT SMALL FINGER (Left)     Patient location during evaluation: PACU Anesthesia Type: Bier Block and MAC Level of consciousness: awake and alert Pain management: pain level controlled Vital Signs Assessment: post-procedure vital signs reviewed and stable Respiratory status: spontaneous breathing, nonlabored ventilation and respiratory function stable Cardiovascular status: stable and blood pressure returned to baseline Anesthetic complications: no   No notable events documented.  Last Vitals:  Vitals:   09/26/22 1400 09/26/22 1420  BP: 120/72 128/75  Pulse: 69 70  Resp: (!) 21 18  Temp:  (!) 36.2 C  SpO2: 100% 96%    Last Pain:  Vitals:   09/26/22 1420  TempSrc:   PainSc: 0-No pain                 Beryle Lathe

## 2022-09-26 NOTE — Discharge Instructions (Addendum)
[facility] Instructions ?Hand Surgery ? ?Wound Care: ?Keep your hand elevated above the level of your heart.  Do not allow it to dangle by your side.  Keep the dressing dry and do not remove it unless your doctor advises you to do so.  He will usually change it at the time of your post-op visit.  Moving your fingers is advised to stimulate circulation but will depend on the site of your surgery.  If you have a splint applied, your doctor will advise you regarding movement. ? ?Activity: ?Do not drive or operate machinery today.  Rest today and then you may return to your normal activity and work as indicated by your physician. ? ?Diet:  ?Drink liquids today or eat a light diet.  You may resume a regular diet tomorrow.   ? ?General expectations: ?Pain for two to three days. ?Fingers may become slightly swollen. ? ?Call your doctor if any of the following occur: ?Severe pain not relieved by pain medication. ?Elevated temperature. ?Dressing soaked with blood. ?Inability to move fingers. ?White or bluish color to fingers. ? ?You may have Tylenol again after 6pm, if needed. ? ? ?Post Anesthesia Home Care Instructions ? ?Activity: ?Get plenty of rest for the remainder of the day. A responsible individual must stay with you for 24 hours following the procedure.  ?For the next 24 hours, DO NOT: ?-Drive a car ?-Operate machinery ?-Drink alcoholic beverages ?-Take any medication unless instructed by your physician ?-Make any legal decisions or sign important papers. ? ?Meals: ?Start with liquid foods such as gelatin or soup. Progress to regular foods as tolerated. Avoid greasy, spicy, heavy foods. If nausea and/or vomiting occur, drink only clear liquids until the nausea and/or vomiting subsides. Call your physician if vomiting continues. ? ?Special Instructions/Symptoms: ?Your throat may feel dry or sore from the anesthesia or the breathing tube placed in your throat during surgery. If this causes discomfort, gargle with  warm salt water. The discomfort should disappear within 24 hours. ? ?If you had a scopolamine patch placed behind your ear for the management of post- operative nausea and/or vomiting: ? ?1. The medication in the patch is effective for 72 hours, after which it should be removed.  Wrap patch in a tissue and discard in the trash. Wash hands thoroughly with soap and water. ?2. You may remove the patch earlier than 72 hours if you experience unpleasant side effects which may include dry mouth, dizziness or visual disturbances. ?3. Avoid touching the patch. Wash your hands with soap and water after contact with the patch. ?    ? ?

## 2022-09-26 NOTE — Transfer of Care (Signed)
Immediate Anesthesia Transfer of Care Note  Patient: Diane Ingram  Procedure(s) Performed: EXCISION MUCOID CYST WITH DISTAL INTERPHALANGEAL JOINT ARTHROTOMY LEFT SMALL FINGER (Left)  Patient Location: PACU  Anesthesia Type:MAC and Bier block  Level of Consciousness: awake, alert , and oriented  Airway & Oxygen Therapy: Patient Spontanous Breathing and Patient connected to face mask oxygen  Post-op Assessment: Report given to RN and Post -op Vital signs reviewed and stable  Post vital signs: Reviewed and stable  Last Vitals:  Vitals Value Taken Time  BP 99/65 09/26/22 1351  Temp    Pulse 72 09/26/22 1351  Resp 18 09/26/22 1351  SpO2 100 % 09/26/22 1351  Vitals shown include unvalidated device data.  Last Pain:  Vitals:   09/26/22 1147  TempSrc: Oral  PainSc: 0-No pain         Complications: No notable events documented.

## 2022-09-26 NOTE — H&P (Signed)
  Diane Ingram is an 77 y.o. female.   Chief Complaint: mucoid cyst HPI: 77 yo female with mucoid cyst left small finger.  It is bothersome to her.  She wishes to have it removed and the dip joint debrided to try to prevent recurrence.  Allergies: No Known Allergies  Past Medical History:  Diagnosis Date   Arthritis    OA   Cancer (HCC) 08/2001   right Breast, bil mast   Complication of anesthesia    Diverticulosis    Hyperlipidemia    Leg pain, bilateral    PONV (postoperative nausea and vomiting)    Varicose veins     Past Surgical History:  Procedure Laterality Date   ABDOMINAL HYSTERECTOMY  2002   MASTECTOMY  09/18/01   bilateral    Family History: Family History  Problem Relation Age of Onset   Cancer Mother        breast   Cancer Father        melanoma    Social History:   reports that she has never smoked. She has never used smokeless tobacco. She reports that she does not drink alcohol and does not use drugs.  Medications: Medications Prior to Admission  Medication Sig Dispense Refill   atorvastatin (LIPITOR) 20 MG tablet Take 40 mg by mouth daily. Once daily  2   Calcium Carb-Cholecalciferol (CALCIUM 500 + D3 PO) Take by mouth.     raloxifene (EVISTA) 60 MG tablet TAKE 1 TABLET (60 MG TOTAL) BY MOUTH DAILY. 30 tablet 9    No results found for this or any previous visit (from the past 48 hour(s)).  No results found.    Blood pressure 120/78, pulse 72, temperature 97.7 F (36.5 C), temperature source Oral, resp. rate 14, height 5\' 2"  (1.575 m), weight 69.3 kg, SpO2 100 %.  General appearance: alert, cooperative, and appears stated age Head: Normocephalic, without obvious abnormality, atraumatic Neck: supple, symmetrical, trachea midline Extremities: Intact sensation and capillary refill all digits.  +epl/fpl/io.  No wounds.  Pulses: 2+ and symmetric Skin: Skin color, texture, turgor normal. 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  Non operative and operative treatment options have been discussed with the patient and patient wishes to proceed with operative treatment. Risks, benefits and alternatives of surgery were discussed including risks of blood loss, infection, damage to nerves/vessels/tendons/ligament/bone, failure of surgery, need for additional surgery, complication with wound healing, stiffness, recurrence.  She voiced understanding of these risks and elected to proceed.    Betha Loa 09/26/2022, 12:57 PM

## 2022-09-26 NOTE — Op Note (Signed)
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  Left small finger excision mucoid cyst 2.  Left small finger debridement of DIP joint   SURGEON:  Betha Loa, M.D.   ASSISTANT: none   ANESTHESIA:  Bier block with sedation   INTRAVENOUS FLUIDS:  Per anesthesia flow sheet.   ESTIMATED BLOOD LOSS:  Minimal.   COMPLICATIONS:  None.   SPECIMENS: Left small finger mucoid cyst to pathology   TOURNIQUET TIME:    Total Tourniquet Time Documented: Forearm (Left) - 23 minutes Total: Forearm (Left) - 23 minutes    DISPOSITION:  Stable to PACU.   INDICATIONS: Diane Ingram notes she has had a mass in the dorsum of her left small finger.  It is bothersome to her.  She wishes to have it removed and the DIP joint debrided to try to prevent recurrence.  Risks, benefits and alternatives of surgery were discussed including the risks of blood loss, infection, damage to nerves, vessels, tendons, ligaments, bone for surgery, need for additional surgery, complications with wound healing, continued pain, stiffness, , recurrence.  She voiced understanding of these risks and elected to proceed.  OPERATIVE COURSE:  After being identified preoperatively by myself,  the patient and I agreed on the procedure and site of the procedure.  The surgical site was marked.  Surgical consent had been signed. Preoperative IV antibiotic prophylaxis was given. She was transferred to the operating room and placed on the operating table in supine position with the Left upper extremity on an arm board.  Bier block anesthesia was induced by the anesthesiologist.  Left upper  extremity was prepped and draped in normal sterile orthopedic fashion.  A surgical pause was performed between the surgeons, anesthesia, and operating room staff and all were in agreement as to the patient, procedure, and site of procedure.  Tourniquet at the proximal aspect of the forearm had been inflated for the Bier block.  A hockey-stick shaped incision was made at the dorsum of the DIP joint of the small finger.  This was carried in subcutaneous tissues by spreading technique.  The cyst was identified.  Was freed up from surrounding tissues.  It was removed with the synovectomy rongeurs.  Some of the cyst was coming out from underneath the extensor tendon at the radial side.  This was removed as well.  This was sent to pathology for examination.  The DIP joint was entered underneath the extensor tendon.  The synovectomy rongeurs were used to debride the joint performing synovectomy as well as taking down prominent bone at both the radial and ulnar sides.  The wound and joint were copiously irrigated with sterile saline.  The wound was closed with 4-0 nylon in a horizontal mattress fashion.  There is a small open area where the cyst had thinned the skin.  This was left to granulate.  Digital block was performed with quarter percent plain Marcaine to aid in postoperative analgesia.  The wounds were dressed with sterile Xeroform 4 x 4 and wrapped with a Coban dressing lightly.  An AlumaFoam splint was placed and wrapped lightly with Coban dressing.  The tourniquet was deflated at 23 minutes.  Fingertips were pink with brisk capillary refill after deflation of tourniquet.  The operative  drapes were broken down.  The patient was awoken from anesthesia safely.  She was transferred back to the stretcher and taken to PACU in stable condition.  I will see her back in the office in 1 week for postoperative followup.  I will give her a prescription for Tramadol 50 mg 1 tab PO q6 hours prn pain, dispense # 20.   Betha Loa, MD Electronically signed, 09/26/22

## 2022-09-26 NOTE — Anesthesia Preprocedure Evaluation (Addendum)
Anesthesia Evaluation  Patient identified by MRN, date of birth, ID band Patient awake    Reviewed: Allergy & Precautions, NPO status , Patient's Chart, lab work & pertinent test results  History of Anesthesia Complications (+) PONV and history of anesthetic complications  Airway Mallampati: II  TM Distance: >3 FB Neck ROM: Full    Dental  (+) Dental Advisory Given   Pulmonary neg pulmonary ROS   Pulmonary exam normal        Cardiovascular negative cardio ROS Normal cardiovascular exam     Neuro/Psych  Adie's pupil   negative psych ROS   GI/Hepatic negative GI ROS, Neg liver ROS,,,  Endo/Other  negative endocrine ROS    Renal/GU negative Renal ROS     Musculoskeletal  (+) Arthritis ,    Abdominal   Peds  Hematology negative hematology ROS (+)   Anesthesia Other Findings   Reproductive/Obstetrics  Breast cancer                              Anesthesia Physical Anesthesia Plan  ASA: 2  Anesthesia Plan: Bier Block and MAC and Bier Block-Lidocaine Only   Post-op Pain Management: Tylenol PO (pre-op)*   Induction:   PONV Risk Score and Plan: 3 and Propofol infusion, Treatment may vary due to age or medical condition and Ondansetron  Airway Management Planned: Natural Airway and Simple Face Mask  Additional Equipment: None  Intra-op Plan:   Post-operative Plan:   Informed Consent: I have reviewed the patients History and Physical, chart, labs and discussed the procedure including the risks, benefits and alternatives for the proposed anesthesia with the patient or authorized representative who has indicated his/her understanding and acceptance.       Plan Discussed with: CRNA and Anesthesiologist  Anesthesia Plan Comments:        Anesthesia Quick Evaluation

## 2022-09-27 ENCOUNTER — Encounter (HOSPITAL_BASED_OUTPATIENT_CLINIC_OR_DEPARTMENT_OTHER): Payer: Self-pay | Admitting: Orthopedic Surgery

## 2022-09-27 LAB — SURGICAL PATHOLOGY

## 2022-11-02 DIAGNOSIS — H25043 Posterior subcapsular polar age-related cataract, bilateral: Secondary | ICD-10-CM | POA: Diagnosis not present

## 2022-11-02 DIAGNOSIS — H2512 Age-related nuclear cataract, left eye: Secondary | ICD-10-CM | POA: Diagnosis not present

## 2022-11-02 DIAGNOSIS — H25013 Cortical age-related cataract, bilateral: Secondary | ICD-10-CM | POA: Diagnosis not present

## 2022-11-02 DIAGNOSIS — H2513 Age-related nuclear cataract, bilateral: Secondary | ICD-10-CM | POA: Diagnosis not present

## 2022-11-16 ENCOUNTER — Other Ambulatory Visit (HOSPITAL_COMMUNITY): Payer: Self-pay | Admitting: Otolaryngology

## 2022-11-16 DIAGNOSIS — M79604 Pain in right leg: Secondary | ICD-10-CM

## 2022-11-16 DIAGNOSIS — M79661 Pain in right lower leg: Secondary | ICD-10-CM | POA: Diagnosis not present

## 2022-11-16 DIAGNOSIS — M25561 Pain in right knee: Secondary | ICD-10-CM | POA: Diagnosis not present

## 2022-11-16 DIAGNOSIS — M79605 Pain in left leg: Secondary | ICD-10-CM | POA: Diagnosis not present

## 2022-11-17 ENCOUNTER — Ambulatory Visit (HOSPITAL_COMMUNITY)
Admission: RE | Admit: 2022-11-17 | Discharge: 2022-11-17 | Disposition: A | Discharge status: Home / Self Care | Payer: Medicare Other | Source: Ambulatory Visit | Attending: Cardiovascular Disease | Admitting: Cardiovascular Disease

## 2022-11-17 DIAGNOSIS — M79604 Pain in right leg: Secondary | ICD-10-CM | POA: Diagnosis not present

## 2022-11-29 DIAGNOSIS — B078 Other viral warts: Secondary | ICD-10-CM | POA: Diagnosis not present

## 2022-11-29 DIAGNOSIS — L821 Other seborrheic keratosis: Secondary | ICD-10-CM | POA: Diagnosis not present

## 2022-11-29 DIAGNOSIS — L728 Other follicular cysts of the skin and subcutaneous tissue: Secondary | ICD-10-CM | POA: Diagnosis not present

## 2022-11-30 DIAGNOSIS — M25561 Pain in right knee: Secondary | ICD-10-CM | POA: Diagnosis not present

## 2023-01-23 ENCOUNTER — Other Ambulatory Visit: Payer: Self-pay | Admitting: Internal Medicine

## 2023-01-23 DIAGNOSIS — K7689 Other specified diseases of liver: Secondary | ICD-10-CM

## 2023-01-23 DIAGNOSIS — R911 Solitary pulmonary nodule: Secondary | ICD-10-CM

## 2023-01-23 DIAGNOSIS — N281 Cyst of kidney, acquired: Secondary | ICD-10-CM

## 2023-01-24 ENCOUNTER — Other Ambulatory Visit: Payer: Self-pay | Admitting: Internal Medicine

## 2023-01-24 DIAGNOSIS — N281 Cyst of kidney, acquired: Secondary | ICD-10-CM

## 2023-01-24 DIAGNOSIS — R911 Solitary pulmonary nodule: Secondary | ICD-10-CM

## 2023-01-24 DIAGNOSIS — K7689 Other specified diseases of liver: Secondary | ICD-10-CM

## 2023-01-30 ENCOUNTER — Other Ambulatory Visit: Payer: Self-pay | Admitting: Internal Medicine

## 2023-01-30 ENCOUNTER — Ambulatory Visit
Admission: RE | Admit: 2023-01-30 | Discharge: 2023-01-30 | Disposition: A | Discharge status: Home / Self Care | Payer: Medicare Other | Source: Ambulatory Visit | Attending: Internal Medicine | Admitting: Internal Medicine

## 2023-01-30 DIAGNOSIS — R911 Solitary pulmonary nodule: Secondary | ICD-10-CM

## 2023-01-30 DIAGNOSIS — N281 Cyst of kidney, acquired: Secondary | ICD-10-CM

## 2023-01-30 DIAGNOSIS — K7689 Other specified diseases of liver: Secondary | ICD-10-CM

## 2023-01-30 MED ORDER — IOPAMIDOL (ISOVUE-300) INJECTION 61%
100.0000 mL | Freq: Once | INTRAVENOUS | Status: AC | PRN
  Administered 2023-01-30: 100 mL via INTRAVENOUS

## 2023-01-31 DIAGNOSIS — M79645 Pain in left finger(s): Secondary | ICD-10-CM | POA: Diagnosis not present

## 2023-01-31 DIAGNOSIS — M19042 Primary osteoarthritis, left hand: Secondary | ICD-10-CM | POA: Diagnosis not present

## 2023-02-05 DIAGNOSIS — H2512 Age-related nuclear cataract, left eye: Secondary | ICD-10-CM | POA: Diagnosis not present

## 2023-02-06 DIAGNOSIS — H2511 Age-related nuclear cataract, right eye: Secondary | ICD-10-CM | POA: Diagnosis not present

## 2023-02-19 DIAGNOSIS — H52209 Unspecified astigmatism, unspecified eye: Secondary | ICD-10-CM | POA: Diagnosis not present

## 2023-02-19 DIAGNOSIS — H25041 Posterior subcapsular polar age-related cataract, right eye: Secondary | ICD-10-CM | POA: Diagnosis not present

## 2023-02-19 DIAGNOSIS — H2511 Age-related nuclear cataract, right eye: Secondary | ICD-10-CM | POA: Diagnosis not present

## 2023-02-19 DIAGNOSIS — H25011 Cortical age-related cataract, right eye: Secondary | ICD-10-CM | POA: Diagnosis not present

## 2023-02-22 DIAGNOSIS — S70362A Insect bite (nonvenomous), left thigh, initial encounter: Secondary | ICD-10-CM | POA: Diagnosis not present

## 2023-02-24 DIAGNOSIS — Z23 Encounter for immunization: Secondary | ICD-10-CM | POA: Diagnosis not present

## 2023-03-19 DIAGNOSIS — I739 Peripheral vascular disease, unspecified: Secondary | ICD-10-CM | POA: Diagnosis not present

## 2023-04-26 DIAGNOSIS — B351 Tinea unguium: Secondary | ICD-10-CM | POA: Diagnosis not present

## 2023-04-26 DIAGNOSIS — B070 Plantar wart: Secondary | ICD-10-CM | POA: Diagnosis not present

## 2023-05-09 DIAGNOSIS — J0100 Acute maxillary sinusitis, unspecified: Secondary | ICD-10-CM | POA: Diagnosis not present

## 2023-05-09 DIAGNOSIS — J029 Acute pharyngitis, unspecified: Secondary | ICD-10-CM | POA: Diagnosis not present

## 2023-07-13 DIAGNOSIS — Z1389 Encounter for screening for other disorder: Secondary | ICD-10-CM | POA: Diagnosis not present

## 2023-07-13 DIAGNOSIS — I872 Venous insufficiency (chronic) (peripheral): Secondary | ICD-10-CM | POA: Diagnosis not present

## 2023-07-13 DIAGNOSIS — R350 Frequency of micturition: Secondary | ICD-10-CM | POA: Diagnosis not present

## 2023-07-13 DIAGNOSIS — M79671 Pain in right foot: Secondary | ICD-10-CM | POA: Diagnosis not present

## 2023-07-19 DIAGNOSIS — X32XXXD Exposure to sunlight, subsequent encounter: Secondary | ICD-10-CM | POA: Diagnosis not present

## 2023-07-19 DIAGNOSIS — I872 Venous insufficiency (chronic) (peripheral): Secondary | ICD-10-CM | POA: Diagnosis not present

## 2023-07-19 DIAGNOSIS — D225 Melanocytic nevi of trunk: Secondary | ICD-10-CM | POA: Diagnosis not present

## 2023-07-19 DIAGNOSIS — L570 Actinic keratosis: Secondary | ICD-10-CM | POA: Diagnosis not present

## 2023-07-19 DIAGNOSIS — Z1283 Encounter for screening for malignant neoplasm of skin: Secondary | ICD-10-CM | POA: Diagnosis not present

## 2023-07-19 DIAGNOSIS — L820 Inflamed seborrheic keratosis: Secondary | ICD-10-CM | POA: Diagnosis not present

## 2023-07-30 ENCOUNTER — Other Ambulatory Visit: Payer: Self-pay | Admitting: *Deleted

## 2023-07-30 DIAGNOSIS — I83813 Varicose veins of bilateral lower extremities with pain: Secondary | ICD-10-CM

## 2023-07-30 DIAGNOSIS — I872 Venous insufficiency (chronic) (peripheral): Secondary | ICD-10-CM

## 2023-07-30 DIAGNOSIS — M79672 Pain in left foot: Secondary | ICD-10-CM | POA: Diagnosis not present

## 2023-08-01 ENCOUNTER — Ambulatory Visit (HOSPITAL_COMMUNITY)
Admission: RE | Admit: 2023-08-01 | Discharge: 2023-08-01 | Disposition: A | Discharge status: Home / Self Care | Payer: Medicare Other | Source: Ambulatory Visit | Attending: Vascular Surgery | Admitting: Vascular Surgery

## 2023-08-01 DIAGNOSIS — I872 Venous insufficiency (chronic) (peripheral): Secondary | ICD-10-CM | POA: Insufficient documentation

## 2023-08-01 DIAGNOSIS — I83813 Varicose veins of bilateral lower extremities with pain: Secondary | ICD-10-CM | POA: Insufficient documentation

## 2023-08-03 DIAGNOSIS — E559 Vitamin D deficiency, unspecified: Secondary | ICD-10-CM | POA: Diagnosis not present

## 2023-08-03 DIAGNOSIS — E785 Hyperlipidemia, unspecified: Secondary | ICD-10-CM | POA: Diagnosis not present

## 2023-08-10 ENCOUNTER — Ambulatory Visit: Payer: Medicare Other | Admitting: Physician Assistant

## 2023-08-10 VITALS — BP 116/79 | HR 71 | Temp 98.1°F | Ht 63.0 in | Wt 156.2 lb

## 2023-08-10 DIAGNOSIS — I872 Venous insufficiency (chronic) (peripheral): Secondary | ICD-10-CM

## 2023-08-10 DIAGNOSIS — M7989 Other specified soft tissue disorders: Secondary | ICD-10-CM

## 2023-08-10 NOTE — Progress Notes (Addendum)
 Office Note  History of Present Illness   Diane Ingram is a 78 y.o. (April 17, 1946) female who presents for repeat venous insufficiency evaluation.  She has previously been evaluated for lower extremity swelling at our office in 2021.  At that time, her symptoms were mild/moderate and she was not regularly using conservative therapy for treatment.  She returns today for repeat evaluation.  She says that she still has mild to moderate lower extremity swelling at baseline.  This is typically not very bothersome to her.  She states she is more worried about some increase in right foot swelling she had several weeks ago.  She says that the swelling happened fairly suddenly, however it was not painful to her.  She said she had to elevate her legs a lot to get the swelling to go down.  She was also placed on a course of oral prednisone by another provider, which did help with her swelling.  Now her swelling in her legs has returned back to baseline, which is still usually worse after prolonged periods on her feet.  She denies any pain in her feet or legs.  She says at baseline she does have some redness discoloration around her right ankle.  She does not wear compression stockings.  She elevates her legs intermittently.  She has no prior history of DVT.  She does have a history of laser ablation of the right greater saphenous vein by [facility].  Past Medical History:  Diagnosis Date   Arthritis    OA   Cancer (HCC) 08/2001   right Breast, bil mast   Complication of anesthesia    Diverticulosis    Hyperlipidemia    Leg pain, bilateral    PONV (postoperative nausea and vomiting)    Varicose veins     Past Surgical History:  Procedure Laterality Date   ABDOMINAL HYSTERECTOMY  2002   CYST EXCISION Left 09/26/2022   Procedure: EXCISION MUCOID CYST WITH DISTAL INTERPHALANGEAL JOINT ARTHROTOMY LEFT SMALL FINGER;  Surgeon: Betha Loa, MD;  Location: [facility];  Service:  Orthopedics;  Laterality: Left;  45 MIN   MASTECTOMY  09/18/01   bilateral    Social History   Socioeconomic History   Marital status: Divorced    Spouse name: Not on file   Number of children: 2   Years of education: [employer]   Highest education level: Not on file  Occupational History   Occupation: INSURANCE MGR    Employer: [employer]  Tobacco Use   Smoking status: Never   Smokeless tobacco: Never  Substance and Sexual Activity   Alcohol use: No    Alcohol/week: 0.0 standard drinks of alcohol   Drug use: No   Sexual activity: Not Currently    Birth control/protection: Surgical    Comment: hyst  Other Topics Concern   Not on file  Social History Narrative   Pt lives at home alone.   She does use caffeine.   Social Drivers of Corporate investment banker Strain: Not on file  Food Insecurity: Not on file  Transportation Needs: Not on file  Physical Activity: Not on file  Stress: Not on file  Social Connections: Not on file  Intimate Partner Violence: Not on file    Family History  Problem Relation Age of Onset   Cancer Mother        breast   Cancer Father  melanoma    Current Outpatient Medications  Medication Sig Dispense Refill   atorvastatin (LIPITOR) 20 MG tablet Take 40 mg by mouth daily. Once daily  2   Calcium Carb-Cholecalciferol (CALCIUM 500 + D3 PO) Take by mouth.     raloxifene (EVISTA) 60 MG tablet TAKE 1 TABLET (60 MG TOTAL) BY MOUTH DAILY. 30 tablet 9   traMADol (ULTRAM) 50 MG tablet 1 tab PO q6 hours prn pain 20 tablet 0   No current facility-administered medications for this visit.    No Known Allergies  REVIEW OF SYSTEMS (negative unless checked):   Cardiac:  []  Chest pain or chest pressure? []  Shortness of breath upon activity? []  Shortness of breath when lying flat? []  Irregular heart rhythm?  Vascular:  []  Pain in calf, thigh, or hip brought on by walking? []  Pain in feet at night that wakes you up from your  sleep? []  Blood clot in your veins? [x]  Leg swelling?  Pulmonary:  []  Oxygen at home? []  Productive cough? []  Wheezing?  Neurologic:  []  Sudden weakness in arms or legs? []  Sudden numbness in arms or legs? []  Sudden onset of difficult speaking or slurred speech? []  Temporary loss of vision in one eye? []  Problems with dizziness?  Gastrointestinal:  []  Blood in stool? []  Vomited blood?  Genitourinary:  []  Burning when urinating? []  Blood in urine?  Psychiatric:  []  Major depression  Hematologic:  []  Bleeding problems? []  Problems with blood clotting?  Dermatologic:  []  Rashes or ulcers?  Constitutional:  []  Fever or chills?  Ear/Nose/Throat:  []  Change in hearing? []  Nose bleeds? []  Sore throat?  Musculoskeletal:  []  Back pain? []  Joint pain? []  Muscle pain?   Physical Examination     Vitals:   08/10/23 0822  BP: 116/79  Pulse: 71  Temp: 98.1 F (36.7 C)  TempSrc: Temporal  SpO2: 93%  Weight: 156 lb 3.2 oz (70.9 kg)  Height: 5\' 3"  (1.6 m)   Body mass index is 27.67 kg/m.  General:  WDWN in NAD; vital signs documented above Gait: Not observed HENT: WNL, normocephalic Pulmonary: normal non-labored breathing , without Rales, rhonchi,  wheezing Cardiac: regular HR Abdomen: soft, NT, no masses Skin: without rashes Vascular Exam/Pulses: Nonpalpable pedal pulses.  Brisk DP/PT Doppler signals bilaterally Extremities: Small varicose veins around the right knee.  Scattered reticular veins on both legs.  1+ edema of bilateral ankles.  Mild stasis pigmentation bilaterally, right greater than left.  No venous ulcerations Musculoskeletal: no muscle wasting or atrophy  Neurologic: A&O X 3;  No focal weakness or paresthesias are detected Psychiatric:  The pt has Normal affect.  Non-invasive Vascular Imaging   RLE Venous Insufficiency Duplex (08/01/2023):  RIGHT             Reflux NoRefluxReflux TimeDiameter cmsComments                                         Yes                                             +------------------+---------+------+-----------+------------+-------------  ----+  CFV              no                                                        +------------------+---------+------+-----------+------------+-------------  ----+  FV prox           no                                                        +------------------+---------+------+-----------+------------+-------------  ----+  FV mid            no                                                        +------------------+---------+------+-----------+------------+-------------  ----+  Popliteal                  yes   >1 second                                 +------------------+---------+------+-----------+------------+-------------  ----+  GSV at [facility]        no                           0.467                        +------------------+---------+------+-----------+------------+-------------  ----+  GSV prox thigh                                          prior                                                                        ablation/strippin                                                         g                   +------------------+---------+------+-----------+------------+-------------  ----+  GSV mid thigh                                           prior                                                                        ablation/strippin  g                   +------------------+---------+------+-----------+------------+-------------  ----+  GSV dist thigh                                          prior                                                                        ablation/strippin                                                         g                    +------------------+---------+------+-----------+------------+-------------  ----+  GSV at knee                                             prior                                                                        ablation/strippin                                                         g                   +------------------+---------+------+-----------+------------+-------------  ----+  SSV Pop Fossa     no                           0.398                        +------------------+---------+------+-----------+------------+-------------  ----+  SSV prox calf               yes    >500 ms     0.393                        +------------------+---------+------+-----------+------------+-------------  ----+  SSV mid calf                yes    >500 ms     0.393                        +------------------+---------+------+-----------+------------+-------------  ----+  AASV prox thigh  yes    >500 ms     0.484                        +------------------+---------+------+-----------+------------+-------------  ----+  AASV prox/mid               yes    >500 ms     0.457                        thigh                                                                       +------------------+---------+------+-----------+------------+-------------  ----+  AASV distally                                           out of  fascia and                                                          tortuous            +------------------+---------+------+-----------+------------+-------------  ----+    Medical Decision Making   Diane Ingram is a 78 y.o. female who presents for venous insufficiency evaluation  -Based on the patient's duplex, there is reflux in the right popliteal vein, small saphenous vein, and anterior accessory saphenous vein.  Duplex demonstrates previous ablation of the right greater  saphenous vein.  The rest of the deep and superficial venous system is competent.  There is no evidence of DVT on exam -The patient has a chronic history of bilateral lower extremity swelling, right greater than left.  She does have a history of right greater saphenous vein ablation.  She has previously been seen by our office and has been recommended to wear compression stockings and elevate her legs to help with swelling -She states a couple of weeks ago she had a sudden increase in right foot swelling.  She said she had to elevate her legs frequently to get the swelling to go down.  Now her foot swelling has gone back to baseline.  She does have mild/moderate ankle swelling at baseline.  She does not wear compression stockings and only intermittently elevates her legs -On exam she has 1+ edema of bilateral lower extremities.  She does have some venous stasis changes around both ankles, which is greater on the right.  She has no foot swelling. -I have explained to the patient that her symptoms would be best treated with proper conservative therapy, which she has been educated on before.  I have recommended that she wear compression stockings daily and regularly elevate her legs.  At this time, I would not pursue ablation of her small saphenous or anterior accessory saphenous, given that her swelling is mild and does not cause her any discomfort.  I believe that her swelling can be well-controlled with conservative therapy only. -She was measured for and given a  pair of 15 to 20 mmHg compression stockings and 20 to 30 mmHg compression stockings.  I have recommended that she wears 1 of these pairs daily and elevate her legs for leg swelling control.  I have also recommended that she decrease any prolonged sitting and standing.  In the future, if her swelling worsens and causes significant discomfort and is not controlled by conservative therapy, she could be a candidate for SSV or AASV stripping/ablation -She  can follow-up with our office as needed   Ernestene Mention, PA-C [facility] Office: [phone]  08/11/2023, 5:37 PM  Call MD: Randie Heinz

## 2023-08-15 DIAGNOSIS — M7989 Other specified soft tissue disorders: Secondary | ICD-10-CM

## 2023-08-15 DIAGNOSIS — C50919 Malignant neoplasm of unspecified site of unspecified female breast: Secondary | ICD-10-CM | POA: Diagnosis not present

## 2023-08-15 DIAGNOSIS — Z0000 Encounter for general adult medical examination without abnormal findings: Secondary | ICD-10-CM | POA: Diagnosis not present

## 2023-08-15 DIAGNOSIS — Z1339 Encounter for screening examination for other mental health and behavioral disorders: Secondary | ICD-10-CM | POA: Diagnosis not present

## 2023-08-15 DIAGNOSIS — Z1331 Encounter for screening for depression: Secondary | ICD-10-CM | POA: Diagnosis not present

## 2023-09-10 DIAGNOSIS — M1711 Unilateral primary osteoarthritis, right knee: Secondary | ICD-10-CM | POA: Diagnosis not present

## 2023-09-24 DIAGNOSIS — M859 Disorder of bone density and structure, unspecified: Secondary | ICD-10-CM | POA: Diagnosis not present

## 2023-10-17 DIAGNOSIS — L570 Actinic keratosis: Secondary | ICD-10-CM | POA: Diagnosis not present

## 2023-10-17 DIAGNOSIS — L718 Other rosacea: Secondary | ICD-10-CM | POA: Diagnosis not present

## 2023-10-17 DIAGNOSIS — L820 Inflamed seborrheic keratosis: Secondary | ICD-10-CM | POA: Diagnosis not present

## 2023-10-17 DIAGNOSIS — L821 Other seborrheic keratosis: Secondary | ICD-10-CM | POA: Diagnosis not present

## 2023-10-17 DIAGNOSIS — X32XXXD Exposure to sunlight, subsequent encounter: Secondary | ICD-10-CM | POA: Diagnosis not present

## 2023-10-17 DIAGNOSIS — B078 Other viral warts: Secondary | ICD-10-CM | POA: Diagnosis not present

## 2023-11-13 DIAGNOSIS — K080 Exfoliation of teeth due to systemic causes: Secondary | ICD-10-CM | POA: Diagnosis not present

## 2024-01-25 ENCOUNTER — Other Ambulatory Visit: Payer: Self-pay | Admitting: Internal Medicine

## 2024-01-25 DIAGNOSIS — R911 Solitary pulmonary nodule: Secondary | ICD-10-CM

## 2024-01-28 ENCOUNTER — Other Ambulatory Visit

## 2024-01-30 ENCOUNTER — Other Ambulatory Visit: Payer: Self-pay | Admitting: Internal Medicine

## 2024-01-30 DIAGNOSIS — R911 Solitary pulmonary nodule: Secondary | ICD-10-CM

## 2024-01-30 DIAGNOSIS — N281 Cyst of kidney, acquired: Secondary | ICD-10-CM

## 2024-02-05 ENCOUNTER — Other Ambulatory Visit: Payer: Self-pay | Admitting: Internal Medicine

## 2024-02-05 ENCOUNTER — Ambulatory Visit
Admission: RE | Admit: 2024-02-05 | Discharge: 2024-02-05 | Disposition: A | Discharge status: Home / Self Care | Source: Ambulatory Visit | Attending: Internal Medicine | Admitting: Internal Medicine

## 2024-02-05 DIAGNOSIS — R911 Solitary pulmonary nodule: Secondary | ICD-10-CM

## 2024-02-05 DIAGNOSIS — N281 Cyst of kidney, acquired: Secondary | ICD-10-CM

## 2024-02-05 DIAGNOSIS — K449 Diaphragmatic hernia without obstruction or gangrene: Secondary | ICD-10-CM | POA: Diagnosis not present

## 2024-02-05 DIAGNOSIS — K5730 Diverticulosis of large intestine without perforation or abscess without bleeding: Secondary | ICD-10-CM | POA: Diagnosis not present

## 2024-02-05 MED ORDER — IOPAMIDOL (ISOVUE-300) INJECTION 61%
100.0000 mL | Freq: Once | INTRAVENOUS | Status: AC | PRN
  Administered 2024-02-05: 100 mL via INTRAVENOUS

## 2024-02-06 DIAGNOSIS — Z1283 Encounter for screening for malignant neoplasm of skin: Secondary | ICD-10-CM | POA: Diagnosis not present

## 2024-02-06 DIAGNOSIS — D225 Melanocytic nevi of trunk: Secondary | ICD-10-CM | POA: Diagnosis not present

## 2024-02-06 DIAGNOSIS — L820 Inflamed seborrheic keratosis: Secondary | ICD-10-CM | POA: Diagnosis not present

## 2024-02-06 DIAGNOSIS — X32XXXD Exposure to sunlight, subsequent encounter: Secondary | ICD-10-CM | POA: Diagnosis not present

## 2024-02-06 DIAGNOSIS — L570 Actinic keratosis: Secondary | ICD-10-CM | POA: Diagnosis not present

## 2024-02-27 DIAGNOSIS — Z23 Encounter for immunization: Secondary | ICD-10-CM | POA: Diagnosis not present

## 2024-03-12 DIAGNOSIS — R0981 Nasal congestion: Secondary | ICD-10-CM | POA: Diagnosis not present

## 2024-03-12 DIAGNOSIS — U071 COVID-19: Secondary | ICD-10-CM | POA: Diagnosis not present

## 2024-04-02 DIAGNOSIS — C50919 Malignant neoplasm of unspecified site of unspecified female breast: Secondary | ICD-10-CM | POA: Diagnosis not present

## 2024-04-07 DIAGNOSIS — M79661 Pain in right lower leg: Secondary | ICD-10-CM | POA: Diagnosis not present

## 2024-04-22 DIAGNOSIS — H43813 Vitreous degeneration, bilateral: Secondary | ICD-10-CM | POA: Diagnosis not present
